# Patient Record
Sex: Female | Born: 1935
Health system: Southern US, Community
[De-identification: ages and names within clinical notes are randomized; demographics above are authoritative.]

## PROBLEM LIST (undated history)

## (undated) DIAGNOSIS — E785 Hyperlipidemia, unspecified: Secondary | ICD-10-CM

## (undated) DIAGNOSIS — I480 Paroxysmal atrial fibrillation: Secondary | ICD-10-CM

## (undated) DIAGNOSIS — I213 ST elevation (STEMI) myocardial infarction of unspecified site: Secondary | ICD-10-CM

## (undated) DIAGNOSIS — I2510 Atherosclerotic heart disease of native coronary artery without angina pectoris: Secondary | ICD-10-CM

## (undated) DIAGNOSIS — M1990 Unspecified osteoarthritis, unspecified site: Secondary | ICD-10-CM

## (undated) DIAGNOSIS — I509 Heart failure, unspecified: Secondary | ICD-10-CM

## (undated) DIAGNOSIS — I5021 Acute systolic (congestive) heart failure: Secondary | ICD-10-CM

## (undated) HISTORY — PX: CHOLECYSTECTOMY: SHX55

## (undated) HISTORY — PX: HERNIA REPAIR: SHX51

## (undated) HISTORY — PX: TUBAL LIGATION: SHX77

---

## 1998-05-13 ENCOUNTER — Other Ambulatory Visit: Admission: RE | Admit: 1998-05-13 | Discharge: 1998-05-13 | Payer: Self-pay | Admitting: *Deleted

## 1998-05-13 ENCOUNTER — Ambulatory Visit (HOSPITAL_COMMUNITY): Admission: RE | Admit: 1998-05-13 | Discharge: 1998-05-13 | Payer: Self-pay | Admitting: *Deleted

## 1999-05-05 ENCOUNTER — Ambulatory Visit (HOSPITAL_COMMUNITY): Admission: RE | Admit: 1999-05-05 | Discharge: 1999-05-05 | Payer: Self-pay | Admitting: *Deleted

## 2000-05-10 ENCOUNTER — Ambulatory Visit (HOSPITAL_COMMUNITY): Admission: RE | Admit: 2000-05-10 | Discharge: 2000-05-10 | Payer: Self-pay | Admitting: Family Medicine

## 2001-05-16 ENCOUNTER — Ambulatory Visit (HOSPITAL_COMMUNITY): Admission: RE | Admit: 2001-05-16 | Discharge: 2001-05-16 | Payer: Self-pay | Admitting: Obstetrics & Gynecology

## 2002-05-29 ENCOUNTER — Ambulatory Visit (HOSPITAL_COMMUNITY): Admission: RE | Admit: 2002-05-29 | Discharge: 2002-05-29 | Payer: Self-pay | Admitting: Family Medicine

## 2010-08-17 ENCOUNTER — Emergency Department (HOSPITAL_BASED_OUTPATIENT_CLINIC_OR_DEPARTMENT_OTHER): Admission: EM | Admit: 2010-08-17 | Payer: Self-pay | Source: Home / Self Care

## 2010-08-22 ENCOUNTER — Other Ambulatory Visit: Payer: Self-pay | Admitting: Surgery

## 2010-08-28 ENCOUNTER — Inpatient Hospital Stay: Admission: RE | Admit: 2010-08-28 | Payer: Self-pay | Source: Ambulatory Visit

## 2010-08-28 ENCOUNTER — Ambulatory Visit
Admission: RE | Admit: 2010-08-28 | Discharge: 2010-08-28 | Disposition: A | Discharge status: Home / Self Care | Payer: Medicare Other | Source: Ambulatory Visit | Attending: Surgery | Admitting: Surgery

## 2010-08-28 MED ORDER — IOHEXOL 300 MG/ML  SOLN
125.0000 mL | Freq: Once | INTRAMUSCULAR | Status: AC | PRN
  Administered 2010-08-28: 125 mL via INTRAVENOUS

## 2010-08-29 ENCOUNTER — Other Ambulatory Visit: Payer: Self-pay | Admitting: Surgery

## 2010-08-29 DIAGNOSIS — R918 Other nonspecific abnormal finding of lung field: Secondary | ICD-10-CM

## 2010-08-30 ENCOUNTER — Ambulatory Visit
Admission: RE | Admit: 2010-08-30 | Discharge: 2010-08-30 | Disposition: A | Discharge status: Home / Self Care | Payer: Medicare Other | Source: Ambulatory Visit | Attending: Surgery | Admitting: Surgery

## 2010-08-30 ENCOUNTER — Other Ambulatory Visit: Payer: Medicare Other

## 2010-08-30 DIAGNOSIS — R918 Other nonspecific abnormal finding of lung field: Secondary | ICD-10-CM

## 2010-08-30 MED ORDER — IOHEXOL 300 MG/ML  SOLN
75.0000 mL | Freq: Once | INTRAMUSCULAR | Status: AC | PRN
  Administered 2010-08-30: 75 mL via INTRAVENOUS

## 2010-10-23 ENCOUNTER — Other Ambulatory Visit: Payer: Self-pay | Admitting: Surgery

## 2010-10-23 ENCOUNTER — Encounter (HOSPITAL_COMMUNITY): Payer: Medicare Other | Attending: Surgery

## 2010-10-23 DIAGNOSIS — Z01812 Encounter for preprocedural laboratory examination: Secondary | ICD-10-CM | POA: Insufficient documentation

## 2010-10-23 DIAGNOSIS — K4190 Unilateral femoral hernia, without obstruction or gangrene, not specified as recurrent: Secondary | ICD-10-CM | POA: Insufficient documentation

## 2010-10-23 DIAGNOSIS — K439 Ventral hernia without obstruction or gangrene: Secondary | ICD-10-CM | POA: Insufficient documentation

## 2010-10-23 LAB — CBC
HCT: 44.5 % (ref 36.0–46.0)
MCH: 30.2 pg (ref 26.0–34.0)
MCV: 91.9 fL (ref 78.0–100.0)
Platelets: 238 10*3/uL (ref 150–400)
RBC: 4.84 MIL/uL (ref 3.87–5.11)
WBC: 7.2 10*3/uL (ref 4.0–10.5)

## 2010-10-23 LAB — DIFFERENTIAL
Eosinophils Absolute: 0.2 10*3/uL (ref 0.0–0.7)
Eosinophils Relative: 3 % (ref 0–5)
Lymphocytes Relative: 34 % (ref 12–46)
Lymphs Abs: 2.5 10*3/uL (ref 0.7–4.0)
Monocytes Absolute: 0.7 10*3/uL (ref 0.1–1.0)
Monocytes Relative: 10 % (ref 3–12)

## 2010-10-23 LAB — BASIC METABOLIC PANEL
BUN: 15 mg/dL (ref 6–23)
CO2: 29 mEq/L (ref 19–32)
Calcium: 9.3 mg/dL (ref 8.4–10.5)
Chloride: 107 mEq/L (ref 96–112)
Creatinine, Ser: 0.82 mg/dL (ref 0.4–1.2)
GFR calc Af Amer: >60 mL/min (ref >60)
Glucose, Bld: 82 mg/dL (ref 70–99)

## 2010-10-23 LAB — SURGICAL PCR SCREEN: MRSA, PCR: NEGATIVE

## 2010-11-03 ENCOUNTER — Inpatient Hospital Stay (HOSPITAL_COMMUNITY)
Admission: RE | Admit: 2010-11-03 | Discharge: 2010-11-07 | DRG: 352 | Disposition: A | Discharge status: Home / Self Care | Payer: Medicare Other | Source: Ambulatory Visit | Attending: Surgery | Admitting: Surgery

## 2010-11-03 DIAGNOSIS — K4091 Unilateral inguinal hernia, without obstruction or gangrene, recurrent: Secondary | ICD-10-CM | POA: Diagnosis present

## 2010-11-03 DIAGNOSIS — E669 Obesity, unspecified: Secondary | ICD-10-CM | POA: Diagnosis present

## 2010-11-03 DIAGNOSIS — G8918 Other acute postprocedural pain: Secondary | ICD-10-CM | POA: Diagnosis not present

## 2010-11-03 DIAGNOSIS — Z01812 Encounter for preprocedural laboratory examination: Secondary | ICD-10-CM

## 2010-11-03 DIAGNOSIS — K439 Ventral hernia without obstruction or gangrene: Principal | ICD-10-CM | POA: Diagnosis present

## 2010-11-03 NOTE — Op Note (Signed)
NAMESHAKEISHA, HORINE                ACCOUNT NO.:  [id number]  MEDICAL RECORD NO.:  [id number]           PATIENT TYPE:  I  LOCATION:  0002                         FACILITY:  [facility]  PHYSICIAN:  Wilmon Arms. Corliss Skains, M.D. DATE OF BIRTH:  1934/05/11  DATE OF PROCEDURE:  11/03/2010 DATE OF DISCHARGE:                              OPERATIVE REPORT   PREOPERATIVE DIAGNOSES: 1. Ventral hernias x2. 2. Left femoral hernia.  POSTOPERATIVE DIAGNOSES: 1. Ventral hernias x2. 2. Left direct inguinal hernia.  PROCEDURE:  Laparoscopic ventral hernia repair with mesh and a open left inguinal hernia repair with mesh.  SURGEON:  Wilmon Arms. Bryana Froemming, M.D.  ANESTHESIA:  General  INDICATIONS:  This is an otherwise healthy 77 year old lady who developed a mass in her epigastrium several years ago.  This has become fairly large and occasional uncomfortable.  She has also had some pain in her groin.  I could palpate a ventral hernia as well as a recurrence of a previous umbilical incision for tubal ligation.  I could not palpate any type of inguinal hernia, however.  A CT scan was obtained, which showed an epigastric ventral hernia, containing omental fat.  She also had a recurrent umbilical hernia containing fat.  She also had what was read as a left femoral hernia.  We recommended repair of all these hernias.  DESCRIPTION OF PROCEDURE:  The patient was brought to the operating room and placed in supine position on operating room table.  After an adequate level of general anesthesia was obtained, a Foley catheter was placed under sterile technique.  The patient's abdomen was prepped with ChloraPrep and her groin was prepped with Betadine and draped in sterile fashion.  Time-out was taken to identify proper patient, proper procedure.  We infiltrated the area below the left costal margin with 0.25% Marcaine with epinephrine.  5-mm OptiVu trocar was used to cannulate the peritoneal cavity.  We insufflated  CO2, maintaining maximal pressure of 15 mmHg.  The patient has minimal adhesions to the anterior abdominal wall.  An 11-mm port was placed in the left anterior axillary line at the level of the umbilicus.  Another 5-mm port was placed in left lower quadrant.  We first examined the epigastric hernia. This hernia defect was quite small, was right at the lower end of the falciform ligament.  However, this seemed to contain a lot of preperitoneal fat.  The umbilical defect also seemed fairly small and seems to contain a fair amount of preperitoneal fat as well.  No small bowel or omentum was actually involved with this area.  We placed the patient in Trendelenburg and examined her left groin.  She does not have a femoral hernia.  She seems to have a large direct inguinal hernia defect.  Due to the patient's body habitus and the location of her hernia, I made the decision to proceed with open repair after the laparoscopic ventral hernia repair.  We began working on the epigastric defect.  Using gentle traction as well as pressure from the external abdomen, we were able to reduce a large amount of preperitoneal fat out of the hernia  defect.  We cleared the falciform ligament away from this defect using harmonic scalpel. The defect only seemed to be about a centimeter and a half across but the hernia sac was about 4.5 to 5 cm across.  We then turned our attention to the umbilical defect.  We reduced a moderate amount of preperitoneal fat out of this hernia as well.  The hernia defect was about 2 cm across.  We measured the distance between these two defects in the midline and added several centimeters overlap each direction.  I then selected a 15 x 20 cm sheet of Physiomesh.  I trimmed this down so it was about 12 x 20 cm.  We placed 6 stay sutures of 0 Novafil around the edges of the mesh.  We rolled the mesh, inserted through the 11-mm trocar.  We unrolled the mesh and used the Endoclose  device to pull up the stay sutures.  This provided good coverage of both defects with good overlap.  The stay sutures were tied down.  Securestrap device was used to attach the mesh circumferentially.  Hemostasis was good.  We removed 11-mm trocar and closed the fascia with a 0 Vicryl.  We then removed our trocars as pneumoperitoneum was released.  We then turned our attention to left groin.  I made an oblique incision above the inguinal ligament. Dissection was carried down to the external oblique fascia.  The fascia was opened and a large hernia sac was identified.  We identified the round ligament which was ligated with 2-0 silk.  We were able to reduce the hernia sac back to direct defect.  We closed floor of inguinal canal with a running 0 Vicryl.  Ultrapro mesh was cut in an oval shape and secured with 2-0 Prolene, began at the pubic tubercle.  We ran this along the inguinal ligament inferiorly and the internal oblique fascia superiorly.  The lateral end of the mesh was tucked underneath the external oblique fascia laterally.  The fascia was reapproximated using 2-0 Vicryl.  3-0 Vicryl sutures was used to close subcutaneous tissues and 4-0 Monocryl was used to close all of the skin incisions.  A small stab incision with stay sutures were closed with Dermabond.  Steri- Strips and Benzoin were applied.  The patient was extubated and brought to recovery in stable condition.  All sponge, instrument, needle counts were correct.     Wilmon Arms. Corliss Skains, M.D.     MKT/MEDQ  D:  11/03/2010  T:  11/03/2010  Job:  [id number]  Electronically Signed by Manus Rudd M.D. on 11/03/2010 02:49:33 PM

## 2010-11-08 NOTE — Discharge Summary (Signed)
  [name], [name]                ACCOUNT NO.:  [id number]  MEDICAL RECORD NO.:  [id number]           PATIENT TYPE:  O  LOCATION:  1533                         FACILITY:  [facility]  PHYSICIAN:  Wilmon Arms. Corliss Skains, M.D. DATE OF BIRTH:  05/01/1934  DATE OF ADMISSION:  11/03/2010 DATE OF DISCHARGE:  11/07/2010                              DISCHARGE SUMMARY   ADMISSION DIAGNOSIS:  Ventral hernias x2, left inguinal hernia.  DISCHARGE DIAGNOSIS:  Ventral hernias x2, left inguinal hernia.  PROCEDURE:  Laparoscopic ventral hernia repair with mesh and left inguinal hernia repair with mesh.  BRIEF HISTORY:  This is a healthy 77 year old female who has had a previous umbilical hernia repair.  She developed a mass in her epigastrium several years ago, which has enlarged and occasionally becomes quite painful.  Felt that she had epigastric hernia.  A CT scan showed that she also had a recurrent umbilical hernia as well as a hernia in her left groin.  Initially, this was described as a femoral hernia.  HOSPITAL COURSE:  The patient underwent laparoscopic ventral hernia repair with mesh on Nov 03, 2010.  She had a small defect in her epigastrium, containing a lot of preperitoneal fat.  She also had another hernia at her umbilicus.  These were both repaired with a single sheet of Physiomesh.  We examined the left groin and she had a large direct hernia defect.  We repaired this through open technique in the left groin.  The patient was then admitted to the hospital.  She has been in hospital for last several days, mostly for pain control.  She has been afebrile with stable vital signs.  Her incisions look good with no sign of infection.  She is tolerating a regular diet here.  She has had some small bowel movement.  Her incisions all look like they are healing well with no sign of infection.  DISCHARGE INSTRUCTIONS:  The patient may use a heating as needed.  She should wear abdominal binder when  she is up out of bed for support.  She is given Percocet p.r.n. for pain.  She may take ibuprofen as well as the Percocet.  She should refrain from any heavy lifting.  Follow up in 2 to 3 weeks.     Wilmon Arms. Corliss Skains, M.D.     MKT/MEDQ  D:  11/07/2010  T:  11/07/2010  Job:  [id number]  Electronically Signed by Manus Rudd M.D. on 11/08/2010 07:57:42 AM

## 2011-08-08 ENCOUNTER — Emergency Department (INDEPENDENT_AMBULATORY_CARE_PROVIDER_SITE_OTHER): Payer: Medicare Other

## 2011-08-08 ENCOUNTER — Other Ambulatory Visit: Payer: Self-pay

## 2011-08-08 ENCOUNTER — Inpatient Hospital Stay (HOSPITAL_BASED_OUTPATIENT_CLINIC_OR_DEPARTMENT_OTHER)
Admission: EM | Admit: 2011-08-08 | Discharge: 2011-08-11 | DRG: 419 | Disposition: A | Discharge status: Home / Self Care | Payer: Medicare Other | Attending: Surgery | Admitting: Surgery

## 2011-08-08 ENCOUNTER — Encounter (HOSPITAL_BASED_OUTPATIENT_CLINIC_OR_DEPARTMENT_OTHER): Payer: Self-pay | Admitting: Emergency Medicine

## 2011-08-08 DIAGNOSIS — K8010 Calculus of gallbladder with chronic cholecystitis without obstruction: Secondary | ICD-10-CM | POA: Diagnosis present

## 2011-08-08 DIAGNOSIS — R110 Nausea: Secondary | ICD-10-CM

## 2011-08-08 DIAGNOSIS — R911 Solitary pulmonary nodule: Secondary | ICD-10-CM

## 2011-08-08 DIAGNOSIS — K810 Acute cholecystitis: Secondary | ICD-10-CM

## 2011-08-08 DIAGNOSIS — R112 Nausea with vomiting, unspecified: Secondary | ICD-10-CM | POA: Diagnosis present

## 2011-08-08 DIAGNOSIS — R109 Unspecified abdominal pain: Secondary | ICD-10-CM

## 2011-08-08 DIAGNOSIS — R1011 Right upper quadrant pain: Secondary | ICD-10-CM | POA: Diagnosis present

## 2011-08-08 DIAGNOSIS — E669 Obesity, unspecified: Secondary | ICD-10-CM

## 2011-08-08 DIAGNOSIS — J9819 Other pulmonary collapse: Secondary | ICD-10-CM

## 2011-08-08 DIAGNOSIS — K8000 Calculus of gallbladder with acute cholecystitis without obstruction: Principal | ICD-10-CM | POA: Diagnosis present

## 2011-08-08 LAB — COMPREHENSIVE METABOLIC PANEL
Albumin: 3.9 g/dL (ref 3.5–5.2)
Alkaline Phosphatase: 82 U/L (ref 39–117)
BUN: 22 mg/dL (ref 6–23)
Creatinine, Ser: 0.6 mg/dL (ref 0.50–1.10)
GFR calc Af Amer: >90 mL/min (ref >90)
Glucose, Bld: 152 mg/dL — ABNORMAL HIGH (ref 70–99)
Potassium: 4.3 mEq/L (ref 3.5–5.1)
Total Protein: 7.5 g/dL (ref 6.0–8.3)

## 2011-08-08 LAB — DIFFERENTIAL
Basophils Absolute: 0 10*3/uL (ref 0.0–0.1)
Eosinophils Absolute: 0 10*3/uL (ref 0.0–0.7)
Eosinophils Relative: 0 % (ref 0–5)
Lymphocytes Relative: 9 % — ABNORMAL LOW (ref 12–46)
Neutrophils Relative %: 84 % — ABNORMAL HIGH (ref 43–77)

## 2011-08-08 LAB — CBC
HCT: 41.2 % (ref 36.0–46.0)
Hemoglobin: 14 g/dL (ref 12.0–15.0)
MCH: 30.6 pg (ref 26.0–34.0)
MCHC: 34 g/dL (ref 30.0–36.0)
MCV: 90.2 fL (ref 78.0–100.0)
RDW: 15.4 % (ref 11.5–15.5)

## 2011-08-08 LAB — URINALYSIS, ROUTINE W REFLEX MICROSCOPIC
Ketones, ur: 15 mg/dL — AB
Leukocytes, UA: NEGATIVE
Nitrite: NEGATIVE
Protein, ur: NEGATIVE mg/dL
pH: 5.5 (ref 5.0–8.0)

## 2011-08-08 LAB — LIPASE, BLOOD: Lipase: 16 U/L (ref 11–59)

## 2011-08-08 MED ORDER — ONDANSETRON HCL 4 MG/2ML IJ SOLN
4.0000 mg | Freq: Four times a day (QID) | INTRAMUSCULAR | Status: DC | PRN
  Administered 2011-08-08: 4 mg via INTRAVENOUS
  Filled 2011-08-08: qty 2

## 2011-08-08 MED ORDER — MORPHINE SULFATE 2 MG/ML IJ SOLN
2.0000 mg | Freq: Once | INTRAMUSCULAR | Status: AC
  Administered 2011-08-08: 2 mg via INTRAVENOUS

## 2011-08-08 MED ORDER — MORPHINE SULFATE 2 MG/ML IJ SOLN
INTRAMUSCULAR | Status: AC
  Filled 2011-08-08: qty 1

## 2011-08-08 MED ORDER — KCL IN DEXTROSE-NACL 20-5-0.45 MEQ/L-%-% IV SOLN
INTRAVENOUS | Status: DC
  Administered 2011-08-08 – 2011-08-11 (×6): via INTRAVENOUS
  Filled 2011-08-08 (×11): qty 1000

## 2011-08-08 MED ORDER — ONDANSETRON HCL 4 MG/2ML IJ SOLN
INTRAMUSCULAR | Status: AC
  Filled 2011-08-08: qty 2

## 2011-08-08 MED ORDER — IOHEXOL 300 MG/ML  SOLN
100.0000 mL | Freq: Once | INTRAMUSCULAR | Status: AC | PRN
  Administered 2011-08-08: 100 mL via INTRAVENOUS

## 2011-08-08 MED ORDER — MORPHINE SULFATE 2 MG/ML IJ SOLN
2.0000 mg | INTRAMUSCULAR | Status: DC | PRN
  Administered 2011-08-08 – 2011-08-09 (×4): 2 mg via INTRAVENOUS
  Administered 2011-08-10: 1 mg via INTRAVENOUS
  Administered 2011-08-10: 2 mg via INTRAVENOUS
  Filled 2011-08-08 (×6): qty 1

## 2011-08-08 MED ORDER — MORPHINE SULFATE 4 MG/ML IJ SOLN: 2.0000 mg | Freq: Once | INTRAMUSCULAR | Status: DC

## 2011-08-08 MED ORDER — ONDANSETRON HCL 4 MG/2ML IJ SOLN: 4.0000 mg | Freq: Once | INTRAMUSCULAR | Status: DC

## 2011-08-08 MED ORDER — PIPERACILLIN-TAZOBACTAM 3.375 G IVPB
3.3750 g | Freq: Once | INTRAVENOUS | Status: AC
  Administered 2011-08-08: 3.375 g via INTRAVENOUS
  Filled 2011-08-08: qty 50

## 2011-08-08 MED ORDER — ONDANSETRON HCL 4 MG/2ML IJ SOLN
4.0000 mg | Freq: Once | INTRAMUSCULAR | Status: AC
  Administered 2011-08-08: 4 mg via INTRAVENOUS

## 2011-08-08 MED ORDER — SODIUM CHLORIDE 0.9 % IV BOLUS (SEPSIS)
1000.0000 mL | Freq: Once | INTRAVENOUS | Status: AC
  Administered 2011-08-08: 1000 mL via INTRAVENOUS

## 2011-08-08 MED ORDER — IOHEXOL 300 MG/ML  SOLN
20.0000 mL | INTRAMUSCULAR | Status: DC
  Administered 2011-08-08: 20 mL via ORAL

## 2011-08-08 MED ORDER — CIPROFLOXACIN IN D5W 400 MG/200ML IV SOLN
400.0000 mg | Freq: Two times a day (BID) | INTRAVENOUS | Status: DC
  Administered 2011-08-08 – 2011-08-11 (×4): 400 mg via INTRAVENOUS
  Filled 2011-08-08 (×7): qty 200

## 2011-08-08 MED ORDER — SODIUM CHLORIDE 0.9 % IV SOLN: 999.0000 mL | INTRAVENOUS | Status: DC

## 2011-08-08 NOTE — ED Notes (Signed)
Pt c/o mid upper abd pain w/ NV since 0130

## 2011-08-08 NOTE — ED Provider Notes (Signed)
History     CSN: [id number]  Arrival date & time 08/08/11  1446   First MD Initiated Contact with Patient 08/08/11 1643      Chief Complaint  Patient presents with  . Abdominal Pain    (Consider location/radiation/quality/duration/timing/severity/associated sxs/prior treatment) Patient is a 78 y.o. female presenting with abdominal pain. The history is provided by the patient. No language interpreter was used.  Abdominal Pain The primary symptoms of the illness include abdominal pain, nausea and vomiting. The current episode started yesterday. The onset of the illness was sudden. The problem has been gradually worsening.  Associated with: vomitting. The patient has not had a change in bowel habit. Risk factors for an acute abdominal problem include being elderly and a history of abdominal surgery. Symptoms associated with the illness do not include chills or back pain. Significant associated medical issues do not include diverticulitis or cardiac disease.  Pt complains of pain that started at 1am.  Pt reports she ate at [employer] last pm. Pt reports she has vomitted approx 30 times  History reviewed. No pertinent past medical history.  Past Surgical History  Procedure Date  . Hernia repair   . Tubal ligation     No family history on file.  History  Substance Use Topics  . Smoking status: Never Smoker   . Smokeless tobacco: Not on file  . Alcohol Use: No    OB History    Grav Para Term Preterm Abortions TAB SAB Ect Mult Living                  Review of Systems  Constitutional: Negative for chills.  Gastrointestinal: Positive for nausea, vomiting and abdominal pain.  Musculoskeletal: Negative for back pain.  All other systems reviewed and are negative.    Allergies  Review of patient's allergies indicates no known allergies.  Home Medications   Current Outpatient Rx  Name Route Sig Dispense Refill  . CALCIUM CARBONATE 600 MG PO TABS Oral Take 600 mg by mouth  daily.    Marland Kitchen VITAMIN D-3 5000 UNITS PO TABS Oral Take 1 tablet by mouth daily.    Marland Kitchen GARLIC 1000 MG PO CAPS Oral Take 1 capsule by mouth daily.    Marland Kitchen GINKGO BILOBA 40 MG PO TABS Oral Take 1 tablet by mouth daily.    . LUTEIN 20 MG PO CAPS Oral Take 1 capsule by mouth daily.    . ADULT MULTIVITAMIN W/MINERALS CH Oral Take 1 tablet by mouth daily.    . OIL OF OREGANO 1500 MG PO CAPS Oral Take 1 capsule by mouth daily.    Marland Kitchen SPIRULINA 500 MG PO TABS Oral Take 1 tablet by mouth daily.    . TURMERIC 450 MG PO CAPS Oral Take 1 capsule by mouth daily.      BP 137/72  Pulse 87  Temp(Src) 99.2 F (37.3 C) (Oral)  Resp 20  Ht 5\' 4"  (1.626 m)  Wt 195 lb (88.451 kg)  BMI 33.47 kg/m2  SpO2 96%  Physical Exam  Vitals reviewed. Constitutional: She is oriented to person, place, and time. She appears well-developed and well-nourished.  HENT:  Head: Normocephalic and atraumatic.  Eyes: Conjunctivae are normal. Pupils are equal, round, and reactive to light.  Neck: Normal range of motion.  Cardiovascular: Normal rate.   Pulmonary/Chest: Effort normal and breath sounds normal.  Abdominal: Soft. There is tenderness. There is guarding.  Musculoskeletal: Normal range of motion.  Neurological: She is alert and oriented to  person, place, and time. She has normal reflexes.  Skin: Skin is warm.  Psychiatric: She has a normal mood and affect.    ED Course  Procedures (including critical care time)  Labs Reviewed  CBC - Abnormal; Notable for the following:    WBC 14.6 (*)    All other components within normal limits  COMPREHENSIVE METABOLIC PANEL - Abnormal; Notable for the following:    Glucose, Bld 152 (*)    GFR calc non Af Amer 87 (*)    All other components within normal limits  URINALYSIS, ROUTINE W REFLEX MICROSCOPIC  COMPREHENSIVE METABOLIC PANEL  CBC  DIFFERENTIAL  LIPASE, BLOOD  URINALYSIS, ROUTINE W REFLEX MICROSCOPIC   No results found.   No diagnosis found.    MDM  Pt given  Iv fluids,  Pt reports decreased nausea after zofran.  Pt given medication for pain,   Ct scan shows gallbladder disease.  Pt counseled on results  Date: 08/08/2011  Rate: 75  Rhythm: normal sinus rhythm  QRS Axis: normal  Intervals: normal  ST/T Wave abnormalities: normal  Conduction Disutrbances:none  Narrative Interpretation:   Old EKG Reviewed: none available    Results for orders placed during the hospital encounter of 08/08/11  CBC      Component Value Range   WBC 14.6 (*) 4.0 - 10.5 (K/uL)   RBC 4.57  3.87 - 5.11 (MIL/uL)   Hemoglobin 14.0  12.0 - 15.0 (g/dL)   HCT 16.1  09.6 - 04.5 (%)   MCV 90.2  78.0 - 100.0 (fL)   MCH 30.6  26.0 - 34.0 (pg)   MCHC 34.0  30.0 - 36.0 (g/dL)   RDW 40.9  81.1 - 91.4 (%)   Platelets 241  150 - 400 (K/uL)  COMPREHENSIVE METABOLIC PANEL      Component Value Range   Sodium 138  135 - 145 (mEq/L)   Potassium 4.3  3.5 - 5.1 (mEq/L)   Chloride 101  96 - 112 (mEq/L)   CO2 25  19 - 32 (mEq/L)   Glucose, Bld 152 (*) 70 - 99 (mg/dL)   BUN 22  6 - 23 (mg/dL)   Creatinine, Ser 7.82  0.50 - 1.10 (mg/dL)   Calcium 9.6  8.4 - 95.6 (mg/dL)   Total Protein 7.5  6.0 - 8.3 (g/dL)   Albumin 3.9  3.5 - 5.2 (g/dL)   AST 24  0 - 37 (U/L)   ALT 16  0 - 35 (U/L)   Alkaline Phosphatase 82  39 - 117 (U/L)   Total Bilirubin 0.3  0.3 - 1.2 (mg/dL)   GFR calc non Af Amer 87 (*) >90 (mL/min)   GFR calc Af Amer >90  >90 (mL/min)  URINALYSIS, ROUTINE W REFLEX MICROSCOPIC      Component Value Range   Color, Urine YELLOW  YELLOW    APPearance CLEAR  CLEAR    Specific Gravity, Urine 1.029  1.005 - 1.030    pH 5.5  5.0 - 8.0    Glucose, UA NEGATIVE  NEGATIVE (mg/dL)   Hgb urine dipstick NEGATIVE  NEGATIVE    Bilirubin Urine NEGATIVE  NEGATIVE    Ketones, ur 15 (*) NEGATIVE (mg/dL)   Protein, ur NEGATIVE  NEGATIVE (mg/dL)   Urobilinogen, UA 0.2  0.0 - 1.0 (mg/dL)   Nitrite NEGATIVE  NEGATIVE    Leukocytes, UA NEGATIVE  NEGATIVE   DIFFERENTIAL      Component  Value Range   Neutrophils Relative 84 (*) 43 -  77 (%)   Neutro Abs 12.1 (*) 1.7 - 7.7 (K/uL)   Lymphocytes Relative 9 (*) 12 - 46 (%)   Lymphs Abs 1.3  0.7 - 4.0 (K/uL)   Monocytes Relative 7  3 - 12 (%)   Monocytes Absolute 1.1 (*) 0.1 - 1.0 (K/uL)   Eosinophils Relative 0  0 - 5 (%)   Eosinophils Absolute 0.0  0.0 - 0.7 (K/uL)   Basophils Relative 0  0 - 1 (%)   Basophils Absolute 0.0  0.0 - 0.1 (K/uL)  LIPASE, BLOOD      Component Value Range   Lipase 16  11 - 59 (U/L)   Ct Abdomen Pelvis W Contrast  08/08/2011  *RADIOLOGY REPORT*  Clinical Data: Abdominal pain, nausea, vomiting.  CT ABDOMEN AND PELVIS WITH CONTRAST  Technique:  Multidetector CT imaging of the abdomen and pelvis was performed following the standard protocol during bolus administration of intravenous contrast.  Contrast: OMNIPAQUE IOHEXOL 300 MG/ML IV SOLN  Comparison: 08/28/2010  Findings: Right base atelectasis.  No effusions.  Heart is upper limits normal in size.  Coronary artery calcifications.  Gallbladder is distended with surrounding inflammation, wall thickening, pericholecystic fluid and perihepatic ascites. Findings concerning for acute cholecystitis.  No biliary ductal dilatation.  Elongated low density lesion within the pancreatic body measures up to 2 cm on image 30.  This was not as easily visualized on prior study when measured approximately 14 mm in greatest diameter.  Low-density lesions within the liver, likely cysts.  Spleen, adrenals are unremarkable.  Bilateral renal parapelvic cysts are stable.  No hydronephrosis.  Large and small bowel are unremarkable.  No free air or adenopathy. Uterus, urinary bladder and adnexa unremarkable.  No acute bony abnormality.  IMPRESSION: Distended, inflamed gallbladder with wall thickening and pericholecystic fluid concerning for acute cholecystitis.  Small amount of perihepatic ascites.  Right lower lobe atelectasis.  2 cm elongated low density lesion within the  pancreatic body, slightly larger than prior study.  This could be further characterized with pancreatic protocol MRI.  Original Report Authenticated By: Cyndie Chime, M.D.       Langston Masker, PA 08/08/11 [id number]

## 2011-08-08 NOTE — H&P (Signed)
Reason for Consult:cholecystitis Referring Physician: Dr. Lonny Prude [name] is an 78 y.o. [name].  HPI: this patient presented earlier to [facility] for evaluation of upper abdominal pain and nausea and vomiting. She states that she was in her usual, excellent state of health until last night at 1:30 in the morning she began having nausea and vomiting and constant upper abdominal pain. She states that she ate some steak and potato and french fries and for dinner and she was awoken from her sleep with nausea and abdominal pain. She had what she describes as "constant" abdominal pain across her upper abdomen with several episodes of nonbilious and nonbloody emesis. She took Pepto-Bismol without any relief and stayed in bed until this afternoon thinking that this would improve. She has had consistent and continuous pain since this began although she states that her pain is now improved with pain medication. She denies any fevers or chills states that her bowels are normal without any blood in the stools. She denies any reflux symptoms. She says she's currently her colonoscopy but she states that this was done approximately 10 years ago.  History reviewed. No pertinent past medical history.  Past Surgical History  Procedure Date  . Hernia repair   . Tubal ligation     No family history on file.  Social History:  reports that she has never smoked. She does not have any smokeless tobacco history on file. She reports that she does not drink alcohol. Her drug history not on file.  Allergies: No Known Allergies  Medications: I have reviewed the patient's current medications.  Results for orders placed during the hospital encounter of 08/08/11 (from the past 48 hour(s))  CBC     Status: Abnormal   Collection Time   08/08/11  3:30 PM      Component Value Range Comment   WBC 14.6 (*) 4.0 - 10.5 (K/uL)    RBC 4.57  3.87 - 5.11 (MIL/uL)    Hemoglobin 14.0  12.0 - 15.0 (g/dL)    HCT  69.6  29.5 - 28.4 (%)    MCV 90.2  78.0 - 100.0 (fL)    MCH 30.6  26.0 - 34.0 (pg)    MCHC 34.0  30.0 - 36.0 (g/dL)    RDW 13.2  44.0 - 10.2 (%)    Platelets 241  150 - 400 (K/uL)   COMPREHENSIVE METABOLIC PANEL     Status: Abnormal   Collection Time   08/08/11  3:30 PM      Component Value Range Comment   Sodium 138  135 - 145 (mEq/L)    Potassium 4.3  3.5 - 5.1 (mEq/L)    Chloride 101  96 - 112 (mEq/L)    CO2 25  19 - 32 (mEq/L)    Glucose, Bld 152 (*) 70 - 99 (mg/dL)    BUN 22  6 - 23 (mg/dL)    Creatinine, Ser 7.25  0.50 - 1.10 (mg/dL)    Calcium 9.6  8.4 - 10.5 (mg/dL)    Total Protein 7.5  6.0 - 8.3 (g/dL)    Albumin 3.9  3.5 - 5.2 (g/dL)    AST 24  0 - 37 (U/L)    ALT 16  0 - 35 (U/L)    Alkaline Phosphatase 82  39 - 117 (U/L)    Total Bilirubin 0.3  0.3 - 1.2 (mg/dL)    GFR calc non Af Amer 87 (*) >90 (mL/min)    GFR  calc Af Amer >90  >90 (mL/min)   DIFFERENTIAL     Status: Abnormal   Collection Time   08/08/11  3:30 PM      Component Value Range Comment   Neutrophils Relative 84 (*) 43 - 77 (%)    Neutro Abs 12.1 (*) 1.7 - 7.7 (K/uL)    Lymphocytes Relative 9 (*) 12 - 46 (%)    Lymphs Abs 1.3  0.7 - 4.0 (K/uL)    Monocytes Relative 7  3 - 12 (%)    Monocytes Absolute 1.1 (*) 0.1 - 1.0 (K/uL)    Eosinophils Relative 0  0 - 5 (%)    Eosinophils Absolute 0.0  0.0 - 0.7 (K/uL)    Basophils Relative 0  0 - 1 (%)    Basophils Absolute 0.0  0.0 - 0.1 (K/uL)   LIPASE, BLOOD     Status: Normal   Collection Time   08/08/11  3:30 PM      Component Value Range Comment   Lipase 16  11 - 59 (U/L)   URINALYSIS, ROUTINE W REFLEX MICROSCOPIC     Status: Abnormal   Collection Time   08/08/11  6:00 PM      Component Value Range Comment   Color, Urine YELLOW  YELLOW     APPearance CLEAR  CLEAR     Specific Gravity, Urine 1.029  1.005 - 1.030     pH 5.5  5.0 - 8.0     Glucose, UA NEGATIVE  NEGATIVE (mg/dL)    Hgb urine dipstick NEGATIVE  NEGATIVE     Bilirubin Urine NEGATIVE   NEGATIVE     Ketones, ur 15 (*) NEGATIVE (mg/dL)    Protein, ur NEGATIVE  NEGATIVE (mg/dL)    Urobilinogen, UA 0.2  0.0 - 1.0 (mg/dL)    Nitrite NEGATIVE  NEGATIVE     Leukocytes, UA NEGATIVE  NEGATIVE  MICROSCOPIC NOT DONE ON URINES WITH NEGATIVE PROTEIN, BLOOD, LEUKOCYTES, NITRITE, OR GLUCOSE <1000 mg/dL.    Dg Chest 2 View  08/08/2011  *RADIOLOGY REPORT*  Clinical Data: Abdominal pain, nausea  CHEST - 2 VIEW  Comparison: 08/30/2010  Findings: 7 mm nodule in the lateral right lower lobe, stable since prior scan.  Minimal subsegmental atelectasis or scarring in the inferior lingula as before.  Heart size normal.  No effusion. Tortuous atheromatous thoracic aorta.  Regional bones unremarkable.  IMPRESSION:  1.  No definite change in the right lower lobe nodule. 2.  No acute disease.  Original Report Authenticated By: Osa Craver, M.D.   Ct Abdomen Pelvis W Contrast  08/08/2011  *RADIOLOGY REPORT*  Clinical Data: Abdominal pain, nausea, vomiting.  CT ABDOMEN AND PELVIS WITH CONTRAST  Technique:  Multidetector CT imaging of the abdomen and pelvis was performed following the standard protocol during bolus administration of intravenous contrast.  Contrast: OMNIPAQUE IOHEXOL 300 MG/ML IV SOLN  Comparison: 08/28/2010  Findings: Right base atelectasis.  No effusions.  Heart is upper limits normal in size.  Coronary artery calcifications.  Gallbladder is distended with surrounding inflammation, wall thickening, pericholecystic fluid and perihepatic ascites. Findings concerning for acute cholecystitis.  No biliary ductal dilatation.  Elongated low density lesion within the pancreatic body measures up to 2 cm on image 30.  This was not as easily visualized on prior study when measured approximately 14 mm in greatest diameter.  Low-density lesions within the liver, likely cysts.  Spleen, adrenals are unremarkable.  Bilateral renal parapelvic cysts are stable.  No hydronephrosis.  Large and small  bowel are unremarkable.  No free air or adenopathy. Uterus, urinary bladder and adnexa unremarkable.  No acute bony abnormality.  IMPRESSION: Distended, inflamed gallbladder with wall thickening and pericholecystic fluid concerning for acute cholecystitis.  Small amount of perihepatic ascites.  Right lower lobe atelectasis.  2 cm elongated low density lesion within the pancreatic body, slightly larger than prior study.  This could be further characterized with pancreatic protocol MRI.  Original Report Authenticated By: Cyndie Chime, M.D.    @ROS @ Blood pressure 113/69, pulse 90, temperature 99 F (37.2 C), temperature source Oral, resp. rate 16, height 5\' 4"  (1.626 m), weight 195 lb (88.451 kg), SpO2 93.00%. General appearance: alert, cooperative and no distress Head: Normocephalic, without obvious abnormality, atraumatic Neck: no JVD and supple, symmetrical, trachea midline Resp: nonlabored  Cardio: normal rate, regular rhythm GI: soft, mild to moderate RUQ and RLQ tenderness, ND, no peritoneal signs Extremities: extremities normal, atraumatic, no cyanosis or edema Pulses: 2+ and symmetric Neurologic: Grossly normal  Assessment/Plan: Right upper quadrant and right-sided abdominal pain She has a history and physical exam and CT scan which are concerning for acute cholecystitis. There are not visible gallstones on the CT scan she has thickening of the wall with some pericholecystic fluid and concern for cholecystitis. She is otherwise healthy and I think would tolerate surgery without difficulty. I discussed with her the risks of the surgery and the options of antibiotic management and nonoperative treatment versus surgery.  I think that given her elevated white blood cell count and her CT scan concerning for cholecystitis with her symptoms, she would likely benefit from cholecystectomy.I did discuss with her the risks of surgery including infection, bleeding, pain, scarring, need for open  surgery, injury to bowel or bile ducts and persistent symptoms she expressed understanding. We will plan for IV hydration and n.p.o. And antibiotic management. We'll plan for cholecystectomy when available.  [name] DAVID 08/08/2011, 11:35 PM

## 2011-08-09 ENCOUNTER — Encounter (HOSPITAL_COMMUNITY): Payer: Self-pay

## 2011-08-09 ENCOUNTER — Encounter (HOSPITAL_COMMUNITY): Payer: Self-pay | Admitting: Anesthesiology

## 2011-08-09 ENCOUNTER — Inpatient Hospital Stay (HOSPITAL_COMMUNITY): Payer: Medicare Other

## 2011-08-09 ENCOUNTER — Other Ambulatory Visit (INDEPENDENT_AMBULATORY_CARE_PROVIDER_SITE_OTHER): Payer: Self-pay | Admitting: General Surgery

## 2011-08-09 ENCOUNTER — Inpatient Hospital Stay (HOSPITAL_COMMUNITY): Payer: Medicare Other | Admitting: Anesthesiology

## 2011-08-09 ENCOUNTER — Encounter (HOSPITAL_COMMUNITY): Admission: EM | Disposition: A | Discharge status: Home / Self Care | Payer: Self-pay | Source: Home / Self Care

## 2011-08-09 DIAGNOSIS — R109 Unspecified abdominal pain: Secondary | ICD-10-CM

## 2011-08-09 HISTORY — PX: CHOLECYSTECTOMY: SHX55

## 2011-08-09 LAB — COMPREHENSIVE METABOLIC PANEL
ALT: 16 U/L (ref 0–35)
AST: 23 U/L (ref 0–37)
Alkaline Phosphatase: 67 U/L (ref 39–117)
CO2: 25 mEq/L (ref 19–32)
Chloride: 101 mEq/L (ref 96–112)
Creatinine, Ser: 0.69 mg/dL (ref 0.50–1.10)
GFR calc non Af Amer: 83 mL/min — ABNORMAL LOW (ref >90)
Potassium: 3.5 mEq/L (ref 3.5–5.1)
Total Bilirubin: 0.5 mg/dL (ref 0.3–1.2)

## 2011-08-09 LAB — SURGICAL PCR SCREEN: MRSA, PCR: NEGATIVE

## 2011-08-09 LAB — CBC
MCH: 29.9 pg (ref 26.0–34.0)
MCHC: 32.4 g/dL (ref 30.0–36.0)
Platelets: 213 10*3/uL (ref 150–400)
RDW: 15.5 % (ref 11.5–15.5)

## 2011-08-09 SURGERY — LAPAROSCOPIC CHOLECYSTECTOMY
Anesthesia: General | Site: Abdomen | Wound class: Clean Contaminated

## 2011-08-09 MED ORDER — CIPROFLOXACIN IN D5W 400 MG/200ML IV SOLN
INTRAVENOUS | Status: DC | PRN
  Administered 2011-08-09: 400 mg via INTRAVENOUS

## 2011-08-09 MED ORDER — PROMETHAZINE HCL 25 MG/ML IJ SOLN: 6.2500 mg | INTRAMUSCULAR | Status: DC | PRN

## 2011-08-09 MED ORDER — EPHEDRINE SULFATE 50 MG/ML IJ SOLN
INTRAMUSCULAR | Status: DC | PRN
  Administered 2011-08-09: 10 mg via INTRAVENOUS

## 2011-08-09 MED ORDER — LACTATED RINGERS IV SOLN
INTRAVENOUS | Status: DC | PRN
  Administered 2011-08-09 (×2): via INTRAVENOUS

## 2011-08-09 MED ORDER — LACTATED RINGERS IV SOLN: INTRAVENOUS | Status: DC

## 2011-08-09 MED ORDER — NEOSTIGMINE METHYLSULFATE 1 MG/ML IJ SOLN
INTRAMUSCULAR | Status: DC | PRN
  Administered 2011-08-09: 4 mg via INTRAVENOUS

## 2011-08-09 MED ORDER — LIDOCAINE HCL (CARDIAC) 20 MG/ML IV SOLN
INTRAVENOUS | Status: DC | PRN
  Administered 2011-08-09: 50 mg via INTRAVENOUS

## 2011-08-09 MED ORDER — PROPOFOL 10 MG/ML IV EMUL
INTRAVENOUS | Status: DC | PRN
  Administered 2011-08-09: 150 mg via INTRAVENOUS

## 2011-08-09 MED ORDER — BUPIVACAINE-EPINEPHRINE 0.25% -1:200000 IJ SOLN
INTRAMUSCULAR | Status: DC | PRN
  Administered 2011-08-09: 30 mL

## 2011-08-09 MED ORDER — CHLORHEXIDINE GLUCONATE 0.12 % MT SOLN
15.0000 mL | Freq: Two times a day (BID) | OROMUCOSAL | Status: DC
  Administered 2011-08-09 – 2011-08-11 (×4): 15 mL via OROMUCOSAL
  Filled 2011-08-09 (×6): qty 15

## 2011-08-09 MED ORDER — MIDAZOLAM HCL 5 MG/5ML IJ SOLN
INTRAMUSCULAR | Status: DC | PRN
  Administered 2011-08-09 (×2): 1 mg via INTRAVENOUS

## 2011-08-09 MED ORDER — FENTANYL CITRATE 0.05 MG/ML IJ SOLN
25.0000 ug | INTRAMUSCULAR | Status: DC | PRN
  Administered 2011-08-09: 50 ug via INTRAVENOUS

## 2011-08-09 MED ORDER — LACTATED RINGERS IV SOLN
INTRAVENOUS | Status: DC | PRN
  Administered 2011-08-09: 1000 mL via INTRAVENOUS

## 2011-08-09 MED ORDER — SUCCINYLCHOLINE CHLORIDE 20 MG/ML IJ SOLN
INTRAMUSCULAR | Status: DC | PRN
  Administered 2011-08-09: 100 mg via INTRAVENOUS

## 2011-08-09 MED ORDER — ACETAMINOPHEN 10 MG/ML IV SOLN
INTRAVENOUS | Status: DC | PRN
  Administered 2011-08-09: 1000 mg via INTRAVENOUS

## 2011-08-09 MED ORDER — CISATRACURIUM BESYLATE 2 MG/ML IV SOLN
INTRAVENOUS | Status: DC | PRN
  Administered 2011-08-09: 4 mg via INTRAVENOUS
  Administered 2011-08-09 (×2): 2 mg via INTRAVENOUS

## 2011-08-09 MED ORDER — GLYCOPYRROLATE 0.2 MG/ML IJ SOLN
INTRAMUSCULAR | Status: DC | PRN
  Administered 2011-08-09: .6 mg via INTRAVENOUS

## 2011-08-09 MED ORDER — ONDANSETRON HCL 4 MG/2ML IJ SOLN
INTRAMUSCULAR | Status: DC | PRN
  Administered 2011-08-09 (×2): 2 mg via INTRAVENOUS

## 2011-08-09 MED ORDER — FENTANYL CITRATE 0.05 MG/ML IJ SOLN
INTRAMUSCULAR | Status: DC | PRN
  Administered 2011-08-09 (×4): 50 ug via INTRAVENOUS
  Administered 2011-08-09: 100 ug via INTRAVENOUS

## 2011-08-09 MED ORDER — BIOTENE DRY MOUTH MT LIQD
15.0000 mL | Freq: Two times a day (BID) | OROMUCOSAL | Status: DC
  Administered 2011-08-10: 15 mL via OROMUCOSAL

## 2011-08-09 SURGICAL SUPPLY — 42 items
APPLIER CLIP 5 13 M/L LIGAMAX5 (MISCELLANEOUS) ×3
APPLIER CLIP ROT 10 11.4 M/L (STAPLE) ×3
CANISTER SUCTION 2500CC (MISCELLANEOUS) ×3 IMPLANT
CATH REDDICK CHOLANGI 4FR 50CM (CATHETERS) ×3 IMPLANT
CLIP APPLIE 5 13 M/L LIGAMAX5 (MISCELLANEOUS) ×2 IMPLANT
CLIP APPLIE ROT 10 11.4 M/L (STAPLE) ×2 IMPLANT
CLOTH BEACON ORANGE TIMEOUT ST (SAFETY) ×3 IMPLANT
COVER MAYO STAND STRL (DRAPES) ×3 IMPLANT
DECANTER SPIKE VIAL GLASS SM (MISCELLANEOUS) IMPLANT
DERMABOND ADVANCED (GAUZE/BANDAGES/DRESSINGS) ×2
DERMABOND ADVANCED .7 DNX12 (GAUZE/BANDAGES/DRESSINGS) ×4 IMPLANT
DRAIN CHANNEL 19F RND (DRAIN) ×3 IMPLANT
DRAPE C-ARM 42X72 X-RAY (DRAPES) ×3 IMPLANT
DRAPE LAPAROSCOPIC ABDOMINAL (DRAPES) ×3 IMPLANT
DRAPE UTILITY XL STRL (DRAPES) ×3 IMPLANT
ELECT REM PT RETURN 9FT ADLT (ELECTROSURGICAL) ×3
ELECTRODE REM PT RTRN 9FT ADLT (ELECTROSURGICAL) ×2 IMPLANT
ENDOLOOP SUT PDS II  0 18 (SUTURE) ×1
ENDOLOOP SUT PDS II 0 18 (SUTURE) ×2 IMPLANT
EVACUATOR SILICONE 100CC (DRAIN) ×3 IMPLANT
GLOVE BIO SURGEON STRL SZ7.5 (GLOVE) ×6 IMPLANT
GLOVE BIOGEL PI IND STRL 7.0 (GLOVE) ×2 IMPLANT
GLOVE BIOGEL PI INDICATOR 7.0 (GLOVE) ×1
GOWN PREVENTION PLUS XLARGE (GOWN DISPOSABLE) ×3 IMPLANT
GOWN STRL NON-REIN LRG LVL3 (GOWN DISPOSABLE) ×3 IMPLANT
GOWN STRL REIN XL XLG (GOWN DISPOSABLE) ×3 IMPLANT
HEMOSTAT SURGICEL 4X8 (HEMOSTASIS) ×3 IMPLANT
IV CATH 14GX2 1/4 (CATHETERS) ×3 IMPLANT
KIT BASIN OR (CUSTOM PROCEDURE TRAY) ×3 IMPLANT
POUCH SPECIMEN RETRIEVAL 10MM (ENDOMECHANICALS) IMPLANT
SET IRRIG TUBING LAPAROSCOPIC (IRRIGATION / IRRIGATOR) ×3 IMPLANT
SOLUTION ANTI FOG 6CC (MISCELLANEOUS) ×3 IMPLANT
SPONGE GAUZE 4X4 12PLY (GAUZE/BANDAGES/DRESSINGS) ×3 IMPLANT
SUT ETHILON 2 0 PS N (SUTURE) ×3 IMPLANT
SUT MNCRL AB 4-0 PS2 18 (SUTURE) ×3 IMPLANT
TAPE CLOTH SURG 4X10 WHT LF (GAUZE/BANDAGES/DRESSINGS) ×3 IMPLANT
TOWEL OR 17X26 10 PK STRL BLUE (TOWEL DISPOSABLE) ×3 IMPLANT
TRAY LAP CHOLE (CUSTOM PROCEDURE TRAY) ×3 IMPLANT
TROCAR BLADELESS OPT 5 75 (ENDOMECHANICALS) ×12 IMPLANT
TROCAR XCEL BLUNT TIP 100MML (ENDOMECHANICALS) ×3 IMPLANT
TROCAR XCEL NON-BLD 11X100MML (ENDOMECHANICALS) ×3 IMPLANT
TUBING INSUFFLATION 10FT LAP (TUBING) ×3 IMPLANT

## 2011-08-09 NOTE — Transfer of Care (Signed)
Immediate Anesthesia Transfer of Care Note  Patient: [name]  Procedure(s) Performed: Procedure(s) (LRB): LAPAROSCOPIC CHOLECYSTECTOMY (N/A)  Patient Location: PACU  Anesthesia Type: General  Level of Consciousness: sedated  Airway & Oxygen Therapy: Patient Spontanous Breathing and Patient connected to face mask oxygen  Post-op Assessment: Report given to PACU RN and Post -op Vital signs reviewed and stable  Post vital signs: Reviewed and stable  Complications: No apparent anesthesia complications

## 2011-08-09 NOTE — Anesthesia Procedure Notes (Signed)
Procedure Name: Intubation Date/Time: 08/09/2011 2:37 PM Performed by: Joycie Peek Pre-anesthesia Checklist: Patient identified, Timeout performed, Emergency Drugs available, Suction available and Patient being monitored Patient Re-evaluated:Patient Re-evaluated prior to inductionOxygen Delivery Method: Circle System Utilized Preoxygenation: Pre-oxygenation with 100% oxygen Intubation Type: IV induction Ventilation: Mask ventilation without difficulty Laryngoscope Size: Mac and 4 Grade View: Grade I Tube type: Oral Tube size: 7.5 mm Number of attempts: 1 Airway Equipment and Method: stylet Placement Confirmation: ETT inserted through vocal cords under direct vision,  breath sounds checked- equal and bilateral and positive ETCO2 Secured at: 21 cm Tube secured with: Tape Dental Injury: Teeth and Oropharynx as per pre-operative assessment

## 2011-08-09 NOTE — ED Provider Notes (Signed)
78 y.o. Female with abdominal pain and cholecystitis on ct scan.  Patient with elevated wbc and pancreatic lesion seen on ct.  Discussed with Dr. Biagio Quint and patient transferred to [facility] hospital for surgery.   Hilario Quarry, MD 08/09/11 1520

## 2011-08-09 NOTE — Anesthesia Preprocedure Evaluation (Signed)
Anesthesia Evaluation  Patient identified by MRN, date of birth, ID band Patient awake    Reviewed: Allergy & Precautions, H&P , NPO status , Patient's Chart, lab work & pertinent test results, reviewed documented beta blocker date and time   Airway Mallampati: II TM Distance: >3 FB Neck ROM: full    Dental No notable dental hx.    Pulmonary neg pulmonary ROS,  clear to auscultation  Pulmonary exam normal       Cardiovascular Exercise Tolerance: Good neg cardio ROS regular Normal    Neuro/Psych Negative Neurological ROS  Negative Psych ROS   GI/Hepatic negative GI ROS, Neg liver ROS,   Endo/Other  Negative Endocrine ROS  Renal/GU negative Renal ROS  Genitourinary negative   Musculoskeletal   Abdominal   Peds  Hematology negative hematology ROS (+)   Anesthesia Other Findings   Reproductive/Obstetrics negative OB ROS                           Anesthesia Physical Anesthesia Plan  ASA: I  Anesthesia Plan: General   Post-op Pain Management:    Induction: Intravenous  Airway Management Planned: Oral ETT  Additional Equipment:   Intra-op Plan:   Post-operative Plan: Extubation in OR  Informed Consent: I have reviewed the patients History and Physical, chart, labs and discussed the procedure including the risks, benefits and alternatives for the proposed anesthesia with the patient or authorized representative who has indicated his/her understanding and acceptance.   Dental Advisory Given  Plan Discussed with: CRNA and Surgeon  Anesthesia Plan Comments:         Anesthesia Quick Evaluation

## 2011-08-09 NOTE — Anesthesia Postprocedure Evaluation (Signed)
  Anesthesia Post-op Note  Patient: [name]  Procedure(s) Performed: Procedure(s) (LRB): LAPAROSCOPIC CHOLECYSTECTOMY (N/A)  Patient Location: PACU  Anesthesia Type: General  Level of Consciousness: awake and alert   Airway and Oxygen Therapy: Patient Spontanous Breathing  Post-op Pain: mild  Post-op Assessment: Post-op Vital signs reviewed, Patient's Cardiovascular Status Stable, Respiratory Function Stable, Patent Airway and No signs of Nausea or vomiting  Post-op Vital Signs: stable  Complications: No apparent anesthesia complications

## 2011-08-09 NOTE — Progress Notes (Signed)
Subjective: Still having ruq pain  Objective: Vital signs in last 24 hours: Temp:  [99 F (37.2 C)-99.9 F (37.7 C)] 99.1 F (37.3 C) (02/14 0210) Pulse Rate:  [75-93] 75  (02/14 0210) Resp:  [16-20] 16  (02/14 0210) BP: (101-137)/(49-75) 101/63 mmHg (02/14 0210) SpO2:  [92 %-100 %] 93 % (02/14 0210) Weight:  [195 lb (88.451 kg)] 195 lb (88.451 kg) (02/13 2100) Last BM Date: 08/07/11  Intake/Output from previous day: 02/13 0701 - 02/14 0700 In: 862.5 [I.V.:862.5] Out: -  Intake/Output this shift:    GI: tender ruq  Lab Results:   Basename 08/09/11 0420 08/08/11 1530  WBC 11.7* 14.6*  HGB 12.4 14.0  HCT 38.3 41.2  PLT 213 241   BMET  Basename 08/09/11 0420 08/08/11 1530  NA 136 138  K 3.5 4.3  CL 101 101  CO2 25 25  GLUCOSE 146* 152*  BUN 14 22  CREATININE 0.69 0.60  CALCIUM 8.6 9.6   PT/INR No results found for this basename: LABPROT:2,INR:2 in the last 72 hours ABG No results found for this basename: PHART:2,PCO2:2,PO2:2,HCO3:2 in the last 72 hours  Studies/Results: Dg Chest 2 View  08/08/2011  *RADIOLOGY REPORT*  Clinical Data: Abdominal pain, nausea  CHEST - 2 VIEW  Comparison: 08/30/2010  Findings: 7 mm nodule in the lateral right lower lobe, stable since prior scan.  Minimal subsegmental atelectasis or scarring in the inferior lingula as before.  Heart size normal.  No effusion. Tortuous atheromatous thoracic aorta.  Regional bones unremarkable.  IMPRESSION:  1.  No definite change in the right lower lobe nodule. 2.  No acute disease.  Original Report Authenticated By: Osa Craver, M.D.   Ct Abdomen Pelvis W Contrast  08/08/2011  *RADIOLOGY REPORT*  Clinical Data: Abdominal pain, nausea, vomiting.  CT ABDOMEN AND PELVIS WITH CONTRAST  Technique:  Multidetector CT imaging of the abdomen and pelvis was performed following the standard protocol during bolus administration of intravenous contrast.  Contrast: OMNIPAQUE IOHEXOL 300 MG/ML IV  SOLN  Comparison: 08/28/2010  Findings: Right base atelectasis.  No effusions.  Heart is upper limits normal in size.  Coronary artery calcifications.  Gallbladder is distended with surrounding inflammation, wall thickening, pericholecystic fluid and perihepatic ascites. Findings concerning for acute cholecystitis.  No biliary ductal dilatation.  Elongated low density lesion within the pancreatic body measures up to 2 cm on image 30.  This was not as easily visualized on prior study when measured approximately 14 mm in greatest diameter.  Low-density lesions within the liver, likely cysts.  Spleen, adrenals are unremarkable.  Bilateral renal parapelvic cysts are stable.  No hydronephrosis.  Large and small bowel are unremarkable.  No free air or adenopathy. Uterus, urinary bladder and adnexa unremarkable.  No acute bony abnormality.  IMPRESSION: Distended, inflamed gallbladder with wall thickening and pericholecystic fluid concerning for acute cholecystitis.  Small amount of perihepatic ascites.  Right lower lobe atelectasis.  2 cm elongated low density lesion within the pancreatic body, slightly larger than prior study.  This could be further characterized with pancreatic protocol MRI.  Original Report Authenticated By: Cyndie Chime, M.D.    Anti-infectives: Anti-infectives     Start     Dose/Rate Route Frequency Ordered Stop   08/08/11 2359   ciprofloxacin (CIPRO) IVPB 400 mg        400 mg 200 mL/hr over 60 Minutes Intravenous Every 12 hours 08/08/11 2257     08/08/11 1815   piperacillin-tazobactam (ZOSYN) IVPB  3.375 g        3.375 g 12.5 mL/hr over 240 Minutes Intravenous  Once 08/08/11 1810 08/08/11 2222          Assessment/Plan: s/p * No surgery found * plan for lap chole today if time allows.  Risks and benefits of surgery discussed with pt and she understands and wishes to proceed  LOS: 1 day    TOTH III,Maycee Blasco S 08/09/2011

## 2011-08-09 NOTE — Op Note (Signed)
08/08/2011 - 08/09/2011  4:09 PM  PATIENT:  [name]  78 y.o. female  PRE-OPERATIVE DIAGNOSIS:  cholelithiasis  POST-OPERATIVE DIAGNOSIS:  cholelithiasis  PROCEDURE:  Procedure(s) (LRB): LAPAROSCOPIC CHOLECYSTECTOMY (N/A)  SURGEON:  Surgeon(s) and Role:    * Robyne Askew, MD - Primary  PHYSICIAN ASSISTANT:   ASSISTANTS: Dr. Andrey Campanile   ANESTHESIA:   general  EBL:  Total I/O In: 1000 [I.V.:1000] Out: 200 [Urine:200]  BLOOD ADMINISTERED:none  DRAINS: (1) Jackson-Pratt drain(s) with closed bulb suction in the liver bed   LOCAL MEDICATIONS USED:  MARCAINE     SPECIMEN:  Source of Specimen:  gallbladder  DISPOSITION OF SPECIMEN:  PATHOLOGY  COUNTS:  YES  TOURNIQUET:  * No tourniquets in log *  DICTATION: .Dragon Dictation After informed consent was obtained the patient was brought to the operating room placed in the supine position on the operating room table. After adequate induction of general anesthesia the patient's abdomen was prepped with ChloraPrep, allowed to dry, and draped in usual sterile manner. The area above the umbilicus was infiltrated with corrigent Marcaine. A small incision was made with a 15 blade knife. This incision was carried down through the subcutaneous tissue blunt with a hemostat and [employer] retractors until the linea alba was identified. The linea alba was incised with a 15 blade knife. The patient had a previous ventral hernia repair with mesh and we did encounter some mesh during this part of the case. The preperitoneal space was probed low with a hemostat until the peritoneum was opened and access was gained to the abdominal cavity. A 0 Vicryl pursestring stitch was placed in the fascia around the opening. A Hassan cannula was placed through the opening and anchored in place with the previously placed Vicryl pursestring stitch. A laparoscope was placed in the ascending cannula. We did encounter some filmy adhesions which we were able to  bluntly dissect through until we had reached free space in the abdominal cavity. The epigastric region was then infiltrated with corrigent Marcaine. A small stab incision was made with a 15 blade knife. A 5 mm port was placed bluntly through this incision and the abdominal cavity under direct vision. 2 sites were then chosen laterally on the right side of the abdomen for placement of 5 mm ports. Each of these there is (corrigent Marcaine. Small stab incisions were made with a 15 blade knife. 5 mm ports were then placed bluntly through these incisions into the abdominal cavity under direct vision. A fourth 5 mm port site was placed in the upper midabdomen to retract almost floppy falciform ligament. A blunt grasper was then placed through the lateralmost 5 mm port. It was used to grasp the dome of the gallbladder and elevated anteriorly and superiorly. We did have to aspirate the contents of the gallbladder in order to be able to grab it. The gallbladder was very distended and inflamed. Another blunt grasper was placed to the other from ulnar port and used to retract on the body and neck of the gallbladder. A dissector was placed through the epigastric port and using the electrocautery the peritoneal reflection at the gallbladder neck was opened. Blunt dissection was carried out until the gallbladder neck cystic duct junction was readily identified and the window was created.the gallbladder was necrotic and started to fall apart. We placed 3 clips on the proximal cystic duct and one distally and the cystic duct was divided between the 2. Posterior to this the cystic artery was identified  and again dissected bluntly in a circumferential manner until a good window was created. 2 clips placed proximally and one distally on the artery and the artery was divided between 2. Next a laparoscopic hook cautery device was used to separate the gallbladder from the liver bed. Once the gallbladder was free a 5 mm camera was placed  in the epigastric port. A laparoscopic bag was placed through the [facility] cannula and the gallbladder was placed within the bag and the bag was sealed. A blunt grasper was placed through the lateralmost 5 mm port and up through the epigastric port. It was used to bring a 41 [place] round Blake drain into the abdominal cavity. The drain was anchored to the skin with a 2-0 nylon stitch. The drain was placed in the liver bed where the gallbladder had been. The Hassan cannula and the gallbladder and the bag removed through the suprabuccal port without difficulty. The fascia was closed with a figure-of-eight #1 Novafil stitch as well as the previously placed Vicryl pursestring stitch.the rest of the ports were removed under direct vision and found to be hemostatic. The gas was allowed to escape. The drain was placed to bulb suction. The incisions were closed with interrupted 4-0 Monocryl subcuticular stitches. Dermabond dressings were applied. The patient tolerated the procedure well. At the end of the case on needle sponge and instrument counts were correct. The patient was then awakened and taken to recovery in stable condition.  PLAN OF CARE: Admit to inpatient   PATIENT DISPOSITION:  PACU - hemodynamically stable.   Delay start of Pharmacological VTE agent (>24hrs) due to surgical blood loss or risk of bleeding: yes

## 2011-08-10 ENCOUNTER — Inpatient Hospital Stay (HOSPITAL_COMMUNITY): Payer: Medicare Other

## 2011-08-10 DIAGNOSIS — E669 Obesity, unspecified: Secondary | ICD-10-CM

## 2011-08-10 DIAGNOSIS — K8010 Calculus of gallbladder with chronic cholecystitis without obstruction: Secondary | ICD-10-CM

## 2011-08-10 MED ORDER — ACETAMINOPHEN 325 MG PO TABS: 650.0000 mg | ORAL_TABLET | Freq: Four times a day (QID) | ORAL | Status: DC | PRN

## 2011-08-10 MED ORDER — OXYCODONE-ACETAMINOPHEN 5-325 MG PO TABS
1.0000 | ORAL_TABLET | ORAL | Status: DC | PRN
  Administered 2011-08-10 – 2011-08-11 (×5): 1 via ORAL
  Filled 2011-08-10 (×5): qty 1

## 2011-08-10 NOTE — Progress Notes (Signed)
Will get CT of chest to follow up finding of pulm nodules

## 2011-08-10 NOTE — Progress Notes (Signed)
1 Day Post-Op  Subjective: Good day, bad night.  Pain with getting up to void.  110 ml thru drain.  It is empty right now, but appears clear yellow-serous right now.  Objective: Vital signs in last 24 hours: Temp:  [97.8 F (36.6 C)-100.8 F (38.2 C)] 98.9 F (37.2 C) (02/15 0600) Pulse Rate:  [73-89] 85  (02/15 0600) Resp:  [15-20] 16  (02/15 0600) BP: (112-146)/(58-82) 144/79 mmHg (02/15 0600) SpO2:  [91 %-100 %] 96 % (02/15 0600) Last BM Date: 08/08/11  Intake/Output from previous day: 02/14 0701 - 02/15 0700 In: 4652.9 [P.O.:240; I.V.:3972.9; IV Piggyback:400] Out: 2310 [Urine:2200; Drains:110] Intake/Output this shift:    PE: Alert, trying to eat jello, Chest Clear anteriorly,  Abd: obses, tender, drain site dressing changed, incisions look good.  Very tender..  Lab Results:   Basename 08/09/11 0420 08/08/11 1530  WBC 11.7* 14.6*  HGB 12.4 14.0  HCT 38.3 41.2  PLT 213 241    BMET  Basename 08/09/11 0420 08/08/11 1530  NA 136 138  K 3.5 4.3  CL 101 101  CO2 25 25  GLUCOSE 146* 152*  BUN 14 22  CREATININE 0.69 0.60  CALCIUM 8.6 9.6   PT/INR No results found for this basename: LABPROT:2,INR:2 in the last 72 hours   Lab 08/09/11 0420 08/08/11 1530  AST 23 24  ALT 16 16  ALKPHOS 67 82  BILITOT 0.5 0.3  PROT 6.3 7.5  ALBUMIN 3.0* 3.9    Studies/Results: Dg Chest 2 View  08/08/2011  *RADIOLOGY REPORT*  Clinical Data: Abdominal pain, nausea  CHEST - 2 VIEW  Comparison: 08/30/2010  Findings: 7 mm nodule in the lateral right lower lobe, stable since prior scan.  Minimal subsegmental atelectasis or scarring in the inferior lingula as before.  Heart size normal.  No effusion. Tortuous atheromatous thoracic aorta.  Regional bones unremarkable.  IMPRESSION:  1.  No definite change in the right lower lobe nodule. 2.  No acute disease.  Original Report Authenticated By: Osa Craver, M.D.   Ct Abdomen Pelvis W Contrast  08/08/2011  *RADIOLOGY REPORT*   Clinical Data: Abdominal pain, nausea, vomiting.  CT ABDOMEN AND PELVIS WITH CONTRAST  Technique:  Multidetector CT imaging of the abdomen and pelvis was performed following the standard protocol during bolus administration of intravenous contrast.  Contrast: OMNIPAQUE IOHEXOL 300 MG/ML IV SOLN  Comparison: 08/28/2010  Findings: Right base atelectasis.  No effusions.  Heart is upper limits normal in size.  Coronary artery calcifications.  Gallbladder is distended with surrounding inflammation, wall thickening, pericholecystic fluid and perihepatic ascites. Findings concerning for acute cholecystitis.  No biliary ductal dilatation.  Elongated low density lesion within the pancreatic body measures up to 2 cm on image 30.  This was not as easily visualized on prior study when measured approximately 14 mm in greatest diameter.  Low-density lesions within the liver, likely cysts.  Spleen, adrenals are unremarkable.  Bilateral renal parapelvic cysts are stable.  No hydronephrosis.  Large and small bowel are unremarkable.  No free air or adenopathy. Uterus, urinary bladder and adnexa unremarkable.  No acute bony abnormality.  IMPRESSION: Distended, inflamed gallbladder with wall thickening and pericholecystic fluid concerning for acute cholecystitis.  Small amount of perihepatic ascites.  Right lower lobe atelectasis.  2 cm elongated low density lesion within the pancreatic body, slightly larger than prior study.  This could be further characterized with pancreatic protocol MRI.  Original Report Authenticated By: Cyndie Chime, M.D.  Anti-infectives: Anti-infectives     Start     Dose/Rate Route Frequency Ordered Stop   08/08/11 2359   ciprofloxacin (CIPRO) IVPB 400 mg        400 mg 200 mL/hr over 60 Minutes Intravenous Every 12 hours 08/08/11 2257     08/08/11 1815  piperacillin-tazobactam (ZOSYN) IVPB 3.375 g       3.375 g 12.5 mL/hr over 240 Minutes Intravenous  Once 08/08/11 1810 08/08/11 2222           Current Facility-Administered Medications  Medication Dose Route Frequency Provider Last Rate Last Dose  . antiseptic oral rinse (BIOTENE) solution 15 mL  15 mL Mouth Rinse q12n4p Rulon Abide, DO      . chlorhexidine (PERIDEX) 0.12 % solution 15 mL  15 mL Mouth Rinse BID Rulon Abide, DO   15 mL at 08/10/11 0855  . ciprofloxacin (CIPRO) IVPB 400 mg  400 mg Intravenous Q12H Rulon Abide, DO   400 mg at 08/10/11 0121  . dextrose 5 % and 0.45 % NaCl with KCl 20 mEq/L infusion   Intravenous Continuous Rulon Abide, DO 125 mL/hr at 08/10/11 0420    . morphine 2 MG/ML injection 2 mg  2 mg Intravenous Q1H PRN Rulon Abide, DO   2 mg at 08/10/11 0858  . ondansetron (ZOFRAN) injection 4 mg  4 mg Intravenous Q6H PRN Rulon Abide, DO   4 mg at 08/08/11 2349  . DISCONTD: bupivacaine-EPINEPHrine (MARCAINE W/ EPI) 0.25 % (with pres) injection    PRN Caleen Essex III, MD   30 mL at 08/09/11 1608  . DISCONTD: fentaNYL (SUBLIMAZE) injection 25-50 mcg  25-50 mcg Intravenous Q5 min PRN Gaetano Hawthorne, MD   50 mcg at 08/09/11 1652  . DISCONTD: lactated ringers infusion   Intravenous Continuous Gaetano Hawthorne, MD      . DISCONTD: lactated ringers infusion   Intravenous Continuous Gaetano Hawthorne, MD      . DISCONTD: lactated ringers infusion    PRN Caleen Essex III, MD   1,000 mL at 08/09/11 1607  . DISCONTD: promethazine (PHENERGAN) injection 6.25-12.5 mg  6.25-12.5 mg Intravenous Q15 min PRN Gaetano Hawthorne, MD       Facility-Administered Medications Ordered in Other Encounters  Medication Dose Route Frequency Provider Last Rate Last Dose  . DISCONTD: acetaminophen (OFIRMEV) IV    PRN Joycie Peek, CRNA   1,000 mg at 08/09/11 1456  . DISCONTD: ciprofloxacin (CIPRO) IVPB    PRN Joycie Peek, CRNA   400 mg at 08/09/11 1425  . DISCONTD: cisatracurium (NIMBEX) injection    PRN Joycie Peek, CRNA   2 mg at 08/09/11 1527  . DISCONTD: ePHEDrine injection    PRN Joycie Peek, CRNA    10 mg at 08/09/11 1445  . DISCONTD: fentaNYL (SUBLIMAZE) injection    PRN Joycie Peek, CRNA   50 mcg at 08/09/11 1600  . DISCONTD: glycopyrrolate (ROBINUL) injection    PRN Joycie Peek, CRNA   0.6 mg at 08/09/11 1600  . DISCONTD: lactated ringers infusion    Continuous PRN Joycie Peek, CRNA      . DISCONTD: lidocaine (cardiac) 100 mg/40ml (XYLOCAINE) 20 MG/ML injection 2%    PRN Joycie Peek, CRNA   50 mg at 08/09/11 1435  . DISCONTD: midazolam (VERSED) 5 MG/5ML injection    PRN Alfredo Bach, CRNA   1 mg at 08/09/11 1434  . DISCONTD: neostigmine (PROSTIGMINE) injection  Intravenous PRN Joycie Peek, CRNA   4 mg at 08/09/11 1600  . DISCONTD: ondansetron (ZOFRAN) injection    PRN Alfredo Bach, CRNA   2 mg at 08/09/11 1415  . DISCONTD: propofol (DIPRIVAN) 10 MG/ML infusion    PRN Joycie Peek, CRNA   150 mg at 08/09/11 1435  . DISCONTD: succinylcholine (ANECTINE) injection    PRN Joycie Peek, CRNA   100 mg at 08/09/11 1435    Assessment/Plan Cholelithiasis/Cholecystitis;   s/p LAPAROSCOPIC CHOLECYSTECTOMY 08/09/11 Dr. Rogelio Seen hernias x2, left inguinal hernia 11/03/10 Dr. Corliss Skains BMI 33.5  Plan:  Mobilize, watch drain, advance diet, if she's doing well at supper. PO pain meds., cbc in AM.   LOS: 2 days    Dick Hark 08/10/2011

## 2011-08-11 LAB — CBC
HCT: 36.3 % (ref 36.0–46.0)
MCHC: 32 g/dL (ref 30.0–36.0)
Platelets: 200 10*3/uL (ref 150–400)
RDW: 15.3 % (ref 11.5–15.5)

## 2011-08-11 MED ORDER — OXYCODONE-ACETAMINOPHEN 5-325 MG PO TABS: 1.0000 | ORAL_TABLET | ORAL | Status: DC | PRN

## 2011-08-11 NOTE — Discharge Instructions (Signed)
Gallbladder Disease Gallbladder disease (cholecystitis) is an inflammation of your gallbladder. It is usually caused by a build-up of stones (gallstones) or sludge (cholelithiasis) in your gallbladder. The gallbladder is not an essential organ. It is located slightly to the right of center in the belly (abdomen), behind the liver. It stores bile made in the liver. Bile aids in digestion of fats. Gallbladder disease may result in nausea (feeling sick to your stomach), abdominal pain, and jaundice. In severe cases, emergency surgery may be required. The most common type of gallbladder disease is gallstones. They begin as small crystals and slowly grow into stones. Gallstone pain occurs when the bile duct has spasms. The spasms are caused by the stone passing out of the duct. The stone is trying to pass at the same time bile is passing into the small bowel for digestion. The pain usually begins suddenly. It may persist from several minutes to several hours. Infection can occur. Infection can add to discomfort and severity of an acute attack. The pain may be made worse by breathing deeply or by being jarred. There may be fever and tenderness to the touch. In some cases, when gallstones do not move into the bile duct, people have no pain or symptoms. These are called "silent" gallstones. Women are three times more likely to develop gallstones than men. Women who have had several pregnancies are more likely to have gallbladder disease. Physicians sometimes advise removing diseased gallbladders before future pregnancies. Other factors that increase the risk of gallbladder disease are obesity, diets heavy in fried foods and dairy products, increasing age, prolonged use of medications containing female hormones, and heredity. HOME CARE INSTRUCTIONS   If your physician prescribed an antibiotic, take as directed.   Only take over-the-counter or prescription medicines for pain, discomfort, or fever as directed by your  caregiver.   Follow a low fat diet until seen again. (Fat causes the gallbladder to contract.)   Follow-up as instructed. Attacks are almost always recurrent and surgery is usually required for permanent treatment.  SEEK IMMEDIATE MEDICAL CARE IF:   Pain is increasing and not controlled by medications.   The pain moves to another part of your abdomen or to your back. (Right sided pain can be appendicitis and left sided pain in adults can be diverticulitis).   You have a fever.   You develop nausea and vomiting.  Document Released: 06/11/2005 Document Revised: 02/21/2011 Document Reviewed: 01/29/2008 [facility] Patient Information 2012 [place], [place].Gallbladder Disease Gallbladder disease (cholecystitis) is an inflammation of your gallbladder. It is usually caused by a build-up of stones (gallstones) or sludge (cholelithiasis) in your gallbladder. The gallbladder is not an essential organ. It is located slightly to the right of center in the belly (abdomen), behind the liver. It stores bile made in the liver. Bile aids in digestion of fats. Gallbladder disease may result in nausea (feeling sick to your stomach), abdominal pain, and jaundice. In severe cases, emergency surgery may be required. The most common type of gallbladder disease is gallstones. They begin as small crystals and slowly grow into stones. Gallstone pain occurs when the bile duct has spasms. The spasms are caused by the stone passing out of the duct. The stone is trying to pass at the same time bile is passing into the small bowel for digestion. The pain usually begins suddenly. It may persist from several minutes to several hours. Infection can occur. Infection can add to discomfort and severity of an acute attack. The pain may be made  worse by breathing deeply or by being jarred. There may be fever and tenderness to the touch. In some cases, when gallstones do not move into the bile duct, people have no pain or symptoms. These are  called "silent" gallstones. Women are three times more likely to develop gallstones than men. Women who have had several pregnancies are more likely to have gallbladder disease. Physicians sometimes advise removing diseased gallbladders before future pregnancies. Other factors that increase the risk of gallbladder disease are obesity, diets heavy in fried foods and dairy products, increasing age, prolonged use of medications containing female hormones, and heredity. HOME CARE INSTRUCTIONS   If your physician prescribed an antibiotic, take as directed.   Only take over-the-counter or prescription medicines for pain, discomfort, or fever as directed by your caregiver.   Follow a low fat diet until seen again. (Fat causes the gallbladder to contract.)   Follow-up as instructed. Attacks are almost always recurrent and surgery is usually required for permanent treatment.  SEEK IMMEDIATE MEDICAL CARE IF:   Pain is increasing and not controlled by medications.   The pain moves to another part of your abdomen or to your back. (Right sided pain can be appendicitis and left sided pain in adults can be diverticulitis).   You have a fever.   You develop nausea and vomiting.  Document Released: 06/11/2005 Document Revised: 02/21/2011 Document Reviewed: 01/29/2008 [facility] Patient Information 2012 [place], [place].

## 2011-08-11 NOTE — Discharge Summary (Signed)
Physician Discharge Summary  Patient ID: [name] MRN: [id number] DOB/AGE: 1933/11/08 78 y.o.  Admit date: 08/08/2011 Discharge date: 08/11/2011  Admission Diagnoses:  cholecystitis  Discharge Diagnoses:  same  Principal Problem:  *Cholelithiasis with acute or chronic cholecystitis Active Problems:  Obesity (BMI 30.0-34.9)   Surgery:  Lap chole   Discharged Condition: improved  Hospital Course:   Had cholecystectomy with placement of JP drain.  Will go home with drain to followup with Dr. Carolynne Edouard in the office  Consults: none  Significant Diagnostic Studies: none    Discharge Exam: Blood pressure 130/73, pulse 94, temperature 98.9 F (37.2 C), temperature source Oral, resp. rate 18, height 5\' 4"  (1.626 m), weight 195 lb (88.451 kg), SpO2 96.00%. JP drainage is serous  Disposition: Home or Self Care  Discharge Orders    Future Orders Please Complete By Expires   Diet - low sodium heart healthy      Increase activity slowly      Discharge instructions      Comments:   Instruct in JP drain emptying   Discharge wound care:      Comments:   Empty JP as needed   Call MD for:  temperature >100.4      Call MD for:  persistant nausea and vomiting        Medication List  As of 08/11/2011  9:41 AM   TAKE these medications         calcium carbonate 600 MG Tabs   Commonly known as: OS-CAL   Take 600 mg by mouth daily.      Garlic 1000 MG Caps   Take 1 capsule by mouth daily.      Ginkgo Biloba 40 MG Tabs   Take 1 tablet by mouth daily.      Lutein 20 MG Caps   Take 1 capsule by mouth daily.      mulitivitamin with minerals Tabs   Take 1 tablet by mouth daily.      Oil of Oregano 1500 MG Caps   Take 1 capsule by mouth daily.      oxyCODONE-acetaminophen 5-325 MG per tablet   Commonly known as: PERCOCET   Take 1-2 tablets by mouth every 4 (four) hours as needed.      Spirulina 500 MG Tabs   Take 1 tablet by mouth daily.      Turmeric 450 MG Caps   Take  1 capsule by mouth daily.      Vitamin D-3 5000 UNITS Tabs   Take 1 tablet by mouth daily.           Follow-up Information    Follow up with TOTH III,PAUL S, MD in 5 days.   Contact information:   [employer] [address] [place] [address] [phone]          Signed: Valarie Merino 08/11/2011, 9:41 AM

## 2011-08-21 ENCOUNTER — Encounter (INDEPENDENT_AMBULATORY_CARE_PROVIDER_SITE_OTHER): Payer: Self-pay | Admitting: General Surgery

## 2011-08-21 ENCOUNTER — Ambulatory Visit (INDEPENDENT_AMBULATORY_CARE_PROVIDER_SITE_OTHER): Payer: Medicare Other | Admitting: General Surgery

## 2011-08-21 VITALS — BP 118/66 | HR 116 | Temp 97.8°F | Resp 16 | Ht 64.5 in | Wt 186.4 lb

## 2011-08-21 DIAGNOSIS — K812 Acute cholecystitis with chronic cholecystitis: Secondary | ICD-10-CM

## 2011-08-21 NOTE — Patient Instructions (Signed)
Keep drain site dry for next 24 hours.  Call for redness, fever or any problems with drain site. You can remove steri strips in 5 days if they don't come off showering.  No lifting over 10 lbs for 3 more weeks.

## 2011-08-21 NOTE — Progress Notes (Signed)
[name] [birth year] [id number] 08/21/2011   [name] is a 78 y.o. female who had a laparoscopic cholecystectomy with intraoperative cholangiogram.  The pathology report confirmed acute cholecystitis.  The patient reports that they are feeling well with normal bowel movements and good appetite.  The pre-operative symptoms of abdominal pain, nausea, and vomiting have resolved.  She is still  having some loose stools.  Physical examination - Incisions appear well-healed with no sign of infection or bleeding.   Abdomen - soft, non-tender  Drain removed .  Averaging 5-10 ml clear serous drainage.  Impression:  s/p laparoscopic cholecystectomy  Plan:  She may resume a regular diet and full activity.  She may follow-up on a PRN basis.

## 2011-08-22 ENCOUNTER — Encounter (INDEPENDENT_AMBULATORY_CARE_PROVIDER_SITE_OTHER): Payer: Medicare Other | Admitting: General Surgery

## 2011-08-27 ENCOUNTER — Encounter (HOSPITAL_COMMUNITY): Payer: Self-pay | Admitting: General Surgery

## 2011-09-04 ENCOUNTER — Ambulatory Visit (INDEPENDENT_AMBULATORY_CARE_PROVIDER_SITE_OTHER): Payer: Medicare Other | Admitting: General Surgery

## 2011-09-04 DIAGNOSIS — Z4802 Encounter for removal of sutures: Secondary | ICD-10-CM

## 2011-09-04 NOTE — Progress Notes (Signed)
The patient comes to the clinic today with a clear suture poking out from a laparoscopic site. Spoke with Dr Carolynne Edouard and he stated it was fine to pull it out. Removed a tiny suture from area with out any complications or bleeding. Patient was fine and incision is closed and well approximated. Patient is told to return to the clinic if any problems.

## 2013-11-07 ENCOUNTER — Emergency Department (HOSPITAL_BASED_OUTPATIENT_CLINIC_OR_DEPARTMENT_OTHER): Payer: Medicare Other

## 2013-11-07 ENCOUNTER — Emergency Department (HOSPITAL_BASED_OUTPATIENT_CLINIC_OR_DEPARTMENT_OTHER)
Admission: EM | Admit: 2013-11-07 | Discharge: 2013-11-07 | Disposition: A | Discharge status: Home / Self Care | Payer: Medicare Other | Attending: Emergency Medicine | Admitting: Emergency Medicine

## 2013-11-07 ENCOUNTER — Encounter (HOSPITAL_BASED_OUTPATIENT_CLINIC_OR_DEPARTMENT_OTHER): Payer: Self-pay | Admitting: Emergency Medicine

## 2013-11-07 DIAGNOSIS — Z79899 Other long term (current) drug therapy: Secondary | ICD-10-CM | POA: Insufficient documentation

## 2013-11-07 DIAGNOSIS — S43036A Inferior dislocation of unspecified humerus, initial encounter: Secondary | ICD-10-CM | POA: Insufficient documentation

## 2013-11-07 DIAGNOSIS — S43016A Anterior dislocation of unspecified humerus, initial encounter: Secondary | ICD-10-CM | POA: Insufficient documentation

## 2013-11-07 DIAGNOSIS — R296 Repeated falls: Secondary | ICD-10-CM | POA: Insufficient documentation

## 2013-11-07 DIAGNOSIS — Y929 Unspecified place or not applicable: Secondary | ICD-10-CM | POA: Insufficient documentation

## 2013-11-07 DIAGNOSIS — S43004A Unspecified dislocation of right shoulder joint, initial encounter: Secondary | ICD-10-CM

## 2013-11-07 DIAGNOSIS — Y9389 Activity, other specified: Secondary | ICD-10-CM | POA: Insufficient documentation

## 2013-11-07 MED ORDER — ONDANSETRON HCL 4 MG/2ML IJ SOLN
4.0000 mg | Freq: Once | INTRAMUSCULAR | Status: AC
  Administered 2013-11-07: 4 mg via INTRAVENOUS
  Filled 2013-11-07: qty 2

## 2013-11-07 MED ORDER — SODIUM CHLORIDE 0.9 % IV BOLUS (SEPSIS)
1000.0000 mL | Freq: Once | INTRAVENOUS | Status: AC
Start: 2013-11-07 — End: 2013-11-07
  Administered 2013-11-07: 1000 mL via INTRAVENOUS

## 2013-11-07 MED ORDER — PROPOFOL 10 MG/ML IV BOLUS
INTRAVENOUS | Status: AC
  Filled 2013-11-07: qty 20

## 2013-11-07 MED ORDER — HYDROCODONE-ACETAMINOPHEN 5-325 MG PO TABS: 2.0000 | ORAL_TABLET | ORAL | Status: DC | PRN

## 2013-11-07 MED ORDER — FENTANYL CITRATE 0.05 MG/ML IJ SOLN
50.0000 ug | Freq: Once | INTRAMUSCULAR | Status: AC
  Administered 2013-11-07: 50 ug via INTRAVENOUS
  Filled 2013-11-07: qty 2

## 2013-11-07 MED ORDER — FENTANYL CITRATE 0.05 MG/ML IJ SOLN
100.0000 ug | Freq: Once | INTRAMUSCULAR | Status: AC
  Administered 2013-11-07: 100 ug via INTRAVENOUS
  Filled 2013-11-07: qty 2

## 2013-11-07 MED ORDER — MORPHINE SULFATE 4 MG/ML IJ SOLN
4.0000 mg | Freq: Once | INTRAMUSCULAR | Status: AC
  Administered 2013-11-07: 4 mg via INTRAVENOUS
  Filled 2013-11-07: qty 1

## 2013-11-07 MED ORDER — MORPHINE SULFATE 2 MG/ML IJ SOLN
2.0000 mg | Freq: Once | INTRAMUSCULAR | Status: AC
  Administered 2013-11-07: 2 mg via INTRAVENOUS
  Filled 2013-11-07: qty 1

## 2013-11-07 MED ORDER — PROPOFOL 10 MG/ML IV EMUL
20.0000 mL | INTRAVENOUS | Status: DC
  Administered 2013-11-07: 120 mg via INTRAVENOUS

## 2013-11-07 NOTE — ED Provider Notes (Addendum)
CSN: [id number]     Arrival date & time 11/07/13  1546 History   First MD Initiated Contact with Patient 11/07/13 1552     Chief Complaint  Patient presents with  . Fall  . Shoulder Injury     (Consider location/radiation/quality/duration/timing/severity/associated sxs/prior Treatment) Patient is a 80 y.o. female presenting with fall and shoulder injury. The history is provided by the patient.  Fall This is a new problem. The current episode started today. The problem occurs constantly. The problem has been gradually worsening. Associated symptoms include joint swelling and myalgias. Pertinent negatives include no neck pain. Nothing aggravates the symptoms. She has tried nothing for the symptoms. The treatment provided moderate relief.  Shoulder Injury Associated symptoms include joint swelling and myalgias. Pertinent negatives include no neck pain.    History reviewed. No pertinent past medical history. Past Surgical History  Procedure Laterality Date  . Hernia repair    . Tubal ligation    . Cholecystectomy    . Cholecystectomy  08/09/2011    Procedure: LAPAROSCOPIC CHOLECYSTECTOMY;  Surgeon: Robyne AskewPaul S Toth III, MD;  Location: [facility];  Service: General;  Laterality: N/A;   No family history on file. History  Substance Use Topics  . Smoking status: Never Smoker   . Smokeless tobacco: Not on file  . Alcohol Use: No   OB History   Grav Para Term Preterm Abortions TAB SAB Ect Mult Living                 Review of Systems  Musculoskeletal: Positive for joint swelling and myalgias. Negative for neck pain.  All other systems reviewed and are negative.     Allergies  Review of patient's allergies indicates no known allergies.  Home Medications   Prior to Admission medications   Medication Sig Start Date End Date Taking? Authorizing Provider  calcium carbonate (OS-CAL) 600 MG TABS Take 600 mg by mouth daily.    Historical Provider, MD  Cholecalciferol (VITAMIN D-3) 5000  UNITS TABS Take 1 tablet by mouth daily.    Historical Provider, MD  Garlic 1000 MG CAPS Take 1 capsule by mouth daily.    Historical Provider, MD  Ginkgo Biloba 40 MG TABS Take 1 tablet by mouth daily.    Historical Provider, MD  Lutein 20 MG CAPS Take 1 capsule by mouth daily.    Historical Provider, MD  Multiple Vitamin (MULITIVITAMIN WITH MINERALS) TABS Take 1 tablet by mouth daily.    Historical Provider, MD  Oil of Oregano 1500 MG CAPS Take 1 capsule by mouth daily.    Historical Provider, MD  Spirulina 500 MG TABS Take 1 tablet by mouth daily.    Historical Provider, MD  Turmeric 450 MG CAPS Take 1 capsule by mouth daily.    Historical Provider, MD   BP 192/78  Pulse 78  Temp(Src) 97.9 F (36.6 C) (Oral)  Resp 18  SpO2 96% Physical Exam  Nursing note and vitals reviewed. Constitutional: She is oriented to person, place, and time. She appears well-developed and well-nourished.  HENT:  Head: Normocephalic and atraumatic.  Neck: Normal range of motion.  Cardiovascular: Normal rate.   Pulmonary/Chest: Effort normal.  Musculoskeletal: She exhibits tenderness.  Deformity right shoulder, good pulses  Neurological: She is alert and oriented to person, place, and time. She has normal reflexes.  Skin: Skin is warm.  Psychiatric: She has a normal mood and affect.    ED Course  Reduction of dislocation Date/Time: 11/07/2013 7:26 PM Performed by:  Darlis Wragg K Authorized by: Elson AreasSOFIA, Prescilla Monger K Consent: Verbal consent obtained. Consent given by: patient and spouse Patient understanding: patient states understanding of the procedure being performed Patient consent: the patient's understanding of the procedure matches consent given Procedure consent: procedure consent matches procedure scheduled Relevant documents: relevant documents present and verified Patient identity confirmed: verbally with patient and hospital-assigned identification number Time out: Immediately prior to  procedure a "time out" was called to verify the correct patient, procedure, equipment, support staff and site/side marked as required. Local anesthesia used: no Patient sedated: yes Sedatives: propofol Analgesia: fentanyl and morphine Sedation start date/time: 11/08/2013 5:55 PM Sedation end date/time: 11/08/2013 6:20 PM Vitals: Vital signs were monitored during sedation. Patient tolerance: Patient tolerated the procedure well with no immediate complications. Comments: Sedation by Dr. Fonnie JarvisBednar.   Shoulder reduced easily with traction and massage   (including critical care time) Labs Review Labs Reviewed - No data to display  Imaging Review Dg Shoulder Right  11/07/2013   CLINICAL DATA:  Status post fall with right shoulder pain  EXAM: RIGHT SHOULDER - 2+ VIEW  COMPARISON:  None.  FINDINGS: There is anterior inferior dislocation of the right humerus. No acute abnormalities identified within the visualized ribs. Soft tissues are unremarkable.  IMPRESSION: Anterior inferior dislocation the right humerus.   Electronically Signed   By: Sherian ReinWei-Chen  Lin M.D.   On: 11/07/2013 17:50   Dg Humerus Right  11/07/2013   CLINICAL DATA:  Status post fall with right upper arm pain  EXAM: RIGHT HUMERUS - 2+ VIEW  COMPARISON:  None.  FINDINGS: There is anterior dislocation of the right shoulder. There is no fracture of the right humerus.  IMPRESSION: Anterior dislocation right shoulder. No fracture of the right humerus.   Electronically Signed   By: Sherian ReinWei-Chen  Lin M.D.   On: 11/07/2013 17:51     EKG Interpretation None      MDM   Final diagnoses:  Dislocation of shoulder, right, closed     Pt observed after procedure,  Awake and alert.   Pt placedin sling.  I advised follow up with orthopaedist for evaluation   Elson AreasLeslie K Nieko Clarin, PA-C 11/07/13 1930  Lonia SkinnerLeslie K Indian WellsSofia, PA-C 11/07/13 1936  Lonia SkinnerLeslie K [place], [place]PA-C 11/16/13 1409

## 2013-11-07 NOTE — ED Notes (Signed)
Pain medication given for right shoulder pain rated 10/10.  Patient moaning, hyperventilating.  Encouraged to slow her respiratory rate.

## 2013-11-07 NOTE — ED Provider Notes (Signed)
Medical screening examination/treatment/procedure(s) were conducted as a shared visit with non-physician practitioner(s) and myself.  I personally evaluated the patient during the encounter.  Airway patent and maintained; lungs clear bilaterally unlabored; cardiac regular rate and rhythm without audible murmur; abdomen soft and nontender; right arm capillary refill less than 2 seconds normal light touch good movement to right hand nontender right hand wrist and elbow but has tenderness with obvious deformity consistent with closed dislocation right shoulder  Pre-sedation "time-out," performed.  Provider confirms review of the nurses' note, allergies, medications, PMH, pre-induction vital signs with pulse oximetry, pain level, pertinent labs, imaging, and ECG (as applicable) and patient condition satisfactory for commencing with order for sedation and procedure.  Agents used in sedation:  propofol. 1755  Patient tolerated procedure and procedural sedation component as expected without apparent immediate complications.  Physician confirms procedural medication orders as administered, patient was assessed by physician post-procedure, and confirms post-sedation plan of care and disposition. Using gentle massage and manipulation right shoulder easily reduced one attempt by PA Sophia and myself with no apparent immediate complications patient feels much better awake alert following simple commands after procedure radial pulse intact right wrist patient moving right hand well. [id number]  Kelly HornJohn M Jillene Wehrenberg, MD 11/08/13 1450

## 2013-11-07 NOTE — Discharge Instructions (Signed)

## 2013-11-07 NOTE — ED Notes (Signed)
Patient here by POV for fall after slipping and falling onto right shoulder hitting wood floor. Denies loc, complains only of right shoulder and humerus pain with movement, positive distal pulses, no obvious deformity

## 2013-11-19 NOTE — ED Provider Notes (Signed)
Medical screening examination/treatment/procedure(s) were conducted as a shared visit with non-physician practitioner(s) and myself.  I personally evaluated the patient during the encounter.   EKG Interpretation None       Hurman Horn, MD 11/19/13 2316

## 2013-11-25 ENCOUNTER — Ambulatory Visit: Payer: Medicare Other | Attending: Orthopedic Surgery | Admitting: Rehabilitation

## 2013-11-25 DIAGNOSIS — S43006A Unspecified dislocation of unspecified shoulder joint, initial encounter: Secondary | ICD-10-CM | POA: Diagnosis not present

## 2013-11-25 DIAGNOSIS — X58XXXA Exposure to other specified factors, initial encounter: Secondary | ICD-10-CM | POA: Insufficient documentation

## 2013-11-25 DIAGNOSIS — IMO0001 Reserved for inherently not codable concepts without codable children: Secondary | ICD-10-CM | POA: Insufficient documentation

## 2013-11-25 DIAGNOSIS — M25519 Pain in unspecified shoulder: Secondary | ICD-10-CM | POA: Insufficient documentation

## 2013-12-02 ENCOUNTER — Ambulatory Visit: Payer: Medicare Other | Admitting: Rehabilitation

## 2013-12-02 DIAGNOSIS — IMO0001 Reserved for inherently not codable concepts without codable children: Secondary | ICD-10-CM | POA: Diagnosis not present

## 2013-12-03 ENCOUNTER — Ambulatory Visit: Payer: Medicare Other | Admitting: Rehabilitation

## 2013-12-03 DIAGNOSIS — IMO0001 Reserved for inherently not codable concepts without codable children: Secondary | ICD-10-CM | POA: Diagnosis not present

## 2013-12-14 ENCOUNTER — Ambulatory Visit: Payer: Medicare Other | Admitting: Rehabilitation

## 2013-12-14 DIAGNOSIS — IMO0001 Reserved for inherently not codable concepts without codable children: Secondary | ICD-10-CM | POA: Diagnosis not present

## 2013-12-16 ENCOUNTER — Ambulatory Visit: Payer: Medicare Other | Admitting: Rehabilitation

## 2013-12-16 DIAGNOSIS — IMO0001 Reserved for inherently not codable concepts without codable children: Secondary | ICD-10-CM | POA: Diagnosis not present

## 2013-12-21 ENCOUNTER — Ambulatory Visit: Payer: Medicare Other | Admitting: Rehabilitation

## 2013-12-21 DIAGNOSIS — IMO0001 Reserved for inherently not codable concepts without codable children: Secondary | ICD-10-CM | POA: Diagnosis not present

## 2013-12-23 ENCOUNTER — Ambulatory Visit: Payer: Medicare Other | Attending: Orthopedic Surgery | Admitting: Rehabilitation

## 2013-12-23 ENCOUNTER — Ambulatory Visit: Payer: Medicare Other | Admitting: Rehabilitation

## 2013-12-23 DIAGNOSIS — M25519 Pain in unspecified shoulder: Secondary | ICD-10-CM | POA: Insufficient documentation

## 2013-12-23 DIAGNOSIS — IMO0001 Reserved for inherently not codable concepts without codable children: Secondary | ICD-10-CM | POA: Diagnosis present

## 2013-12-23 DIAGNOSIS — W19XXXA Unspecified fall, initial encounter: Secondary | ICD-10-CM | POA: Insufficient documentation

## 2013-12-23 DIAGNOSIS — S43006A Unspecified dislocation of unspecified shoulder joint, initial encounter: Secondary | ICD-10-CM | POA: Insufficient documentation

## 2013-12-24 ENCOUNTER — Ambulatory Visit: Payer: Medicare Other | Admitting: Rehabilitation

## 2013-12-30 ENCOUNTER — Ambulatory Visit: Payer: Medicare Other | Admitting: Rehabilitation

## 2013-12-30 DIAGNOSIS — IMO0001 Reserved for inherently not codable concepts without codable children: Secondary | ICD-10-CM | POA: Diagnosis not present

## 2014-01-04 ENCOUNTER — Ambulatory Visit: Payer: Medicare Other | Admitting: Physical Therapy

## 2014-01-04 DIAGNOSIS — IMO0001 Reserved for inherently not codable concepts without codable children: Secondary | ICD-10-CM | POA: Diagnosis not present

## 2014-01-06 ENCOUNTER — Ambulatory Visit: Payer: Medicare Other | Admitting: Rehabilitation

## 2014-01-06 DIAGNOSIS — IMO0001 Reserved for inherently not codable concepts without codable children: Secondary | ICD-10-CM | POA: Diagnosis not present

## 2014-01-11 ENCOUNTER — Ambulatory Visit: Payer: Medicare Other | Admitting: Rehabilitation

## 2014-01-11 DIAGNOSIS — IMO0001 Reserved for inherently not codable concepts without codable children: Secondary | ICD-10-CM | POA: Diagnosis not present

## 2014-01-14 ENCOUNTER — Ambulatory Visit: Payer: Medicare Other | Admitting: Rehabilitation

## 2014-01-14 DIAGNOSIS — IMO0001 Reserved for inherently not codable concepts without codable children: Secondary | ICD-10-CM | POA: Diagnosis not present

## 2015-07-28 DIAGNOSIS — H353211 Exudative age-related macular degeneration, right eye, with active choroidal neovascularization: Secondary | ICD-10-CM | POA: Diagnosis not present

## 2015-07-28 DIAGNOSIS — Z961 Presence of intraocular lens: Secondary | ICD-10-CM | POA: Diagnosis not present

## 2015-07-28 DIAGNOSIS — H353221 Exudative age-related macular degeneration, left eye, with active choroidal neovascularization: Secondary | ICD-10-CM | POA: Diagnosis not present

## 2015-07-28 DIAGNOSIS — H353113 Nonexudative age-related macular degeneration, right eye, advanced atrophic without subfoveal involvement: Secondary | ICD-10-CM | POA: Diagnosis not present

## 2015-07-28 DIAGNOSIS — H40003 Preglaucoma, unspecified, bilateral: Secondary | ICD-10-CM | POA: Diagnosis not present

## 2015-07-28 DIAGNOSIS — Q825 Congenital non-neoplastic nevus: Secondary | ICD-10-CM | POA: Diagnosis not present

## 2015-08-29 DIAGNOSIS — H353211 Exudative age-related macular degeneration, right eye, with active choroidal neovascularization: Secondary | ICD-10-CM | POA: Diagnosis not present

## 2015-08-29 DIAGNOSIS — H353221 Exudative age-related macular degeneration, left eye, with active choroidal neovascularization: Secondary | ICD-10-CM | POA: Diagnosis not present

## 2015-09-28 DIAGNOSIS — H353211 Exudative age-related macular degeneration, right eye, with active choroidal neovascularization: Secondary | ICD-10-CM | POA: Diagnosis not present

## 2015-09-28 DIAGNOSIS — H353221 Exudative age-related macular degeneration, left eye, with active choroidal neovascularization: Secondary | ICD-10-CM | POA: Diagnosis not present

## 2015-11-02 DIAGNOSIS — H353221 Exudative age-related macular degeneration, left eye, with active choroidal neovascularization: Secondary | ICD-10-CM | POA: Diagnosis not present

## 2015-11-02 DIAGNOSIS — H353211 Exudative age-related macular degeneration, right eye, with active choroidal neovascularization: Secondary | ICD-10-CM | POA: Diagnosis not present

## 2015-12-07 DIAGNOSIS — H353211 Exudative age-related macular degeneration, right eye, with active choroidal neovascularization: Secondary | ICD-10-CM | POA: Diagnosis not present

## 2015-12-07 DIAGNOSIS — H353221 Exudative age-related macular degeneration, left eye, with active choroidal neovascularization: Secondary | ICD-10-CM | POA: Diagnosis not present

## 2016-02-03 DIAGNOSIS — H353211 Exudative age-related macular degeneration, right eye, with active choroidal neovascularization: Secondary | ICD-10-CM | POA: Diagnosis not present

## 2016-02-03 DIAGNOSIS — Z961 Presence of intraocular lens: Secondary | ICD-10-CM | POA: Diagnosis not present

## 2016-02-03 DIAGNOSIS — H353221 Exudative age-related macular degeneration, left eye, with active choroidal neovascularization: Secondary | ICD-10-CM | POA: Diagnosis not present

## 2016-12-10 DIAGNOSIS — G8929 Other chronic pain: Secondary | ICD-10-CM | POA: Diagnosis not present

## 2016-12-10 DIAGNOSIS — M25551 Pain in right hip: Secondary | ICD-10-CM | POA: Diagnosis not present

## 2016-12-10 DIAGNOSIS — M545 Low back pain: Secondary | ICD-10-CM | POA: Diagnosis not present

## 2016-12-14 DIAGNOSIS — E559 Vitamin D deficiency, unspecified: Secondary | ICD-10-CM | POA: Insufficient documentation

## 2016-12-14 DIAGNOSIS — M7061 Trochanteric bursitis, right hip: Secondary | ICD-10-CM | POA: Diagnosis not present

## 2016-12-14 DIAGNOSIS — M25561 Pain in right knee: Secondary | ICD-10-CM | POA: Diagnosis not present

## 2016-12-14 DIAGNOSIS — M5136 Other intervertebral disc degeneration, lumbar region: Secondary | ICD-10-CM | POA: Diagnosis not present

## 2016-12-14 DIAGNOSIS — M545 Low back pain: Secondary | ICD-10-CM | POA: Diagnosis not present

## 2016-12-14 DIAGNOSIS — G8929 Other chronic pain: Secondary | ICD-10-CM | POA: Diagnosis not present

## 2016-12-27 DIAGNOSIS — M5136 Other intervertebral disc degeneration, lumbar region: Secondary | ICD-10-CM | POA: Diagnosis not present

## 2017-01-21 DIAGNOSIS — R7303 Prediabetes: Secondary | ICD-10-CM | POA: Diagnosis not present

## 2017-01-21 DIAGNOSIS — I440 Atrioventricular block, first degree: Secondary | ICD-10-CM | POA: Diagnosis not present

## 2017-01-21 DIAGNOSIS — M25511 Pain in right shoulder: Secondary | ICD-10-CM | POA: Diagnosis not present

## 2017-01-21 DIAGNOSIS — R0789 Other chest pain: Secondary | ICD-10-CM | POA: Diagnosis not present

## 2017-01-21 DIAGNOSIS — R5382 Chronic fatigue, unspecified: Secondary | ICD-10-CM | POA: Diagnosis not present

## 2017-01-21 DIAGNOSIS — E559 Vitamin D deficiency, unspecified: Secondary | ICD-10-CM | POA: Diagnosis not present

## 2017-01-21 DIAGNOSIS — N904 Leukoplakia of vulva: Secondary | ICD-10-CM | POA: Diagnosis not present

## 2017-01-21 DIAGNOSIS — R42 Dizziness and giddiness: Secondary | ICD-10-CM | POA: Diagnosis not present

## 2017-01-22 DIAGNOSIS — N904 Leukoplakia of vulva: Secondary | ICD-10-CM | POA: Insufficient documentation

## 2017-01-22 DIAGNOSIS — I440 Atrioventricular block, first degree: Secondary | ICD-10-CM | POA: Insufficient documentation

## 2017-01-22 DIAGNOSIS — R7303 Prediabetes: Secondary | ICD-10-CM | POA: Insufficient documentation

## 2017-01-22 DIAGNOSIS — G8929 Other chronic pain: Secondary | ICD-10-CM | POA: Insufficient documentation

## 2017-01-22 DIAGNOSIS — M25511 Pain in right shoulder: Secondary | ICD-10-CM

## 2017-03-04 DIAGNOSIS — Z0000 Encounter for general adult medical examination without abnormal findings: Secondary | ICD-10-CM | POA: Diagnosis not present

## 2017-03-14 DIAGNOSIS — G8929 Other chronic pain: Secondary | ICD-10-CM | POA: Diagnosis not present

## 2017-03-14 DIAGNOSIS — M7061 Trochanteric bursitis, right hip: Secondary | ICD-10-CM | POA: Diagnosis not present

## 2017-03-14 DIAGNOSIS — M545 Low back pain: Secondary | ICD-10-CM | POA: Diagnosis not present

## 2017-03-18 DIAGNOSIS — R42 Dizziness and giddiness: Secondary | ICD-10-CM | POA: Diagnosis not present

## 2017-03-18 DIAGNOSIS — I440 Atrioventricular block, first degree: Secondary | ICD-10-CM | POA: Diagnosis not present

## 2017-03-18 DIAGNOSIS — R0789 Other chest pain: Secondary | ICD-10-CM | POA: Diagnosis not present

## 2017-03-28 DIAGNOSIS — G8929 Other chronic pain: Secondary | ICD-10-CM | POA: Diagnosis not present

## 2017-03-28 DIAGNOSIS — M545 Low back pain: Secondary | ICD-10-CM | POA: Diagnosis not present

## 2017-03-28 DIAGNOSIS — M7061 Trochanteric bursitis, right hip: Secondary | ICD-10-CM | POA: Diagnosis not present

## 2017-04-10 DIAGNOSIS — M545 Low back pain: Secondary | ICD-10-CM | POA: Diagnosis not present

## 2017-04-10 DIAGNOSIS — M5136 Other intervertebral disc degeneration, lumbar region: Secondary | ICD-10-CM | POA: Diagnosis not present

## 2017-04-10 DIAGNOSIS — M4310 Spondylolisthesis, site unspecified: Secondary | ICD-10-CM | POA: Diagnosis not present

## 2017-04-10 DIAGNOSIS — G8929 Other chronic pain: Secondary | ICD-10-CM | POA: Diagnosis not present

## 2017-04-17 ENCOUNTER — Ambulatory Visit: Payer: PPO | Attending: Physical Medicine and Rehabilitation | Admitting: Physical Therapy

## 2017-04-17 DIAGNOSIS — G8929 Other chronic pain: Secondary | ICD-10-CM | POA: Insufficient documentation

## 2017-04-17 DIAGNOSIS — M545 Low back pain: Secondary | ICD-10-CM | POA: Diagnosis not present

## 2017-04-17 DIAGNOSIS — M25551 Pain in right hip: Secondary | ICD-10-CM

## 2017-04-17 DIAGNOSIS — M6281 Muscle weakness (generalized): Secondary | ICD-10-CM | POA: Diagnosis not present

## 2017-04-17 DIAGNOSIS — R29898 Other symptoms and signs involving the musculoskeletal system: Secondary | ICD-10-CM | POA: Diagnosis not present

## 2017-04-17 NOTE — Therapy (Signed)
[facility] [address]  [address] [place], [place], [address] Phone: [phone]   Fax:  [phone]  Physical Therapy Evaluation  Patient Details  Name: [name] MRN: [id number] Date of Birth: [birth year] Referring Provider: Dr. Sheran Luz  Encounter Date: 04/17/2017      PT End of Session - 04/17/17 1730    Visit Number 1   Number of Visits 12   Date for PT Re-Evaluation 05/29/17   Authorization Type HT advantage   PT Start Time 1318   PT Stop Time 1403   PT Time Calculation (min) 45 min   Activity Tolerance Patient tolerated treatment well   Behavior During Therapy [facility] for tasks assessed/performed      No past medical history on file.  Past Surgical History:  Procedure Laterality Date  . CHOLECYSTECTOMY    . CHOLECYSTECTOMY  08/09/2011   Procedure: LAPAROSCOPIC CHOLECYSTECTOMY;  Surgeon: Robyne Askew, MD;  Location: [facility];  Service: General;  Laterality: N/A;  . HERNIA REPAIR    . TUBAL LIGATION      There were no vitals filed for this visit.       Subjective Assessment - 04/17/17 1319    Subjective patient reporting she does not walk very well - when she works all day she can barely move. Patient uses "rolly chair" within her home from room to room. Feels well when sitting, lying down. Has a recliner at home, and feels well resting in that. Back pain biggest complaint. Reports some bursitis pain at R hip. Feels like she has a bad R knee. Has had back pain for 2-3 years. Denies N&T into legs, denies bowel and bladder involvement.    Patient is accompained by: Family member   Limitations Standing;Walking   How long can you sit comfortably? no issue   How long can you stand comfortably? 2-3 minutes   How long can you walk comfortably? 2-3 minutes   Diagnostic tests xray - hip: negative; lumbar - arthritis, anteriolisthesis   Patient Stated Goals improve walking, pain   Currently in Pain? Yes   Pain Score --  1-2/10 seated; 7-8/10 with prolonged walking   Pain Location Back   Pain Orientation Right;Left;Lower   Pain Descriptors / Indicators Aching   Pain Type Chronic pain   Pain Onset More than a month ago   Pain Frequency Intermittent   Aggravating Factors  prolonged walking and standing   Pain Relieving Factors tylenol            [facility] PT Assessment - 04/17/17 1325      Assessment   Medical Diagnosis Acquired spondylolisthesis; degenerative lumbar disc, chronic bilateral low back pain without sciatica   Referring Provider Dr. Sheran Luz   Onset Date/Surgical Date --  ~3 years ago   Next MD Visit prn   Prior Therapy no     Precautions   Precautions None     Restrictions   Weight Bearing Restrictions No     Balance Screen   Has the patient fallen in the past 6 months No   Has the patient had a decrease in activity level because of a fear of falling?  No   Is the patient reluctant to leave their home because of a fear of falling?  No     Home [employer] information [employer] manager residence   Living Arrangements Spouse/significant other   Home Equipment [place] - single point   Additional  Comments furniture/wall walk; stair chair     Prior Function   Level of Independence Independent   Vocation Retired   Psychologist, counselling     Cognition   Overall Cognitive Status Within Functional Limits for tasks assessed     Observation/Other Assessments   Focus on Therapeutic Outcomes (FOTO)  lumbar spine 45 (55% limited; predicted 42% limited)     Sensation   Light Touch Appears Intact     Coordination   Gross Motor Movements are Fluid and Coordinated Yes     Posture/Postural Control   Posture/Postural Control Postural limitations   Postural Limitations Rounded Shoulders;Forward head;Increased lumbar lordosis     ROM / Strength   AROM / PROM / Strength AROM;Strength     AROM   AROM Assessment Site Lumbar   Lumbar Flexion WFL, no pain    Lumbar Extension 25% limited, no pain   Lumbar - Right Side Bend WFL, no pain   Lumbar - Left Side Bend WFL, no pain   Lumbar - Right Rotation WFL, no pain   Lumbar - Left Rotation WFL, no pain     Strength   Strength Assessment Site Hip;Knee   Right/Left Hip Right;Left   Right Hip Flexion 4-/5   Right Hip ABduction 3-/5   Left Hip Flexion 4-/5   Left Hip ABduction 3-/5   Right/Left Knee Right;Left   Right Knee Flexion 4-/5   Right Knee Extension 4-/5   Left Knee Flexion 4-/5   Left Knee Extension 4-/5     Flexibility   Soft Tissue Assessment /Muscle Length yes   Hamstrings B tightness; ~60 degrees 90/90 test     Palpation   Palpation comment TTP to R lateral thigh, R GT, R glute, R lumbar paraspinals      Ambulation/Gait   Ambulation/Gait Yes   Ambulation/Gait Assistance 6: Modified independent (Device/Increase time)   Ambulation Distance (Feet) 100 Feet   Assistive device Straight cane  tendency to carry cane rather than use appropriately    Gait Comments B knee valgus forces            Objective measurements completed on examination: See above findings.                  PT Education - 04/17/17 1729    Education provided Yes   Education Details Educated on exam findings, POC.   Person(s) Educated Patient;Spouse   Methods Explanation   Comprehension Verbalized understanding;Need further instruction          PT Short Term Goals - 04/17/17 1737      PT SHORT TERM GOAL #1   Title Patient to be independent with initial HEP   Status New   Target Date 05/08/17           PT Long Term Goals - 04/17/17 1738      PT LONG TERM GOAL #1   Title Patient to demonstrate/report ability to stand and/or ambulate for >/= 10 minutes with pain no greater than 2/10   Status New   Target Date 05/29/17     PT LONG TERM GOAL #2   Title Patient to improve BLE strength to >/= 4/5 for improved functional mobility   Status New   Target Date 05/29/17      PT LONG TERM GOAL #3   Title Patient to report initiation and maintenance of walking program for 3-5 days/wk   Status New   Target Date 05/29/17     PT LONG  TERM GOAL #4   Title Patient will improve B hamstring flexibility by 75 degrees at 90/90 test   Status New   Target Date 05/29/17                Plan - 04/17/17 1731    Clinical Impression Statement Patient is an 83 y/o female presenting to [facility] with cheif complaints of low back pain with no radicular symptoms. Patient reports limitations in dialy functional mobility with inability to stand or ambulate for prolonged periods due to pain. Patient today demonstrating proximal hip weakness, reduced flexibility at BLE. as well as some poor movement patterns such as sit to supine, likely contributing to overall pain pattern. Patient and significant other heavily educated on POC and need for compliance for true benefit of PT with good verbal understanding. Patient to benefit from PT to address deficits listed above for improved funcitonal mobility.    Clinical Presentation Stable   Clinical Decision Making Low   Rehab Potential Good   PT Frequency 2x / week   PT Duration 6 weeks   PT Treatment/Interventions ADLs/Self Care Home Management;Iontophoresis 4mg /ml Dexamethasone;Cryotherapy;Electrical Stimulation;Moist Heat;Ultrasound;Neuromuscular re-education;Therapeutic exercise;Therapeutic activities;Functional mobility training;Gait training;Patient/family education;Manual techniques;Passive range of motion;Taping;Dry needling   Consulted and Agree with Plan of Care Patient      Patient will benefit from skilled therapeutic intervention in order to improve the following deficits and impairments:  Decreased activity tolerance, Decreased mobility, Difficulty walking, Decreased strength, Pain  Visit Diagnosis: Chronic bilateral low back pain without sciatica - Plan: PT plan of care cert/re-cert  Pain in right hip - Plan: PT plan of care  cert/re-cert  Muscle weakness (generalized) - Plan: PT plan of care cert/re-cert  Other symptoms and signs involving the musculoskeletal system - Plan: PT plan of care cert/re-cert      G-Codes - 04/17/17 1742    Functional Assessment Tool Used (Outpatient Only) FOTO 45 (55% limited)   Functional Limitation Mobility: Walking and moving around   Mobility: Walking and Moving Around Current Status ([id number]) At least 40 percent but less than 60 percent impaired, limited or restricted   Mobility: Walking and Moving Around Goal Status (U0454(G8979) At least 40 percent but less than 60 percent impaired, limited or restricted       Problem List Patient Active Problem List   Diagnosis Date Noted  . Cholelithiasis with acute or chronic cholecystitis 08/10/2011  . Obesity (BMI 30.0-34.9) 08/10/2011    Kipp LaurenceStephanie R Aaron, PT, DPT 04/17/17 5:46 PM   [facility] [address]  [address] [place], [place], [address] Phone: [phone]   Fax:  [phone]  Name: Sharee HolsterSandra S Leyland MRN: [id number] Date of Birth: [birth year]

## 2017-04-22 ENCOUNTER — Ambulatory Visit: Payer: PPO | Admitting: Physical Therapy

## 2017-04-22 DIAGNOSIS — M545 Low back pain: Secondary | ICD-10-CM | POA: Diagnosis not present

## 2017-04-22 DIAGNOSIS — M25551 Pain in right hip: Secondary | ICD-10-CM

## 2017-04-22 DIAGNOSIS — G8929 Other chronic pain: Secondary | ICD-10-CM

## 2017-04-22 DIAGNOSIS — M6281 Muscle weakness (generalized): Secondary | ICD-10-CM

## 2017-04-22 DIAGNOSIS — R29898 Other symptoms and signs involving the musculoskeletal system: Secondary | ICD-10-CM

## 2017-04-22 NOTE — Therapy (Signed)
[facility] [address]  [address] [place], [place], [address] Phone: [phone]   Fax:  [phone]  Physical Therapy Treatment  Patient Details  Name: [name] MRN: [id number] Date of Birth: 05-27-1934 Referring Provider: Dr. Sheran Luz  Encounter Date: 04/22/2017      PT End of Session - 04/22/17 1621    Visit Number 2   Number of Visits 12   Date for PT Re-Evaluation 05/29/17   Authorization Type HT advantage   PT Start Time 1618   PT Stop Time 1700   PT Time Calculation (min) 42 min   Activity Tolerance Patient tolerated treatment well   Behavior During Therapy [facility] for tasks assessed/performed      No past medical history on file.  Past Surgical History:  Procedure Laterality Date  . CHOLECYSTECTOMY    . CHOLECYSTECTOMY  08/09/2011   Procedure: LAPAROSCOPIC CHOLECYSTECTOMY;  Surgeon: Robyne Askew, MD;  Location: [facility];  Service: General;  Laterality: N/A;  . HERNIA REPAIR    . TUBAL LIGATION      There were no vitals filed for this visit.      Subjective Assessment - 04/22/17 1620    Subjective pain still with walking - at rest feels well.    Patient is accompained by: Family member   Diagnostic tests xray - hip: negative; lumbar - arthritis, anteriolisthesis   Patient Stated Goals improve walking, pain   Currently in Pain? No/denies   Pain Score 0-No pain                         [facility] Adult PT Treatment/Exercise - 04/22/17 1630      Exercises   Exercises Lumbar     Lumbar Exercises: Aerobic   Stationary Bike NuStep: L3 x 6 min     Lumbar Exercises: Supine   Ab Set 15 reps;3 seconds   AB Set Limitations VC and TC for proper movement   Clam 10 reps;3 seconds   Clam Limitations no resistance   Bent Knee Raise 10 reps;3 seconds   Bent Knee Raise Limitations + ab set; alternating   Bridge 15 reps;3 seconds   Straight Leg Raise 10 reps   Straight Leg Raises  Limitations B + core engagement     Lumbar Exercises: Sidelying   Hip Abduction 10 reps   Hip Abduction Limitations B; manual assist from PT                  PT Short Term Goals - 04/22/17 1621      PT SHORT TERM GOAL #1   Title Patient to be independent with initial HEP   Status On-going           PT Long Term Goals - 04/22/17 1630      PT LONG TERM GOAL #1   Title Patient to demonstrate/report ability to stand and/or ambulate for >/= 10 minutes with pain no greater than 2/10   Status On-going     PT LONG TERM GOAL #2   Title Patient to improve BLE strength to >/= 4/5 for improved functional mobility   Status On-going     PT LONG TERM GOAL #3   Title Patient to report initiation and maintenance of walking program for 3-5 days/wk   Status On-going     PT LONG TERM GOAL #4   Title Patient will improve B hamstring flexibility by 75 degrees  at 90/90 test   Status On-going               Plan - 04/22/17 1743    Clinical Impression Statement Patient doing well today. PT session focusing on initiating hip and core strength needed for good lumbopelvic stability with good tolerance. Most difficulty today with sidelying hip abduction with patient unable to lift against gravity through full ROM requiring assist from PT. Heavy VC and TC throughout session to ensure appropriate muscle activation. Will continue to progress as patient tolerates.    PT Treatment/Interventions ADLs/Self Care Home Management;Iontophoresis 4mg /ml Dexamethasone;Cryotherapy;Electrical Stimulation;Moist Heat;Ultrasound;Neuromuscular re-education;Therapeutic exercise;Therapeutic activities;Functional mobility training;Gait training;Patient/family education;Manual techniques;Passive range of motion;Taping;Dry needling   Consulted and Agree with Plan of Care Patient      Patient will benefit from skilled therapeutic intervention in order to improve the following deficits and impairments:   Decreased activity tolerance, Decreased mobility, Difficulty walking, Decreased strength, Pain  Visit Diagnosis: Chronic bilateral low back pain without sciatica  Pain in right hip  Muscle weakness (generalized)  Other symptoms and signs involving the musculoskeletal system     Problem List Patient Active Problem List   Diagnosis Date Noted  . Cholelithiasis with acute or chronic cholecystitis 08/10/2011  . Obesity (BMI 30.0-34.9) 08/10/2011     Kipp LaurenceStephanie R Aaron, PT, DPT 04/22/17 5:45 PM   [facility] [place] [address]  [address] [place], [place], [address] Phone: [phone]   Fax:  [phone]  Name: [name] MRN: [id number] Date of Birth: 14-Jun-1934

## 2017-04-22 NOTE — Patient Instructions (Signed)
Strengthening: Straight Leg Raise (Phase 1)   Tighten muscles on front of right thigh, then lift leg __6-8__ inches from surface, keeping knee locked.  Repeat __15__ times per set. Do __2__ sets per session.   Pelvic Tilt: Posterior - Legs Bent (Supine)   Tighten stomach and flatten back by rolling pelvis down. Hold __5-10__ seconds. Relax. Repeat _15___ times per set.   Bridge   Lie back, legs bent. Inhale, pressing hips up. Keeping ribs in, lengthen lower back. Exhale, rolling down along spine from top. Repeat __15__ times. Do __2__ sessions per day.

## 2017-04-25 ENCOUNTER — Ambulatory Visit: Payer: PPO | Admitting: Physical Therapy

## 2017-04-29 ENCOUNTER — Ambulatory Visit: Payer: PPO | Attending: Physical Medicine and Rehabilitation | Admitting: Physical Therapy

## 2017-04-29 ENCOUNTER — Encounter: Payer: Self-pay | Admitting: Physical Therapy

## 2017-04-29 DIAGNOSIS — M545 Low back pain: Secondary | ICD-10-CM | POA: Insufficient documentation

## 2017-04-29 DIAGNOSIS — R29898 Other symptoms and signs involving the musculoskeletal system: Secondary | ICD-10-CM | POA: Insufficient documentation

## 2017-04-29 DIAGNOSIS — M6281 Muscle weakness (generalized): Secondary | ICD-10-CM | POA: Diagnosis not present

## 2017-04-29 DIAGNOSIS — M25551 Pain in right hip: Secondary | ICD-10-CM | POA: Diagnosis not present

## 2017-04-29 DIAGNOSIS — G8929 Other chronic pain: Secondary | ICD-10-CM | POA: Insufficient documentation

## 2017-04-29 NOTE — Therapy (Signed)
[facility] [address]  [address] [place], [place], [address] Phone: [phone]   Fax:  [phone]  Physical Therapy Treatment  Patient Details  Name: [name] MRN: [id number] Date of Birth: 10-02-1933 Referring Provider: Dr. Sheran Luzichard Ramos   Encounter Date: 04/29/2017  PT End of Session - 04/29/17 1709    Visit Number  3    Number of Visits  12    Date for PT Re-Evaluation  05/29/17    Authorization Type  HT advantage    PT Start Time  1620    PT Stop Time  1659    PT Time Calculation (min)  39 min    Activity Tolerance  Patient tolerated treatment well    Behavior During Therapy  [facility] for tasks assessed/performed       History reviewed. No pertinent past medical history.  Past Surgical History:  Procedure Laterality Date  . CHOLECYSTECTOMY    . HERNIA REPAIR    . TUBAL LIGATION      There were no vitals filed for this visit.  Subjective Assessment - 04/29/17 1622    Subjective  Patient reports feeling well today and believes she is already seeing improvements in mobility. Reporting some soreness in hamstrings after last visit but less pain overall. Patient reporting good compliance with HEP.    Currently in Pain?  Yes    Pain Score  1     Pain Location  Back    Pain Orientation  Right;Left;Lower    Pain Descriptors / Indicators  Aching    Pain Type  Chronic pain                      [facility] Adult PT Treatment/Exercise - 04/29/17 1620      Lumbar Exercises: Aerobic   Stationary Bike  NuStep: L3 x 6 min      Lumbar Exercises: Seated   Long Arc Quad on [place]  Right;Left;15 reps    Hip Flexion on Ball  Right;Left;15 reps heavy guarding to pt for safety   heavy guarding to pt for safety   Sit to Stand  15 reps VC to pt for sequencing, positioning; no use of UE support   VC to pt for sequencing, positioning; no use of UE support   Other Seated Lumbar Exercises  Alt. UE flexion on green  ball; 15 reps each    Other Seated Lumbar Exercises  hamstring curl; sitting on foam pad; red tband; 15 reps each      Lumbar Exercises: Supine   Bridge  15 reps;3 seconds ball between knees   ball between knees     Lumbar Exercises: Sidelying   Hip Abduction  10 reps    Hip Abduction Limitations  B; manual assist from PT               PT Short Term Goals - 04/22/17 1621      PT SHORT TERM GOAL #1   Title  Patient to be independent with initial HEP    Status  On-going        PT Long Term Goals - 04/22/17 1630      PT LONG TERM GOAL #1   Title  Patient to demonstrate/report ability to stand and/or ambulate for >/= 10 minutes with pain no greater than 2/10    Status  On-going      PT LONG TERM GOAL #2   Title  Patient to  improve BLE strength to >/= 4/5 for improved functional mobility    Status  On-going      PT LONG TERM GOAL #3   Title  Patient to report initiation and maintenance of walking program for 3-5 days/wk    Status  On-going      PT LONG TERM GOAL #4   Title  Patient will improve B hamstring flexibility by 75 degrees at 90/90 test    Status  On-going            Plan - 04/29/17 1710    Clinical Impression Statement  Patient progressing well. Patient able to tolerate adding minimal resistance to some exercises, however still having most difficulty with sidelying hip abduction and still requiring manual assist R>L when lifting against gravity. Heavy VC necessary during sit to stands to encourage patient to use legs and core appropriately in order to avoid UE pull to stand at home.     PT Treatment/Interventions  ADLs/Self Care Home Management;Iontophoresis 4mg /ml Dexamethasone;Cryotherapy;Electrical Stimulation;Moist Heat;Ultrasound;Neuromuscular re-education;Therapeutic exercise;Therapeutic activities;Functional mobility training;Gait training;Patient/family education;Manual techniques;Passive range of motion;Taping;Dry needling       Patient will  benefit from skilled therapeutic intervention in order to improve the following deficits and impairments:  Decreased activity tolerance, Decreased mobility, Difficulty walking, Decreased strength, Pain  Visit Diagnosis: Chronic bilateral low back pain without sciatica  Pain in right hip  Muscle weakness (generalized)  Other symptoms and signs involving the musculoskeletal system     Problem List Patient Active Problem List   Diagnosis Date Noted  . Cholelithiasis with acute or chronic cholecystitis 08/10/2011  . Obesity (BMI 30.0-34.9) 08/10/2011     Emerson Monte, SPT 04/29/17 5:24 PM    [facility] [address]  [address] [place], [place], [address] Phone: [phone]   Fax:  [phone]  Name: [name] MRN: [id number] Date of Birth: 1934-06-13

## 2017-05-01 ENCOUNTER — Ambulatory Visit: Payer: PPO

## 2017-05-01 DIAGNOSIS — M6281 Muscle weakness (generalized): Secondary | ICD-10-CM

## 2017-05-01 DIAGNOSIS — M545 Low back pain: Principal | ICD-10-CM

## 2017-05-01 DIAGNOSIS — M25551 Pain in right hip: Secondary | ICD-10-CM

## 2017-05-01 DIAGNOSIS — G8929 Other chronic pain: Secondary | ICD-10-CM

## 2017-05-01 DIAGNOSIS — R29898 Other symptoms and signs involving the musculoskeletal system: Secondary | ICD-10-CM

## 2017-05-01 NOTE — Therapy (Signed)
[facility] [place] [address]  [address] [place], [place], [address] Phone: [phone]   Fax:  [phone]  Physical Therapy Treatment  Patient Details  Name: [name] MRN: [id number] Date of Birth: [birth year] Referring Provider: Dr. Sheran Luzichard Ramos   Encounter Date: 05/01/2017  PT End of Session - 05/01/17 1536    Visit Number  4    Number of Visits  12    Date for PT Re-Evaluation  05/29/17    Authorization Type  HT advantage    PT Start Time  1530    PT Stop Time  1610    PT Time Calculation (min)  40 min    Activity Tolerance  Patient tolerated treatment well    Behavior During Therapy  [facility] for tasks assessed/performed       No past medical history on file.  Past Surgical History:  Procedure Laterality Date  . CHOLECYSTECTOMY    . HERNIA REPAIR    . TUBAL LIGATION      There were no vitals filed for this visit.  Subjective Assessment - 05/01/17 1534    Subjective  Pt. doing well today with no problems with HEP.      Patient Stated Goals  improve walking, pain    Currently in Pain?  Yes    Pain Score  2  up to 6-7/10 with standing and walking    up to 6-7/10 with standing and walking    Pain Location  Back    Pain Orientation  Right;Left;Lower    Pain Descriptors / Indicators  Aching;Constant    Pain Type  Chronic pain    Pain Onset  More than a month ago    Pain Frequency  Intermittent    Aggravating Factors   prolonged walking and standing    Pain Relieving Factors  laying on back    Multiple Pain Sites  No                      [facility] Adult PT Treatment/Exercise - 05/01/17 1535      Lumbar Exercises: Aerobic   Stationary Bike  NuStep: L4 x 6 min      Lumbar Exercises: Standing   Heel Raises  10 reps    Heel Raises Limitations  light support on counter     Row  15 reps;Theraband    Theraband Level (Row)  Level 2 (Red)    Row Limitations  therapist anchored; cues for scapular  retraction/depression      Lumbar Exercises: Seated   Long Arc Quad on Chair  Right;Left;10 reps    LAQ on Chair Limitations  with adduction ball squeeze     Sit to Stand  15 reps    Other Seated Lumbar Exercises  hamstring curl; sitting on foam pad; red tband; 15 reps each      Lumbar Exercises: Supine   Clam  10 reps;3 seconds    Clam Limitations  abdom brace + red TB at knees     Bent Knee Raise  10 reps;3 seconds    Bent Knee Raise Limitations  + ab set; alternating tactile cueing required to maintain abdom. bracing/breathing   tactile cueing required to maintain abdom. bracing/breathing   Other Supine Lumbar Exercises  Hooklying bridge with sustained hip abd/ER isometrics into red TB x 10               PT Short Term Goals - 04/22/17 1621  PT SHORT TERM GOAL #1   Title  Patient to be independent with initial HEP    Status  On-going        PT Long Term Goals - 04/22/17 1630      PT LONG TERM GOAL #1   Title  Patient to demonstrate/report ability to stand and/or ambulate for >/= 10 minutes with pain no greater than 2/10    Status  On-going      PT LONG TERM GOAL #2   Title  Patient to improve BLE strength to >/= 4/5 for improved functional mobility    Status  On-going      PT LONG TERM GOAL #3   Title  Patient to report initiation and maintenance of walking program for 3-5 days/wk    Status  On-going      PT LONG TERM GOAL #4   Title  Patient will improve B hamstring flexibility by 75 degrees at 90/90 test    Status  On-going            Plan - 05/01/17 1536    Clinical Impression Statement  [name] doing well today and reports, "I feel therapy is helping".  Tolerated mild progression of lumbopelvic strengthening activities without increased pain.  Added standing heel raise without issue today.  Required verbal and/or tactile cueing with all lumbopelvic strengthening activities for proper core engagement today.  Some difficulty maintaining core  engagement and will benefit from further skilled instruction with this for improved pelvic stability.      PT Treatment/Interventions  ADLs/Self Care Home Management;Iontophoresis 4mg /ml Dexamethasone;Cryotherapy;Electrical Stimulation;Moist Heat;Ultrasound;Neuromuscular re-education;Therapeutic exercise;Therapeutic activities;Functional mobility training;Gait training;Patient/family education;Manual techniques;Passive range of motion;Taping;Dry needling       Patient will benefit from skilled therapeutic intervention in order to improve the following deficits and impairments:  Decreased activity tolerance, Decreased mobility, Difficulty walking, Decreased strength, Pain  Visit Diagnosis: Chronic bilateral low back pain without sciatica  Pain in right hip  Muscle weakness (generalized)  Other symptoms and signs involving the musculoskeletal system     Problem List Patient Active Problem List   Diagnosis Date Noted  . Cholelithiasis with acute or chronic cholecystitis 08/10/2011  . Obesity (BMI 30.0-34.9) 08/10/2011    Kermit BaloMicah Amed Datta, PTA 05/01/17 4:28 PM  [facility] [place] [address]  [address] [place], [place], [address] Phone: [phone]   Fax:  [phone]  Name: [name] MRN: [id number] Date of Birth: 10/25/1933

## 2017-05-02 ENCOUNTER — Ambulatory Visit: Payer: PPO

## 2017-05-06 ENCOUNTER — Ambulatory Visit: Payer: PPO

## 2017-05-06 DIAGNOSIS — M6281 Muscle weakness (generalized): Secondary | ICD-10-CM

## 2017-05-06 DIAGNOSIS — R29898 Other symptoms and signs involving the musculoskeletal system: Secondary | ICD-10-CM

## 2017-05-06 DIAGNOSIS — M25551 Pain in right hip: Secondary | ICD-10-CM

## 2017-05-06 DIAGNOSIS — M545 Low back pain, unspecified: Secondary | ICD-10-CM

## 2017-05-06 DIAGNOSIS — G8929 Other chronic pain: Secondary | ICD-10-CM

## 2017-05-06 NOTE — Therapy (Signed)
[facility] [place] [address]  [address] [place], [place], [address] Phone: [phone]   Fax:  [phone]  Physical Therapy Treatment  Patient Details  Name: [name] MRN: [id number] Date of Birth: 10-25-1933 Referring Provider: Dr. Sheran Luzichard Ramos   Encounter Date: 05/06/2017  PT End of Session - 05/06/17 1618    Visit Number  5    Number of Visits  12    Date for PT Re-Evaluation  05/29/17    Authorization Type  HT advantage    PT Start Time  1615    PT Stop Time  1658    PT Time Calculation (min)  43 min    Activity Tolerance  Patient tolerated treatment well    Behavior During Therapy  [facility] for tasks assessed/performed       No past medical history on file.  Past Surgical History:  Procedure Laterality Date  . CHOLECYSTECTOMY    . HERNIA REPAIR    . TUBAL LIGATION      There were no vitals filed for this visit.  Subjective Assessment - 05/06/17 1618    Subjective  Pt. reporting felt good following last visit.      Patient Stated Goals  improve walking, pain    Currently in Pain?  No/denies    Pain Score  0-No pain    Multiple Pain Sites  No                      [facility] Adult PT Treatment/Exercise - 05/06/17 1629      Lumbar Exercises: Stretches   Passive Hamstring Stretch  2 reps;30 seconds    Passive Hamstring Stretch Limitations  supine with strap; seated       Lumbar Exercises: Aerobic   Stationary Bike  NuStep: L4 x 6 min      Lumbar Exercises: Standing   Other Standing Lumbar Exercises  alternating toe-touch to 6" step x 15 reps each LE; 1 light UE support on chair       Lumbar Exercises: Seated   Long Arc Quad on Chair  Right;Left;10 reps added to HEP     Sit to Stand  15 reps added to HEP     Other Seated Lumbar Exercises  B Fitter leg press (2 blue bands) x 10 reps each     Other Seated Lumbar Exercises  abdominal brace 5" x 10 reps      Lumbar Exercises: Supine   Ab Set   15 reps;3 seconds    AB Set Limitations  TC for proper movement    Clam  3 seconds;15 reps added to HEP     Clam Limitations  abdom brace + red TB at knees     Bent Knee Raise  3 seconds;15 reps added to HEP     Bent Knee Raise Limitations  + ab set; alternating red TB at knees       Knee/Hip Exercises: Standing   Knee Flexion  Right;Left;10 reps;Strengthening             PT Education - 05/06/17 1841    Education provided  Yes    Education Details  HEP update red looped TB issued to pt.     Person(s) Educated  Patient    Methods  Explanation;Demonstration;Verbal cues;Handout    Comprehension  Verbalized understanding;Returned demonstration;Verbal cues required;Need further instruction       PT Short Term Goals - 04/22/17 1621  PT SHORT TERM GOAL #1   Title  Patient to be independent with initial HEP    Status  On-going        PT Long Term Goals - 04/22/17 1630      PT LONG TERM GOAL #1   Title  Patient to demonstrate/report ability to stand and/or ambulate for >/= 10 minutes with pain no greater than 2/10    Status  On-going      PT LONG TERM GOAL #2   Title  Patient to improve BLE strength to >/= 4/5 for improved functional mobility    Status  On-going      PT LONG TERM GOAL #3   Title  Patient to report initiation and maintenance of walking program for 3-5 days/wk    Status  On-going      PT LONG TERM GOAL #4   Title  Patient will improve B hamstring flexibility by 75 degrees at 90/90 test    Status  On-going            Plan - 05/06/17 1620    Clinical Impression Statement  [name] reporting she felt good following last visit and reporting she is pain free to start treatment.  HEP updated today to include basic lumbopelvic strengthening and LE stretching.  Pt. able to demo all updated HEP activities without issue however does still require additional cueing for consistant abdominal activation with therex.  [name] progressing well toward goals at this  point.      PT Treatment/Interventions  ADLs/Self Care Home Management;Iontophoresis 4mg /ml Dexamethasone;Cryotherapy;Electrical Stimulation;Moist Heat;Ultrasound;Neuromuscular re-education;Therapeutic exercise;Therapeutic activities;Functional mobility training;Gait training;Patient/family education;Manual techniques;Passive range of motion;Taping;Dry needling    Consulted and Agree with Plan of Care  Patient       Patient will benefit from skilled therapeutic intervention in order to improve the following deficits and impairments:  Decreased activity tolerance, Decreased mobility, Difficulty walking, Decreased strength, Pain  Visit Diagnosis: Chronic bilateral low back pain without sciatica  Pain in right hip  Muscle weakness (generalized)  Other symptoms and signs involving the musculoskeletal system     Problem List Patient Active Problem List   Diagnosis Date Noted  . Cholelithiasis with acute or chronic cholecystitis 08/10/2011  . Obesity (BMI 30.0-34.9) 08/10/2011    Kermit BaloMicah Wilburta Milbourn, PTA 05/06/17 6:43 PM  [facility] [place] [address]  [address] [place], [place], [address] Phone: [phone]   Fax:  [phone]  Name: [name] MRN: [id number] Date of Birth: 1934-05-26

## 2017-05-08 ENCOUNTER — Ambulatory Visit: Payer: PPO

## 2017-05-08 DIAGNOSIS — M545 Low back pain: Principal | ICD-10-CM

## 2017-05-08 DIAGNOSIS — M25551 Pain in right hip: Secondary | ICD-10-CM

## 2017-05-08 DIAGNOSIS — G8929 Other chronic pain: Secondary | ICD-10-CM

## 2017-05-08 DIAGNOSIS — R29898 Other symptoms and signs involving the musculoskeletal system: Secondary | ICD-10-CM

## 2017-05-08 DIAGNOSIS — M6281 Muscle weakness (generalized): Secondary | ICD-10-CM

## 2017-05-08 NOTE — Patient Instructions (Signed)

## 2017-05-08 NOTE — Therapy (Signed)
[facility] [place] [address]  [address] [place], [place], [address] Phone: [phone]   Fax:  [phone]  Physical Therapy Treatment  Patient Details  Name: Kelly HolsterSandra S [name] MRN: [id number] Date of Birth: [birth year] Referring Provider: Dr. Sheran Luzichard Ramos   Encounter Date: 05/08/2017  PT End of Session - 05/08/17 1604    Visit Number  6    Number of Visits  12    Date for PT Re-Evaluation  05/29/17    Authorization Type  HT advantage    PT Start Time  1533    PT Stop Time  1618    PT Time Calculation (min)  45 min    Activity Tolerance  Patient tolerated treatment well    Behavior During Therapy  [facility] for tasks assessed/performed       No past medical history on file.  Past Surgical History:  Procedure Laterality Date  . CHOLECYSTECTOMY    . HERNIA REPAIR    . TUBAL LIGATION      There were no vitals filed for this visit.  Subjective Assessment - 05/08/17 1535    Subjective  Pt. reporting some leg soreness following performing HEP yesterday and today.     Diagnostic tests  xray - hip: negative; lumbar - arthritis, anteriolisthesis    Patient Stated Goals  improve walking, pain    Currently in Pain?  Yes    Pain Score  1     Pain Location  Leg hamstring muscular soreness     Pain Orientation  Posterior;Right;Left    Pain Descriptors / Indicators  Sore reports muscular soreness following HEP performance     Multiple Pain Sites  No                      [facility] Adult PT Treatment/Exercise - 05/08/17 1545      Lumbar Exercises: Stretches   Passive Hamstring Stretch  2 reps;30 seconds cueing for pt. to keep knee in extension     Passive Hamstring Stretch Limitations  supine with strap; seated       Lumbar Exercises: Aerobic   Stationary Bike  NuStep: L5  x 6 min      Lumbar Exercises: Seated   Other Seated Lumbar Exercises  B Fitter leg press (1 blue, 1 black band) x 10 reps each       Lumbar  Exercises: Supine   Other Supine Lumbar Exercises  Hooklying bridge with sustained hip abd/ER isometrics into red TB x 15      Knee/Hip Exercises: Standing   Knee Flexion  Right;Left;10 reps;Strengthening      Modalities   Modalities  Iontophoresis      Iontophoresis   Type of Iontophoresis  Dexamethasone    Location  R GT trochanter     Dose  1.590mL, 180mA-min     Time  4-6 hour wear time       Manual Therapy   Manual Therapy  Soft tissue mobilization;Myofascial release    Manual therapy comments  L sidelying with R LE elevated on bolster     Soft tissue mobilization  Gentle STM to lateral quad/ITB; multiple TP's with pt. very tender throughout ITB; ttp over R GT    Myofascial Release  TPR to R VL mid and distal; TP's throughout              PT Education - 05/08/17 1620    Education provided  Yes  Education Details  iontophoresis education sheet including contraindications/precations    Person(s) Educated  Patient    Methods  Explanation;Verbal cues;Handout    Comprehension  Verbalized understanding;Verbal cues required;Need further instruction       PT Short Term Goals - 04/22/17 1621      PT SHORT TERM GOAL #1   Title  Patient to be independent with initial HEP    Status  On-going        PT Long Term Goals - 04/22/17 1630      PT LONG TERM GOAL #1   Title  Patient to demonstrate/report ability to stand and/or ambulate for >/= 10 minutes with pain no greater than 2/10    Status  On-going      PT LONG TERM GOAL #2   Title  Patient to improve BLE strength to >/= 4/5 for improved functional mobility    Status  On-going      PT LONG TERM GOAL #3   Title  Patient to report initiation and maintenance of walking program for 3-5 days/wk    Status  On-going      PT LONG TERM GOAL #4   Title  Patient will improve B hamstring flexibility by 75 degrees at 90/90 test    Status  On-going            Plan - 05/08/17 2041    Clinical Impression Statement  Pt.  reporting some muscular soreness in B HS following performance of HEP yesterday and today.  Tolerated all LE strengthening activities well today.  Pt. tender to palpation throughout R VL/ITB with STM today and with limited tolerance for TPR.  Pt. instructed in how to perform self strumming/STM with rolling pin to lateral thigh musculature at home.  Pt. very TTP at R greater trochanter today and instructed on possible benefit of trial of iontophoresis to this area.  MD signed order for iontophoresis thus pt. issued education handout including ionto precautions/contraindications and pt. verbalizing understanding of wear time.  Iontophoresis patch #1/6 applied to R GT to end treatment for hopeful reduction in pain.      PT Treatment/Interventions  ADLs/Self Care Home Management;Iontophoresis 4mg /ml Dexamethasone;Cryotherapy;Electrical Stimulation;Moist Heat;Ultrasound;Neuromuscular re-education;Therapeutic exercise;Therapeutic activities;Functional mobility training;Gait training;Patient/family education;Manual techniques;Passive range of motion;Taping;Dry needling    Consulted and Agree with Plan of Care  Patient       Patient will benefit from skilled therapeutic intervention in order to improve the following deficits and impairments:  Decreased activity tolerance, Decreased mobility, Difficulty walking, Decreased strength, Pain  Visit Diagnosis: Chronic bilateral low back pain without sciatica  Pain in right hip  Muscle weakness (generalized)  Other symptoms and signs involving the musculoskeletal system     Problem List Patient Active Problem List   Diagnosis Date Noted  . Cholelithiasis with acute or chronic cholecystitis 08/10/2011  . Obesity (BMI 30.0-34.9) 08/10/2011    Kelly BaloMicah Grafton [name], PTA 05/08/17 8:56 PM  [facility] [place] [address]  [address] [place], [place], [address] Phone: [phone]   Fax:  [phone]  Name: Kelly HolsterSandra S  Noteboom MRN: [id number] Date of Birth: 1933/11/06

## 2017-05-09 ENCOUNTER — Ambulatory Visit: Payer: PPO | Admitting: Physical Therapy

## 2017-05-13 ENCOUNTER — Ambulatory Visit: Payer: PPO

## 2017-05-13 DIAGNOSIS — M545 Low back pain, unspecified: Secondary | ICD-10-CM

## 2017-05-13 DIAGNOSIS — M6281 Muscle weakness (generalized): Secondary | ICD-10-CM

## 2017-05-13 DIAGNOSIS — G8929 Other chronic pain: Secondary | ICD-10-CM

## 2017-05-13 DIAGNOSIS — M25551 Pain in right hip: Secondary | ICD-10-CM

## 2017-05-13 DIAGNOSIS — R29898 Other symptoms and signs involving the musculoskeletal system: Secondary | ICD-10-CM

## 2017-05-13 NOTE — Therapy (Addendum)
[facility] [address]  [address] [place], [place], [address] Phone: [phone]   Fax:  [phone]  Physical Therapy Treatment  Patient Details  Name: [name] MRN: [id number] Date of Birth: April 02, 1934 Referring Provider: Dr. Sheran Luz   Encounter Date: 05/13/2017  PT End of Session - 05/13/17 1626    Visit Number  7    Number of Visits  12    Date for PT Re-Evaluation  05/29/17    Authorization Type  HT advantage    PT Start Time  1616    PT Stop Time  1700    PT Time Calculation (min)  44 min    Activity Tolerance  Patient tolerated treatment well    Behavior During Therapy  [facility] for tasks assessed/performed       No past medical history on file.  Past Surgical History:  Procedure Laterality Date  . CHOLECYSTECTOMY    . HERNIA REPAIR    . LAPAROSCOPIC CHOLECYSTECTOMY N/A 08/09/2011   Performed by Robyne Askew, MD at [facility] ORS  . TUBAL LIGATION      There were no vitals filed for this visit.  Subjective Assessment - 05/13/17 1619    Subjective  Pt. reporting benefit from patch applied last visit and was able to sleep on R side without pain last night.  Feels her LBP is not as intense now.      How long can you stand comfortably?  5 min    Diagnostic tests  xray - hip: negative; lumbar - arthritis, anteriolisthesis    Patient Stated Goals  improve walking, pain    Currently in Pain?  Yes    Pain Score  3     Pain Location  Hip    Pain Orientation  Right    Pain Descriptors / Indicators  Sore    Pain Type  Acute pain    Pain Onset  More than a month ago    Pain Frequency  Intermittent    Aggravating Factors   walking     Pain Relieving Factors  sitting     Multiple Pain Sites  Yes    Pain Score  3    Pain Location  Back    Pain Orientation  Lower    Pain Descriptors / Indicators  Aching    Pain Type  Chronic pain    Pain Onset  More than a month ago                       [facility] Adult PT Treatment/Exercise - 05/13/17 1621      Lumbar Exercises: Aerobic   Stationary Bike  Recumbent bike: lvl 1, 3 min  terminated due to knee pain thus Nustep: lvl 6, 3 min       Lumbar Exercises: Standing   Heel Raises  15 reps    Heel Raises Limitations  light support on chair       Lumbar Exercises: Seated   Long Arc Quad on Chair  Right;Left;10 reps    LAQ on Chair Limitations  2#       Lumbar Exercises: Supine   Bent Knee Raise  3 seconds;15 reps    Bent Knee Raise Limitations  red TB at knees + ab set; alternating    Straight Leg Raise  10 reps    Straight Leg Raises Limitations  B     Other Supine Lumbar Exercises  Hooklying bridge with sustained hip abd/ER isometrics into red TB x 15      Lumbar Exercises: Sidelying   Other Sidelying Lumbar Exercises  R clam shell without resistance x 10 reps; cues to prevent trunk rotation      Knee/Hip Exercises: Standing   Heel Raises  Both;15 reps    Heel Raises Limitations  chair support    Hip Abduction  Right;Left;10 reps;Knee straight    Abduction Limitations  standing on airex pad; chair support     Hip Extension  Right;Left;10 reps;Knee straight    Extension Limitations  standing on airex pad     Other Standing Knee Exercises  Alternating step up/over airex pad x 5 reps; chair support  terminated due to onset of R hip pain       Modalities   Modalities  Iontophoresis      Iontophoresis   Type of Iontophoresis  Dexamethasone    Location  R GT trochanter     Dose  1.190mL, 6480mA-min     Time  4-6 hour wear time  patch 2#/6             PT Education - 05/13/17 1835    Education provided  Yes    Education Details  heel raise     Person(s) Educated  Patient    Methods  Explanation;Demonstration;Verbal cues;Handout    Comprehension  Verbalized understanding;Returned demonstration;Verbal cues required;Need further instruction       PT Short Term Goals - 05/13/17 1647       PT SHORT TERM GOAL #1   Title  Patient to be independent with initial HEP    Status  Achieved        PT Long Term Goals - 04/22/17 1630      PT LONG TERM GOAL #1   Title  Patient to demonstrate/report ability to stand and/or ambulate for >/= 10 minutes with pain no greater than 2/10    Status  On-going      PT LONG TERM GOAL #2   Title  Patient to improve BLE strength to >/= 4/5 for improved functional mobility    Status  On-going      PT LONG TERM GOAL #3   Title  Patient to report initiation and maintenance of walking program for 3-5 days/wk    Status  On-going      PT LONG TERM GOAL #4   Title  Patient will improve B hamstring flexibility by 75 degrees at 90/90 test    Status  On-going            Plan - 05/13/17 1757    Clinical Impression Statement  Pt. reporting good benefit from iontophoresis patch applied to R hip last visit and was able to sleep on R side without pain.  Reports she feels overall LBP intensity is decreasing.  Still reports HEP is challenging.  Able to progress to more standing hip strengthening activities today however did have onset of R hip pain over GT area thus ionto patch 2#/6 applied to this area for hopeful pain relief.  Will continue to progress per pt. tolerance in coming visits.    PT Treatment/Interventions  ADLs/Self Care Home Management;Iontophoresis 4mg /ml Dexamethasone;Cryotherapy;Electrical Stimulation;Moist Heat;Ultrasound;Neuromuscular re-education;Therapeutic exercise;Therapeutic activities;Functional mobility training;Gait training;Patient/family education;Manual techniques;Passive range of motion;Taping;Dry needling    Consulted and Agree with Plan of Care  Patient       Patient will benefit from skilled therapeutic intervention in order to improve the following deficits and impairments:  Decreased activity tolerance, Decreased mobility, Difficulty walking, Decreased strength, Pain  Visit Diagnosis: Chronic bilateral low back pain  without sciatica  Pain in right hip  Muscle weakness (generalized)  Other symptoms and signs involving the musculoskeletal system     Problem List Patient Active Problem List   Diagnosis Date Noted  . Cholelithiasis with acute or chronic cholecystitis 08/10/2011  . Obesity (BMI 30.0-34.9) 08/10/2011    Kermit BaloMicah Kinzy Weyers, PTA 05/13/17 6:36 PM  [facility] [place] [address]  [address] [place], [place], [address] Phone: [phone]   Fax:  [phone]  Name: [name] MRN: [id number] Date of Birth: 11-Aug-1933

## 2017-05-15 ENCOUNTER — Ambulatory Visit: Payer: PPO

## 2017-05-15 DIAGNOSIS — G8929 Other chronic pain: Secondary | ICD-10-CM

## 2017-05-15 DIAGNOSIS — R29898 Other symptoms and signs involving the musculoskeletal system: Secondary | ICD-10-CM

## 2017-05-15 DIAGNOSIS — M545 Low back pain: Secondary | ICD-10-CM | POA: Diagnosis not present

## 2017-05-15 DIAGNOSIS — M25551 Pain in right hip: Secondary | ICD-10-CM

## 2017-05-15 DIAGNOSIS — M6281 Muscle weakness (generalized): Secondary | ICD-10-CM

## 2017-05-15 NOTE — Therapy (Signed)
[facility] [address]  [address] [place], [place], [address] Phone: [phone]   Fax:  [phone]  Physical Therapy Treatment  Patient Details  Name: [name] MRN: [id number] Date of Birth: 06-08-1934 Referring Provider: Dr. Sheran Luz   Encounter Date: 05/15/2017  PT End of Session - 05/15/17 1640    Visit Number  8    Number of Visits  12    Date for PT Re-Evaluation  05/29/17    Authorization Type  HT advantage    PT Start Time  1619    PT Stop Time  1700    PT Time Calculation (min)  41 min    Activity Tolerance  Patient tolerated treatment well    Behavior During Therapy  [facility] for tasks assessed/performed       No past medical history on file.  Past Surgical History:  Procedure Laterality Date  . CHOLECYSTECTOMY    . CHOLECYSTECTOMY  08/09/2011   Procedure: LAPAROSCOPIC CHOLECYSTECTOMY;  Surgeon: Robyne Askew, MD;  Location: [facility];  Service: General;  Laterality: N/A;  . HERNIA REPAIR    . TUBAL LIGATION      There were no vitals filed for this visit.  Subjective Assessment - 05/15/17 1622    Subjective  Pt. reporting good relief from ionto patch applied last visit able to sleep on R side.  Pt. reporting she feels like intensity of LBP has decreased with therapy while walking and feels she uses furniture/walls less around house for support.      Diagnostic tests  xray - hip: negative; lumbar - arthritis, anteriolisthesis    Patient Stated Goals  improve walking, pain    Currently in Pain?  Yes    Pain Score  2     Pain Location  Back    Pain Orientation  Lower    Pain Descriptors / Indicators  Aching    Pain Type  Acute pain    Pain Onset  More than a month ago    Pain Frequency  Intermittent    Aggravating Factors   walking     Pain Relieving Factors  sitting     Multiple Pain Sites  No                      [facility] Adult PT Treatment/Exercise - 05/15/17 1626       Self-Care   Self-Care  Other Self-Care Comments    Other Self-Care Comments   Explanation and review of self-roller stick to distal R quads over area of tenderness with rationale provided to pt. as to hopeful decrease in tenderness and improvement in tissue quality with rolling with rolling pin at home       Lumbar Exercises: Aerobic   Stationary Bike  NuStep: lvl 5, 6 min       Lumbar Exercises: Standing   Other Standing Lumbar Exercises  Side stepping with yellow TB at ankles 4 x 10 ft at counter       Lumbar Exercises: Seated   Other Seated Lumbar Exercises  B Fitter leg press (1 blue, 1 black band) x 15 reps each     Other Seated Lumbar Exercises  Alternating LE march with yellow TB at knees x 10 reps each side       Knee/Hip Exercises: Standing   Forward Step Up  Step Height: 4";Right;Left;10 reps;Hand Hold: 2    Forward Step Up Limitations  1  UE on counter; 1 HH support from therapist     Step Down  Step Height: 4";Right;Left;10 reps;Hand Hold: 2    Step Down Limitations  limited control with eccentric       Iontophoresis   Type of Iontophoresis  Dexamethasone    Location  R GT trochanter     Dose  1.440mL, 3480mA-min     Time  4-6 hour wear time  3#/6       Manual Therapy   Manual Therapy  Soft tissue mobilization;Myofascial release    Manual therapy comments  seated     Soft tissue mobilization  Gentle STM to lateral quad; multiple TP's however pt. reports tenderness has imporved some; mild tenderness at R GT    Myofascial Release  TPR to R VL mid and distal; palpable TP's; good tolerance                PT Short Term Goals - 05/13/17 1647      PT SHORT TERM GOAL #1   Title  Patient to be independent with initial HEP    Status  Achieved        PT Long Term Goals - 04/22/17 1630      PT LONG TERM GOAL #1   Title  Patient to demonstrate/report ability to stand and/or ambulate for >/= 10 minutes with pain no greater than 2/10    Status  On-going      PT LONG  TERM GOAL #2   Title  Patient to improve BLE strength to >/= 4/5 for improved functional mobility    Status  On-going      PT LONG TERM GOAL #3   Title  Patient to report initiation and maintenance of walking program for 3-5 days/wk    Status  On-going      PT LONG TERM GOAL #4   Title  Patient will improve B hamstring flexibility by 75 degrees at 90/90 test    Status  On-going            Plan - 05/15/17 1640    Clinical Impression Statement  Dois [name] reporting benefit from ionto patch applied last visit and able to sleep on R side with less pain.  Reports able to walk around house with less UE assistance from furniture/walls.  Feels intensity of LBP has decreased since starting therapy.  Tolerated some advancement of standing strengthening activities today without increase in LBP/hip pain.  Still tender in R lateral quad and at R GT thus performed STM/TPR to distal quad and ended treatment with application of ionto patch to R hip.  Pt. reporting daily adherence to HEP.  Will monitor response to ionto patch and progress lumbopelvic/LE strengthening per pt. tolerance in coming visits.      PT Treatment/Interventions  ADLs/Self Care Home Management;Iontophoresis 4mg /ml Dexamethasone;Cryotherapy;Electrical Stimulation;Moist Heat;Ultrasound;Neuromuscular re-education;Therapeutic exercise;Therapeutic activities;Functional mobility training;Gait training;Patient/family education;Manual techniques;Passive range of motion;Taping;Dry needling    Consulted and Agree with Plan of Care  Patient       Patient will benefit from skilled therapeutic intervention in order to improve the following deficits and impairments:  Decreased activity tolerance, Decreased mobility, Difficulty walking, Decreased strength, Pain  Visit Diagnosis: Chronic bilateral low back pain without sciatica  Pain in right hip  Muscle weakness (generalized)  Other symptoms and signs involving the musculoskeletal  system     Problem List Patient Active Problem List   Diagnosis Date Noted  . Cholelithiasis with acute or chronic cholecystitis 08/10/2011  . Obesity (  BMI 30.0-34.9) 08/10/2011    [name], PTA 05/16/17 12:18 PM  [facility] [address]  [address] [place], [place], [address] Phone: [phone]   Fax:  [phone]  Name: JEWEL MCAFEE MRN: [id number] Date of Birth: 02/25/1934

## 2017-05-20 ENCOUNTER — Ambulatory Visit: Payer: PPO

## 2017-05-20 DIAGNOSIS — M545 Low back pain: Secondary | ICD-10-CM | POA: Diagnosis not present

## 2017-05-20 DIAGNOSIS — G8929 Other chronic pain: Secondary | ICD-10-CM

## 2017-05-20 DIAGNOSIS — R29898 Other symptoms and signs involving the musculoskeletal system: Secondary | ICD-10-CM

## 2017-05-20 DIAGNOSIS — M6281 Muscle weakness (generalized): Secondary | ICD-10-CM

## 2017-05-20 DIAGNOSIS — M25551 Pain in right hip: Secondary | ICD-10-CM

## 2017-05-20 NOTE — Therapy (Signed)
[facility] [address]  [address] [place], [place], [address] Phone: [phone]   Fax:  [phone]  Physical Therapy Treatment  Patient Details  Name: [name] MRN: [id number] Date of Birth: 03/23/1934 Referring Provider: Dr. Sheran Luzichard Ramos   Encounter Date: 05/20/2017  PT End of Session - 05/20/17 1549    Visit Number  9    Number of Visits  12    Date for PT Re-Evaluation  05/29/17    Authorization Type  HT advantage    PT Start Time  1533    PT Stop Time  1615    PT Time Calculation (min)  42 min    Activity Tolerance  Patient tolerated treatment well    Behavior During Therapy  [facility] for tasks assessed/performed       No past medical history on file.  Past Surgical History:  Procedure Laterality Date  . CHOLECYSTECTOMY    . CHOLECYSTECTOMY  08/09/2011   Procedure: LAPAROSCOPIC CHOLECYSTECTOMY;  Surgeon: Robyne AskewPaul S Toth III, MD;  Location: [facility];  Service: General;  Laterality: N/A;  . HERNIA REPAIR    . TUBAL LIGATION      There were no vitals filed for this visit.  Subjective Assessment - 05/20/17 1534    Subjective  Pt. reporting lower back pain is improving and she is using furniture less to navigate home.  Pt. reporting ionto patch provided some relief from last visit.      Diagnostic tests  xray - hip: negative; lumbar - arthritis, anteriolisthesis    Patient Stated Goals  improve walking, pain    Currently in Pain?  Yes    Pain Score  2     Pain Location  Back    Pain Orientation  Lower    Pain Descriptors / Indicators  Aching    Pain Type  Acute pain    Pain Onset  More than a month ago    Pain Frequency  Intermittent    Aggravating Factors   walking     Pain Relieving Factors  sitting     Multiple Pain Sites  No                      [facility] Adult PT Treatment/Exercise - 05/20/17 1544      Lumbar Exercises: Standing   Other Standing Lumbar Exercises  Side stepping with 2# at  ankles x 3 laps down/back at counter; cues to prevent "dragging" LE      Lumbar Exercises: Seated   Long Arc Quad on Chair  Right;Left;15 reps 2" hold at top     LAQ on Chair Limitations  adduction ball 2#     Other Seated Lumbar Exercises  B HS curl with red TB x 15 reps with adducion ball squeeze    Other Seated Lumbar Exercises  Alternating LE march with yellow TB at knees x 15 reps each side       Lumbar Exercises: Sidelying   Hip Abduction  10 reps    Hip Abduction Limitations  Required manual assist last 5 reps on L and full set on R     Other Sidelying Lumbar Exercises  R clam shell with yellow TB at knees x 15 reps      Knee/Hip Exercises: Standing   Hip Abduction  Right;Left;10 reps;Knee straight added to HEP     Abduction Limitations  chair support     Hip Extension  Right;Left;10  reps;Knee straight added to HEP    Extension Limitations  chair support     Forward Step Up  Step Height: 4";Right;Left;10 reps;Hand Hold: 2 Pt. verbalizing LE fatigue following this     Forward Step Up Limitations  1 UE on counter; 1 HH support from therapist     Other Standing Knee Exercises  Alternating toe-clears to 8" step x 15 reps each side ; light chair support                PT Short Term Goals - 05/13/17 1647      PT SHORT TERM GOAL #1   Title  Patient to be independent with initial HEP    Status  Achieved        PT Long Term Goals - 04/22/17 1630      PT LONG TERM GOAL #1   Title  Patient to demonstrate/report ability to stand and/or ambulate for >/= 10 minutes with pain no greater than 2/10    Status  On-going      PT LONG TERM GOAL #2   Title  Patient to improve BLE strength to >/= 4/5 for improved functional mobility    Status  On-going      PT LONG TERM GOAL #3   Title  Patient to report initiation and maintenance of walking program for 3-5 days/wk    Status  On-going      PT LONG TERM GOAL #4   Title  Patient will improve B hamstring flexibility by 75 degrees  at 90/90 test    Status  On-going            Plan - 05/20/17 1600    Clinical Impression Statement  Pt. reporting she feels LBP is improving and she has greater ease navigating home without as much need from UE assistance.  Tolerated addition of leg press well today however still requiring assistance with sidelying hip abduction due to weakness.  Still with visible instability with gait likely due to remaining LE weakness.  Reports benefit from ionto patch applied last visit and no longer TTP at R GT thus ionto deferred today.  Progressing well toward goals.      PT Treatment/Interventions  ADLs/Self Care Home Management;Iontophoresis 4mg /ml Dexamethasone;Cryotherapy;Electrical Stimulation;Moist Heat;Ultrasound;Neuromuscular re-education;Therapeutic exercise;Therapeutic activities;Functional mobility training;Gait training;Patient/family education;Manual techniques;Passive range of motion;Taping;Dry needling       Patient will benefit from skilled therapeutic intervention in order to improve the following deficits and impairments:  Decreased activity tolerance, Decreased mobility, Difficulty walking, Decreased strength, Pain  Visit Diagnosis: Chronic bilateral low back pain without sciatica  Pain in right hip  Muscle weakness (generalized)  Other symptoms and signs involving the musculoskeletal system     Problem List Patient Active Problem List   Diagnosis Date Noted  . Cholelithiasis with acute or chronic cholecystitis 08/10/2011  . Obesity (BMI 30.0-34.9) 08/10/2011    Kermit BaloMicah Edra Riccardi, PTA 05/20/17 9:14 PM  [facility] [place] [address]  [address] [place], [place], [address] Phone: [phone]   Fax:  [phone]  Name: [name] MRN: [id number] Date of Birth: 09/29/1933

## 2017-05-22 ENCOUNTER — Ambulatory Visit: Payer: PPO

## 2017-05-22 DIAGNOSIS — M25551 Pain in right hip: Secondary | ICD-10-CM

## 2017-05-22 DIAGNOSIS — M545 Low back pain: Principal | ICD-10-CM

## 2017-05-22 DIAGNOSIS — G8929 Other chronic pain: Secondary | ICD-10-CM

## 2017-05-22 DIAGNOSIS — M6281 Muscle weakness (generalized): Secondary | ICD-10-CM

## 2017-05-22 DIAGNOSIS — R29898 Other symptoms and signs involving the musculoskeletal system: Secondary | ICD-10-CM

## 2017-05-22 NOTE — Therapy (Signed)
Dictation #1 [id number]  [facility] [place], [place], [address] Phone: [phone]   Fax:  [phone]  Physical Therapy Treatment  Patient Details  Name: [name] MRN: [id number] Date of Birth: 1933-07-03 Referring Provider: Dr. Suella Broad   Encounter Date: 05/22/2017  PT End of Session - 05/22/17 1317    Visit Number  10    Number of Visits  18    Date for PT Re-Evaluation  06/20/17    Authorization Type  HT advantage    PT Start Time  1315    PT Stop Time  1401    PT Time Calculation (min)  46 min    Activity Tolerance  Patient tolerated treatment well    Behavior During Therapy  [facility] for tasks assessed/performed       No past medical history on file.  Past Surgical History:  Procedure Laterality Date  . CHOLECYSTECTOMY    . CHOLECYSTECTOMY  08/09/2011   Procedure: LAPAROSCOPIC CHOLECYSTECTOMY;  Surgeon: Merrie Roof, MD;  Location: [facility];  Service: General;  Laterality: N/A;  . HERNIA REPAIR    . TUBAL LIGATION      There were no vitals filed for this visit.  Subjective Assessment - 05/22/17 1318    Subjective  Pt. reporting increased LBP today due to taking down thanksgiving decorations and putting up [employer].      Diagnostic tests  xray - hip: negative; lumbar - arthritis, anteriolisthesis    Patient Stated Goals  improve walking, pain    Currently in Pain?  Yes    Pain Score  4     Pain Location  Back    Pain Orientation  Lower    Pain Descriptors / Indicators  Aching    Pain Type  Acute pain    Pain Onset  More than a month ago    Pain Frequency  Intermittent    Aggravating Factors   walking     Pain Relieving Factors  Sitting     Multiple Pain Sites  No         [facility] PT Assessment - 05/22/17 1327      Observation/Other Assessments   Focus on Therapeutic Outcomes (FOTO)   FOTO: 40% (60% limitation)      AROM   AROM  Assessment Site  Lumbar    Lumbar Flexion  WFL, no pain    Lumbar Extension  25% limited, no pain    Lumbar - Right Side Bend  WFL, no pain    Lumbar - Left Side Bend  WFL, no pain    Lumbar - Right Rotation  WFL, no pain    Lumbar - Left Rotation  WFL, no pain      Strength   Strength Assessment Site  Hip;Knee    Right/Left Hip  Right;Left    Right Hip Flexion  4-/5    Right Hip ABduction  3-/5    Left Hip Flexion  4/5    Left Hip ABduction  3-/5    Right/Left Knee  Right;Left    Right Knee Flexion  4-/5    Right Knee Extension  4/5    Left Knee Flexion  4/5    Left Knee Extension  4/5      Flexibility   Hamstrings  B tightness; R ~65 dg, L ~  70 dg on 90/90 test  [facility] Adult PT Treatment/Exercise - 05/22/17 1339      Self-Care   Self-Care  Other Self-Care Comments    Other Self-Care Comments   Discussion regarding not using rolling chair for transport of items around house as pt. still admitting to using this for UE support and for transport       Lumbar Exercises: Aerobic   Stationary Bike  NuStep: lvl 5, 6 min       Lumbar Exercises: Seated   Other Seated Lumbar Exercises  B HS curl with red TB x 20 reps with adducion ball squeeze      Lumbar Exercises: Supine   Bridge  15 reps;3 seconds    Bridge Limitations  with sustained hip abd/ER isometric into red TB; constant cueing for increased ROM       Lumbar Exercises: Sidelying   Clam  15 reps    Clam Limitations  B; no resistance on R; yellow TB on L    Hip Abduction  10 reps    Hip Abduction Limitations  Required manual assist last 5 reps on L and full set on R       Knee/Hip Exercises: Standing   Hip Abduction  Right;Left;10 reps;Knee straight    Abduction Limitations  counter; pt. reporting fatigue  cues to "point toes toward counter"      Iontophoresis   Type of Iontophoresis  Dexamethasone    Location  R GT trochanter     Dose  1.26m, 816mmin     Time  4-6 hour wear time  4#/6                 PT Short Term Goals - 05/13/17 1647      PT SHORT TERM GOAL #1   Title  Patient to be independent with initial HEP    Status  Achieved        PT Long Term Goals - 05/22/17 1615      PT LONG TERM GOAL #1   Title  Patient to demonstrate/report ability to stand and/or ambulate for >/= 10 minutes with pain no greater than 2/10    Status  On-going Reports 2 min standing/walking with LBP still rising to 4/10      PT LONG TERM GOAL #2   Title  Patient to improve BLE strength to >/= 4/5 for improved functional mobility    Status  Partially Met      PT LONG TERM GOAL #3   Title  Patient to report initiation and maintenance of walking program for 3-5 days/wk    Status  On-going      PT LONG TERM GOAL #4   Title  Patient will improve B hamstring flexibility by 75 degrees at 90/90 test    Status  On-going            Plan - 05/22/17 1321    Clinical Impression Statement  SaSamaurieporting increased LBP today, which she attributes to changing out thanksgiving and Christmas decorations at home.  Pt. admitting to still using rolling chair in home for transport of items to and from rooms despite previous instruction not to do this.  Pt. instructed to wean from using rolling chair as this may be partially responsible for ongoing LE weakness as she uses this for support.  SaRaidynhowing some improvement in LE strength however still unable to perform full ROM hip abduction with MMT at 3-/5.  Pt. still demonstrating lateral instability and B hip drop with gait as  a result of ongoing hip/LE weakness.  Pt. does report she is able to use UE support less with ambulation in home.  Reports LBP limits pt. walking/standing tolerance to 2 min.  Pt. with increased tenderness at R GT today thus ended treatment with application of iontophoresis for hopeful improvement in walking tolerance and decreased tenderness.  Some improvement in B HS length with 90/90 test today with pt. reporting  HS stretch becoming "easier" at home.  Will continue to progress strengthening activities as able in coming visits.  Pt. making progress toward goals however will recommend continued PT at this time to maximize functional mobility.     PT Treatment/Interventions  ADLs/Self Care Home Management;Iontophoresis '4mg'$ /ml Dexamethasone;Cryotherapy;Electrical Stimulation;Moist Heat;Ultrasound;Neuromuscular re-education;Therapeutic exercise;Therapeutic activities;Functional mobility training;Gait training;Patient/family education;Manual techniques;Passive range of motion;Taping;Dry needling    Consulted and Agree with Plan of Care  Patient       Patient will benefit from skilled therapeutic intervention in order to improve the following deficits and impairments:  Decreased activity tolerance, Decreased mobility, Difficulty walking, Decreased strength, Pain  Visit Diagnosis: Chronic bilateral low back pain without sciatica - Plan: PT plan of care cert/re-cert  Pain in right hip - Plan: PT plan of care cert/re-cert  Muscle weakness (generalized) - Plan: PT plan of care cert/re-cert  Other symptoms and signs involving the musculoskeletal system - Plan: PT plan of care cert/re-cert   G-Codes - 77/11/65 1622    Functional Assessment Tool Used (Outpatient Only)  FOTO 40% (60% limited)    Functional Limitation  Mobility: Walking and moving around    Mobility: Walking and Moving Around Current Status (B9038)  At least 60 percent but less than 80 percent impaired, limited or restricted    Mobility: Walking and Moving Around Goal Status [phone])  At least 40 percent but less than 60 percent impaired, limited or restricted       Problem List Patient Active Problem List   Diagnosis Date Noted  . Cholelithiasis with acute or chronic cholecystitis 08/10/2011  . Obesity (BMI 30.0-34.9) 08/10/2011    Bess Harvest, PTA 05/23/17 9:57 AM  [facility] [address]  [place], [place], [address] Phone: [phone]   Fax:  [phone]  Name: MISHKA STEGEMANN MRN: [id number] Date of Birth: Jun 29, 1933

## 2017-05-27 ENCOUNTER — Ambulatory Visit: Payer: PPO | Attending: Physical Medicine and Rehabilitation | Admitting: Physical Therapy

## 2017-05-27 ENCOUNTER — Encounter: Payer: Self-pay | Admitting: Physical Therapy

## 2017-05-27 DIAGNOSIS — R29898 Other symptoms and signs involving the musculoskeletal system: Secondary | ICD-10-CM | POA: Diagnosis not present

## 2017-05-27 DIAGNOSIS — M25551 Pain in right hip: Secondary | ICD-10-CM | POA: Diagnosis not present

## 2017-05-27 DIAGNOSIS — M6281 Muscle weakness (generalized): Secondary | ICD-10-CM | POA: Insufficient documentation

## 2017-05-27 DIAGNOSIS — M545 Low back pain: Secondary | ICD-10-CM | POA: Diagnosis not present

## 2017-05-27 DIAGNOSIS — G8929 Other chronic pain: Secondary | ICD-10-CM | POA: Insufficient documentation

## 2017-05-27 NOTE — Therapy (Signed)
[facility] [address]  [place], [place], [address] Phone: [phone]   Fax:  [phone]  Physical Therapy Treatment  Patient Details  Name: [name] MRN: [id number] Date of Birth: [birth year] Referring Provider: Dr. Suella Broad   Encounter Date: 05/27/2017  PT End of Session - 05/27/17 1742    Visit Number  11    Number of Visits  18    Date for PT Re-Evaluation  06/20/17    Authorization Type  HT advantage    PT Start Time  1531    PT Stop Time  1616    PT Time Calculation (min)  45 min    Activity Tolerance  Patient tolerated treatment well    Behavior During Therapy  [facility] for tasks assessed/performed       History reviewed. No pertinent past medical history.  Past Surgical History:  Procedure Laterality Date  . CHOLECYSTECTOMY    . CHOLECYSTECTOMY  08/09/2011   Procedure: LAPAROSCOPIC CHOLECYSTECTOMY;  Surgeon: Merrie Roof, MD;  Location: [facility];  Service: General;  Laterality: N/A;  . HERNIA REPAIR    . TUBAL LIGATION      There were no vitals filed for this visit.  Subjective Assessment - 05/27/17 1530    Subjective  Patient ended up stuck on the floor yesterday when attempting to turn on gas logs in her home. Patient was unable to get up and had to have help from husband who was able to help her to a chair. Patient having significant muscle soreness throughout legs, but no true pain.     Diagnostic tests  xray - hip: negative; lumbar - arthritis, anteriolisthesis    Currently in Pain?  Yes    Pain Score  5     Pain Location  Leg    Pain Orientation  Right;Left;Proximal    Pain Descriptors / Indicators  Sore    Pain Type  Acute pain    Multiple Pain Sites  Yes    Pain Score  3    Pain Location  Back    Pain Orientation  Lower    Pain Descriptors / Indicators  Aching    Pain Type  Chronic pain                      [facility] Adult PT Treatment/Exercise - 05/27/17 1546       Lumbar Exercises: Aerobic   Stationary Bike  NuStep: lvl 5, 6 min       Lumbar Exercises: Standing   Other Standing Lumbar Exercises  forward/lateral step up; 6" step; B UE support; 10 reps each LE    Other Standing Lumbar Exercises  side stepping at counter; yelllow tband at ankles; 4 laps      Lumbar Exercises: Supine   Straight Leg Raise  10 reps;2 seconds    Straight Leg Raises Limitations  2 sets toes straight; 2 sets ER; R/L               PT Short Term Goals - 05/13/17 1647      PT SHORT TERM GOAL #1   Title  Patient to be independent with initial HEP    Status  Achieved        PT Long Term Goals - 05/22/17 1615      PT LONG TERM GOAL #1   Title  Patient to demonstrate/report ability to stand and/or ambulate for >/= 10 minutes  with pain no greater than 2/10    Status  On-going Reports 2 min standing/walking with LBP still rising to 4/10      PT LONG TERM GOAL #2   Title  Patient to improve BLE strength to >/= 4/5 for improved functional mobility    Status  Partially Met      PT LONG TERM GOAL #3   Title  Patient to report initiation and maintenance of walking program for 3-5 days/wk    Status  On-going      PT LONG TERM GOAL #4   Title  Patient will improve B hamstring flexibility by 75 degrees at 90/90 test    Status  On-going            Plan - 05/27/17 1743    Clinical Impression Statement  Patient having significant LE soreness today after being unable to get up from floor yesterday. Patient tolerated all exercises today, however continues to demonstrate significant strength and endurance limitations with all exercises at hips. Educated patient on options for floor transfer if she will need to be in position she was in yesterday. Continued to work on patient strengthening and will progress to patient tolerance.     PT Treatment/Interventions  ADLs/Self Care Home Management;Iontophoresis 59m/ml Dexamethasone;Cryotherapy;Electrical  Stimulation;Moist Heat;Ultrasound;Neuromuscular re-education;Therapeutic exercise;Therapeutic activities;Functional mobility training;Gait training;Patient/family education;Manual techniques;Passive range of motion;Taping;Dry needling    Consulted and Agree with Plan of Care  Patient       Patient will benefit from skilled therapeutic intervention in order to improve the following deficits and impairments:  Decreased activity tolerance, Decreased mobility, Difficulty walking, Decreased strength, Pain  Visit Diagnosis: Chronic bilateral low back pain without sciatica  Pain in right hip  Muscle weakness (generalized)  Other symptoms and signs involving the musculoskeletal system     Problem List Patient Active Problem List   Diagnosis Date Noted  . Cholelithiasis with acute or chronic cholecystitis 08/10/2011  . Obesity (BMI 30.0-34.9) 08/10/2011     KMickle Asper SPT 05/27/17 5:51 PM    [facility] [address] [place] [address]Phone: [phone]  Fax:  [phone] Name: Kelly ROLLMRN: [id number] of Birth: 102/25/1935

## 2017-05-29 ENCOUNTER — Ambulatory Visit: Payer: PPO

## 2017-05-29 DIAGNOSIS — G8929 Other chronic pain: Secondary | ICD-10-CM

## 2017-05-29 DIAGNOSIS — M545 Low back pain, unspecified: Secondary | ICD-10-CM

## 2017-05-29 DIAGNOSIS — R29898 Other symptoms and signs involving the musculoskeletal system: Secondary | ICD-10-CM

## 2017-05-29 DIAGNOSIS — M6281 Muscle weakness (generalized): Secondary | ICD-10-CM

## 2017-05-29 DIAGNOSIS — M25551 Pain in right hip: Secondary | ICD-10-CM

## 2017-05-29 NOTE — Therapy (Signed)
[facility] [address]  [place], [place], [address] Phone: [phone]   Fax:  [phone]  Physical Therapy Treatment  Patient Details  Name: [name] MRN: [id number] Date of Birth: 1933-11-10 Referring Provider: Dr. Suella Broad   Encounter Date: 05/29/2017  PT End of Session - 05/29/17 0934    Visit Number  12    Number of Visits  18    Date for PT Re-Evaluation  06/20/17    Authorization Type  HT advantage    PT Start Time  0930    PT Stop Time  1015    PT Time Calculation (min)  45 min    Activity Tolerance  Patient tolerated treatment well    Behavior During Therapy  [facility] for tasks assessed/performed       No past medical history on file.  Past Surgical History:  Procedure Laterality Date  . CHOLECYSTECTOMY    . CHOLECYSTECTOMY  08/09/2011   Procedure: LAPAROSCOPIC CHOLECYSTECTOMY;  Surgeon: Merrie Roof, MD;  Location: [facility];  Service: General;  Laterality: N/A;  . HERNIA REPAIR    . TUBAL LIGATION      There were no vitals filed for this visit.  Subjective Assessment - 05/29/17 0930    Subjective  Pt. reporting "all of me is feeling better since last visit".  Reports knees still feel bruised from getting up off floor with help of husband.  Reports "my back pain is improving a little".      Diagnostic tests  xray - hip: negative; lumbar - arthritis, anteriolisthesis    Patient Stated Goals  improve walking, pain    Currently in Pain?  Yes    Pain Score  2     Pain Location  Back    Pain Orientation  Lower    Pain Descriptors / Indicators  Aching;Dull    Pain Type  Acute pain    Pain Onset  More than a month ago    Pain Frequency  Constant    Aggravating Factors   walking    Multiple Pain Sites  Yes                      [facility] Adult PT Treatment/Exercise - 05/29/17 0953      Lumbar Exercises: Aerobic   Stationary Bike  Recumbent bike: lvl 1, 6 min       Lumbar  Exercises: Standing   Functional Squats  10 reps    Functional Squats Limitations  mini at counter     Forward Lunge  10 reps;3 seconds    Forward Lunge Limitations  Mini; counter + chair support       Lumbar Exercises: Seated   Sit to Stand  10 reps light pushoff from knees       Lumbar Exercises: Supine   Other Supine Lumbar Exercises  Hookying side step with red band at knees x 10 reps each side       Lumbar Exercises: Sidelying   Hip Abduction  10 reps AAROM     Hip Abduction Limitations  Required min assist from therapist  required less assistance       Knee/Hip Exercises: Standing   Lateral Step Up  Step Height: 6";Left;5 reps;Right    Lateral Step Up Limitations  heavy 2 HH assist; at mat table     Forward Step Up  Right;Left;10 reps;Step Height: 6"    Forward Step Up  Limitations  1-2 HH assist from therapist      Knee/Hip Exercises: Seated   Abduction/Adduction   Right;Left;15 reps    Abd/Adduction Limitations  red TB at knees                PT Short Term Goals - 05/13/17 1647      PT SHORT TERM GOAL #1   Title  Patient to be independent with initial HEP    Status  Achieved        PT Long Term Goals - 05/22/17 1615      PT LONG TERM GOAL #1   Title  Patient to demonstrate/report ability to stand and/or ambulate for >/= 10 minutes with pain no greater than 2/10    Status  On-going Reports 2 min standing/walking with LBP still rising to 4/10      PT LONG TERM GOAL #2   Title  Patient to improve BLE strength to >/= 4/5 for improved functional mobility    Status  Partially Met      PT LONG TERM GOAL #3   Title  Patient to report initiation and maintenance of walking program for 3-5 days/wk    Status  On-going      PT LONG TERM GOAL #4   Title  Patient will improve B hamstring flexibility by 75 degrees at 90/90 test    Status  On-going            Plan - 05/29/17 0935    Clinical Impression Statement  [name] reporting, "All of me feels better  than I did last visit".  Reports legs have recovered after requiring help from husband to get up off floor at home, as previously reported last visit.  Primary complaint today with therex was knee pain with stepping activities however, this pain quickly resolved following rest.  [name] reporting no back pain to end treatment thus E-stim deferred.  Gait pattern with visible improvement in stability today with pt. verbalizing improved ability to ambulate at home needing less assistance from UE support.  Chyane progressing well toward goals.  Will continue to benefit from further skilled therapy to maximize functional mobility and strength.      PT Treatment/Interventions  ADLs/Self Care Home Management;Iontophoresis '4mg'$ /ml Dexamethasone;Cryotherapy;Electrical Stimulation;Moist Heat;Ultrasound;Neuromuscular re-education;Therapeutic exercise;Therapeutic activities;Functional mobility training;Gait training;Patient/family education;Manual techniques;Passive range of motion;Taping;Dry needling    Consulted and Agree with Plan of Care  Patient       Patient will benefit from skilled therapeutic intervention in order to improve the following deficits and impairments:  Decreased activity tolerance, Decreased mobility, Difficulty walking, Decreased strength, Pain  Visit Diagnosis: Chronic bilateral low back pain without sciatica  Pain in right hip  Muscle weakness (generalized)  Other symptoms and signs involving the musculoskeletal system     Problem List Patient Active Problem List   Diagnosis Date Noted  . Cholelithiasis with acute or chronic cholecystitis 08/10/2011  . Obesity (BMI 30.0-34.9) 08/10/2011    [name], PTA 05/29/17 12:32 PM  [facility] [address]  [place], [place], [address] Phone: [phone]   Fax:  [phone]  Name: [name] MRN: [id number] Date of Birth: 19-Sep-1933

## 2017-06-03 ENCOUNTER — Ambulatory Visit: Payer: PPO

## 2017-06-05 ENCOUNTER — Ambulatory Visit: Payer: PPO | Admitting: Physical Therapy

## 2017-06-12 ENCOUNTER — Ambulatory Visit: Payer: PPO

## 2017-06-12 DIAGNOSIS — M545 Low back pain, unspecified: Secondary | ICD-10-CM

## 2017-06-12 DIAGNOSIS — R29898 Other symptoms and signs involving the musculoskeletal system: Secondary | ICD-10-CM

## 2017-06-12 DIAGNOSIS — M6281 Muscle weakness (generalized): Secondary | ICD-10-CM

## 2017-06-12 DIAGNOSIS — M25551 Pain in right hip: Secondary | ICD-10-CM

## 2017-06-12 DIAGNOSIS — G8929 Other chronic pain: Secondary | ICD-10-CM

## 2017-06-12 NOTE — Therapy (Signed)
[facility] [address]  [place], [place], [address] Phone: [phone]   Fax:  [phone]  Physical Therapy Treatment  Patient Details  Name: [name] MRN: [id number] Date of Birth: 1933-08-24 Referring Provider: Dr. Suella Broad   Encounter Date: 06/12/2017  PT End of Session - 06/12/17 0935    Visit Number  13    Number of Visits  18    Date for PT Re-Evaluation  06/20/17    Authorization Type  HT advantage    PT Start Time  0931    PT Stop Time  1016    PT Time Calculation (min)  45 min    Activity Tolerance  Patient tolerated treatment well    Behavior During Therapy  [facility] for tasks assessed/performed       No past medical history on file.  Past Surgical History:  Procedure Laterality Date  . CHOLECYSTECTOMY    . CHOLECYSTECTOMY  08/09/2011   Procedure: LAPAROSCOPIC CHOLECYSTECTOMY;  Surgeon: Merrie Roof, MD;  Location: [facility];  Service: General;  Laterality: N/A;  . HERNIA REPAIR    . TUBAL LIGATION      There were no vitals filed for this visit.  Subjective Assessment - 06/12/17 0934    Subjective  Pt. reporting she is "hardly using furniture and walls to walk around house now".  Reports improvement in LBP over last two weeks.  Pt. reporting her main concern is wanting to be able to get up and down from chairs and walk around home with greater ease.      How long can you stand comfortably?  30 min     How long can you walk comfortably?  30 min     Diagnostic tests  xray - hip: negative; lumbar - arthritis, anteriolisthesis    Patient Stated Goals  improve walking, pain    Currently in Pain?  Yes    Pain Score  1     Pain Location  Back    Pain Orientation  Lower    Pain Descriptors / Indicators  Aching;Dull    Pain Type  Acute pain    Pain Onset  More than a month ago    Pain Frequency  Constant    Multiple Pain Sites  Yes    Pain Score  3    Pain Location  Knee    Pain  Orientation  Right    Pain Descriptors / Indicators  Aching;Sharp    Pain Type  Chronic pain    Pain Onset  More than a month ago                      [facility] Adult PT Treatment/Exercise - 06/12/17 0959      Lumbar Exercises: Aerobic   Stationary Bike  Recumbent bike: lvl 1, 6 min       Lumbar Exercises: Standing   Functional Squats  10 reps    Functional Squats Limitations  mini at chair       Lumbar Exercises: Seated   Long Arc Quad on Chair  Right;Left;15 reps    LAQ on Chair Limitations  adduction ball 2#  3" hold     Sit to Stand  15 reps      Knee/Hip Exercises: Standing   Hip Abduction  Right;Left;10 reps;Knee straight    Abduction Limitations  1#; chair     Hip Extension  Right;Left;10 reps;Knee straight  Extension Limitations  1#; chair     Forward Step Up  Right;Left;10 reps;Step Height: 6"    Forward Step Up Limitations  1-2 HH assist from therapist    Step Down  Step Height: 4";Right;Left;10 reps;Hand Hold: 2    Step Down Limitations  limited control with eccentric       Knee/Hip Exercises: Seated   Hamstring Curl  Right;Left;15 reps    Hamstring Limitations  red TB               PT Short Term Goals - 05/13/17 1647      PT SHORT TERM GOAL #1   Title  Patient to be independent with initial HEP    Status  Achieved        PT Long Term Goals - 06/12/17 1003      PT LONG TERM GOAL #1   Title  Patient to demonstrate/report ability to stand and/or ambulate for >/= 10 minutes with pain no greater than 2/10    Status  Achieved 15 min ambulating before needing to sit due to back pain 4/10       PT LONG TERM GOAL #2   Title  Patient to improve BLE strength to >/= 4/5 for improved functional mobility    Status  Partially Met      PT LONG TERM GOAL #3   Title  Patient to report initiation and maintenance of walking program for 3-5 days/wk    Status  On-going      PT LONG TERM GOAL #4   Title  Patient will improve B hamstring  flexibility by 75 degrees at 90/90 test    Status  On-going            Plan - 06/12/17 1002    Clinical Impression Statement  Pt. reporting she is using furniture and walls less for ambulation around home and feels like her LBP has decreased some over last two weeks.  Pt. reporting she wants to "be able to get up easier and walk around home without having to use furniture or walls at all".  Reports being able to walk 10-15 min before needing break due to back pain now.  [name] tolerated all strengthening activities in treatment well with R knee pain being pt.'s main limiting factor with stepping activities.  [name] made aware she is approaching end of current POC and [name] verbalizing that she wishes to continue with therapy to continue working on above mentioned deficits.      PT Treatment/Interventions  ADLs/Self Care Home Management;Iontophoresis '4mg'$ /ml Dexamethasone;Cryotherapy;Electrical Stimulation;Moist Heat;Ultrasound;Neuromuscular re-education;Therapeutic exercise;Therapeutic activities;Functional mobility training;Gait training;Patient/family education;Manual techniques;Passive range of motion;Taping;Dry needling    Consulted and Agree with Plan of Care  Patient       Patient will benefit from skilled therapeutic intervention in order to improve the following deficits and impairments:  Decreased activity tolerance, Decreased mobility, Difficulty walking, Decreased strength, Pain  Visit Diagnosis: Chronic bilateral low back pain without sciatica  Pain in right hip  Muscle weakness (generalized)  Other symptoms and signs involving the musculoskeletal system     Problem List Patient Active Problem List   Diagnosis Date Noted  . Cholelithiasis with acute or chronic cholecystitis 08/10/2011  . Obesity (BMI 30.0-34.9) 08/10/2011    Bess Harvest, PTA 06/12/17 6:20 PM  [facility] [address]  [place], [place], [address] Phone: [phone]   Fax:  [phone]  Name: [name] MRN: [id number] Date  of Birth: Mar 14, 1934

## 2017-06-26 ENCOUNTER — Ambulatory Visit: Payer: PPO | Attending: Physical Medicine and Rehabilitation

## 2017-06-26 DIAGNOSIS — R29898 Other symptoms and signs involving the musculoskeletal system: Secondary | ICD-10-CM | POA: Diagnosis not present

## 2017-06-26 DIAGNOSIS — M6281 Muscle weakness (generalized): Secondary | ICD-10-CM | POA: Diagnosis not present

## 2017-06-26 DIAGNOSIS — G8929 Other chronic pain: Secondary | ICD-10-CM | POA: Insufficient documentation

## 2017-06-26 DIAGNOSIS — M545 Low back pain: Secondary | ICD-10-CM | POA: Diagnosis not present

## 2017-06-26 DIAGNOSIS — M25551 Pain in right hip: Secondary | ICD-10-CM | POA: Insufficient documentation

## 2017-06-26 NOTE — Therapy (Signed)
[facility] [address]  [place], [place], [address] Phone: [phone]   Fax:  [phone]  Physical Therapy Treatment  Patient Details  Name: [name] MRN: [id number] Date of Birth: September 01, 1933 Referring Provider: Dr. Suella Broad   Encounter Date: 06/26/2017  PT End of Session - 06/26/17 1445    Visit Number  14    Number of Visits  22    Date for PT Re-Evaluation  07/25/17    Authorization Type  HT advantage    PT Start Time  1446    PT Stop Time  1533    PT Time Calculation (min)  47 min    Activity Tolerance  Patient tolerated treatment well    Behavior During Therapy  [facility] for tasks assessed/performed       No past medical history on file.  Past Surgical History:  Procedure Laterality Date  . CHOLECYSTECTOMY    . CHOLECYSTECTOMY  08/09/2011   Procedure: LAPAROSCOPIC CHOLECYSTECTOMY;  Surgeon: Merrie Roof, MD;  Location: [facility];  Service: General;  Laterality: N/A;  . HERNIA REPAIR    . TUBAL LIGATION      There were no vitals filed for this visit.  Subjective Assessment - 06/26/17 1445    Subjective  Pt. reporting she plans to use silver sneakers program at [facility] in future after therapy.      Limitations  Standing;Walking    How long can you sit comfortably?  no issue    How long can you stand comfortably?  40    How long can you walk comfortably?  30    Diagnostic tests  xray - hip: negative; lumbar - arthritis, anteriolisthesis    Patient Stated Goals  improve walking, pain    Currently in Pain?  Yes    Pain Score  2     Pain Location  Back    Pain Orientation  Lower    Pain Descriptors / Indicators  Aching;Dull    Pain Type  Acute pain    Pain Onset  More than a month ago    Pain Frequency  Constant    Aggravating Factors   walking    Pain Relieving Factors  sitting, rest     Multiple Pain Sites  Yes    Pain Score  3    Pain Location  Knee    Pain Orientation  Right    Pain  Descriptors / Indicators  Aching;Sharp    Pain Type  Chronic pain    Pain Onset  More than a month ago    Pain Frequency  Intermittent         [facility] PT Assessment - 06/26/17 1455      Strength   Strength Assessment Site  Hip;Knee    Right/Left Hip  Right;Left    Right Hip Flexion  4-/5    Right Hip ABduction  3-/5    Left Hip Flexion  4/5    Left Hip ABduction  3-/5    Right/Left Knee  Right;Left    Right Knee Flexion  4-/5    Right Knee Extension  4/5    Left Knee Flexion  4/5    Left Knee Extension  4+/5      Flexibility   Hamstrings  B tightness; R ~73 dg, L ~  72 dg on 90/90 test  [facility] Adult PT Treatment/Exercise - 06/26/17 1502      Lumbar Exercises: Aerobic   Stationary Bike  NuStep: lvl 7, 7 min       Lumbar Exercises: Machines for Strengthening   Cybex Knee Flexion  B LE's 15# x 10 reps       Lumbar Exercises: Seated   Long Arc Quad on Chair  Right;Left;15 reps    LAQ on Chair Limitations  adduction ball 2#       Lumbar Exercises: Supine   Bridge  15 reps;3 seconds      Knee/Hip Exercises: Standing   Hip Flexion  Right;Left;10 reps;Knee straight    Hip Flexion Limitations  yellow looped TB at ankles; counter     Hip Abduction  Right;Left;10 reps;Knee straight    Abduction Limitations  yellow looped TB at ankles; counter     Hip Extension  Right;Left;10 reps;Knee straight    Extension Limitations  yellow looped TB at ankles; counter     Forward Step Up  Right;Left;10 reps;Step Height: 6"    Forward Step Up Limitations  1-2 HH assist from therapist    Step Down  Right;Left;10 reps;Hand Hold: 2;Step Height: 6"    Step Down Limitations  1-2 HH assist from therapist and counter       Knee/Hip Exercises: Seated   Clamshell with TheraBand  Red 3" x 15 reps     Abduction/Adduction   Right;Left;15 reps    Abd/Adduction Limitations  red TB at knees              PT Education - 06/26/17 1542    Education provided  Yes     Education Details  Seated B hip abd/ER with yellow TB at knees, Seated abduction march     Person(s) Educated  Patient    Methods  Explanation;Demonstration;Verbal cues;Handout    Comprehension  Verbalized understanding;Returned demonstration;Verbal cues required;Need further instruction       PT Short Term Goals - 05/13/17 1647      PT SHORT TERM GOAL #1   Title  Patient to be independent with initial HEP    Status  Achieved        PT Long Term Goals - 06/26/17 1546      PT LONG TERM GOAL #1   Title  Patient to demonstrate/report ability to stand and/or ambulate for >/= 10 minutes with pain no greater than 2/10    Status  Achieved 40 min ambulating before needing to sit due to back pain        PT LONG TERM GOAL #2   Title  Patient to improve BLE strength to >/= 4/5 for improved functional mobility    Status  Partially Met      PT LONG TERM GOAL #3   Title  Patient to report initiation and maintenance of walking program for 3-5 days/wk    Status  On-going      PT LONG TERM GOAL #4   Title  Patient will improve B hamstring flexibility by 75 degrees at 90/90 test    Status  On-going            Plan - 06/26/17 1445    Clinical Impression Statement  Pt. seen to start treatment reporting therapy has helped her ambulate at home without need for as much UE support.  Reports LBP is improving with walking; now ambulating ~ 40 min while grocery shopping without need for sitting rest break.  Dalaya wishes to continue with therapy  to focus on greater stability walking in home with less LBP.  Pt. continues ambulating with visible lateral instability likely due to ongoing hip abduction weakness at 3-/5 strength however was able to demo marginal LE strength improvement with MMT.  HEP review revealed pt. has not been performing standing hip abduction activity correctly.  Pt. technique corrected with pt. verbalizing increased difficulty with new technique however able to complete without  issue.  HEP further updated with additional hip abduction strengthening activities.  Pt. has yet to initiate home walking program despite frequent encouragement from therapist.  Suggest recert for additional skilled therapy to maximize functional strength and balance for decreased LBP and improved mobility in home and community.  PT to assess balance at next visit as patient reports some fear of instability.     PT Frequency  2x / week    PT Duration  4 weeks    PT Treatment/Interventions  ADLs/Self Care Home Management;Iontophoresis 34m/ml Dexamethasone;Cryotherapy;Electrical Stimulation;Moist Heat;Ultrasound;Neuromuscular re-education;Therapeutic exercise;Therapeutic activities;Functional mobility training;Gait training;Patient/family education;Manual techniques;Passive range of motion;Taping;Dry needling    Consulted and Agree with Plan of Care  Patient       Patient will benefit from skilled therapeutic intervention in order to improve the following deficits and impairments:  Decreased activity tolerance, Decreased mobility, Difficulty walking, Decreased strength, Pain  Visit Diagnosis: Chronic bilateral low back pain without sciatica - Plan: PT plan of care cert/re-cert  Pain in right hip  Muscle weakness (generalized)  Other symptoms and signs involving the musculoskeletal system     Problem List Patient Active Problem List   Diagnosis Date Noted  . Cholelithiasis with acute or chronic cholecystitis 08/10/2011  . Obesity (BMI 30.0-34.9) 08/10/2011    MBess Harvest PTA 06/27/17 8:30 AM  [facility] [place] [address]Phone: [phone]  Fax:  [phone] Name: Kelly TOROMRN: [id number] of Birth: 1October 27, 1935

## 2017-07-01 ENCOUNTER — Encounter: Payer: Self-pay | Admitting: Physical Therapy

## 2017-07-01 ENCOUNTER — Ambulatory Visit: Payer: PPO | Admitting: Physical Therapy

## 2017-07-01 DIAGNOSIS — M6281 Muscle weakness (generalized): Secondary | ICD-10-CM

## 2017-07-01 DIAGNOSIS — G8929 Other chronic pain: Secondary | ICD-10-CM

## 2017-07-01 DIAGNOSIS — M545 Low back pain: Secondary | ICD-10-CM | POA: Diagnosis not present

## 2017-07-01 DIAGNOSIS — R29898 Other symptoms and signs involving the musculoskeletal system: Secondary | ICD-10-CM

## 2017-07-01 DIAGNOSIS — M25551 Pain in right hip: Secondary | ICD-10-CM

## 2017-07-01 NOTE — Therapy (Signed)
[facility] [address]  [place], [place], [address] Phone: [phone]   Fax:  [phone]  Physical Therapy Treatment  Patient Details  Name: [name] MRN: [id number] Date of Birth: 05-26-1934 Referring Provider: Dr. Suella Broad   Encounter Date: 07/01/2017  [name] End of Session - 07/01/17 0932    Visit Number  15    Number of Visits  22    Date for [name] Re-Evaluation  07/25/17    Authorization Type  HT advantage    [name] Start Time  0930    [name] Stop Time  1011    [name] Time Calculation (min)  41 min    Activity Tolerance  Patient tolerated treatment well    Behavior During Therapy  [facility] for tasks assessed/performed       History reviewed. No pertinent past medical history.  Past Surgical History:  Procedure Laterality Date  . CHOLECYSTECTOMY    . CHOLECYSTECTOMY  08/09/2011   Procedure: LAPAROSCOPIC CHOLECYSTECTOMY;  Surgeon: Merrie Roof, MD;  Location: [facility];  Service: General;  Laterality: N/A;  . HERNIA REPAIR    . TUBAL LIGATION      There were no vitals filed for this visit.  Subjective Assessment - 07/01/17 0930    Subjective  doing well today - no new complaints; notes good improvements in back pain since starting [name]    Diagnostic tests  xray - hip: negative; lumbar - arthritis, anteriolisthesis    Patient Stated Goals  improve walking, pain    Currently in Pain?  Yes    Pain Score  1     Pain Location  Back    Pain Orientation  Lower                      [facility] Adult [name] Treatment/Exercise - 07/01/17 0001      Lumbar Exercises: Stretches   Passive Hamstring Stretch  3 reps;30 seconds    Passive Hamstring Stretch Limitations  seated - LE extended with forward flexion at waist      Lumbar Exercises: Aerobic   Stationary Bike  NuStep: lvl 5, 6 min       Lumbar Exercises: Standing   Heel Raises  15 reps    Functional Squats  15 reps    Functional Squats Limitations  heavy  VC for form    Other Standing Lumbar Exercises  fwd/bwd monster walking - red tband x 30 feet each - B UE support from [name]      Lumbar Exercises: Seated   Other Seated Lumbar Exercises  B HS curl - red tband x 15      Knee/Hip Exercises: Standing   Hip Abduction  Right;Left;10 reps;Knee straight    Abduction Limitations  red tband - much shallower movement    Hip Extension  Right;Left;10 reps;Knee straight    Extension Limitations  red tband               [name] Short Term Goals - 05/13/17 1647      [name] SHORT TERM GOAL #1   Title  Patient to be independent with initial HEP    Status  Achieved        [name] Long Term Goals - 06/26/17 1546      [name] LONG TERM GOAL #1   Title  Patient to demonstrate/report ability to stand and/or ambulate for >/= 10 minutes with pain no greater than 2/10  Status  Achieved 40 min ambulating before needing to sit due to back pain        [name] LONG TERM GOAL #2   Title  Patient to improve BLE strength to >/= 4/5 for improved functional mobility    Status  Partially Met      [name] LONG TERM GOAL #3   Title  Patient to report initiation and maintenance of walking program for 3-5 days/wk    Status  On-going      [name] LONG TERM GOAL #4   Title  Patient will improve B hamstring flexibility by 75 degrees at 90/90 test    Status  On-going            Plan - 07/01/17 0932    Clinical Impression Statement  Malachy Moan today reporting good improvements in back pain since starting [name]. Questions today regarding walking program - how to initiate and how long to walk for to achieve desired cardiovascular effects. Strengthening continued today to reinforce hip strength needed for good lumbopelvic stability. Patient stating next visit will likely conclude patients POC with therapist to review comprehensive HEP at that time and reasses goals and FOTO.     [name] Treatment/Interventions  ADLs/Self Care Home Management;Iontophoresis '4mg'$ /ml Dexamethasone;Cryotherapy;Electrical  Stimulation;Moist Heat;Ultrasound;Neuromuscular re-education;Therapeutic exercise;Therapeutic activities;Functional mobility training;Gait training;Patient/family education;Manual techniques;Passive range of motion;Taping;Dry needling    Consulted and Agree with Plan of Care  Patient       Patient will benefit from skilled therapeutic intervention in order to improve the following deficits and impairments:  Decreased activity tolerance, Decreased mobility, Difficulty walking, Decreased strength, Pain  Visit Diagnosis: Chronic bilateral low back pain without sciatica  Pain in right hip  Muscle weakness (generalized)  Other symptoms and signs involving the musculoskeletal system     Problem List Patient Active Problem List   Diagnosis Date Noted  . Cholelithiasis with acute or chronic cholecystitis 08/10/2011  . Obesity (BMI 30.0-34.9) 08/10/2011     [name], [name], [name] 07/01/17 12:13 PM   [facility] [address]  [place], [place], [address] Phone: [phone]   Fax:  [phone]  Name: [name] MRN: [id number] Date of Birth: 1933-10-10

## 2017-07-03 ENCOUNTER — Ambulatory Visit: Payer: PPO

## 2017-07-03 DIAGNOSIS — M545 Low back pain: Secondary | ICD-10-CM | POA: Diagnosis not present

## 2017-07-03 DIAGNOSIS — G8929 Other chronic pain: Secondary | ICD-10-CM

## 2017-07-03 DIAGNOSIS — M25551 Pain in right hip: Secondary | ICD-10-CM

## 2017-07-03 DIAGNOSIS — M6281 Muscle weakness (generalized): Secondary | ICD-10-CM

## 2017-07-03 DIAGNOSIS — R29898 Other symptoms and signs involving the musculoskeletal system: Secondary | ICD-10-CM

## 2017-07-03 NOTE — Therapy (Addendum)
[facility] [address]  [place], [place], [address] Phone: [phone]   Fax:  [phone]  Physical Therapy Treatment  Patient Details  Name: [name] MRN: [id number] Date of Birth: Oct 31, 1933 Referring Provider: Dr. Suella Broad   Encounter Date: 07/03/2017  PT End of Session - 07/03/17 1022    Visit Number  16    Number of Visits  22    Date for PT Re-Evaluation  07/25/17    Authorization Type  HT advantage    PT Start Time  1016    PT Stop Time  1057    PT Time Calculation (min)  41 min    Activity Tolerance  Patient tolerated treatment well    Behavior During Therapy  [facility] for tasks assessed/performed       No past medical history on file.  Past Surgical History:  Procedure Laterality Date  . CHOLECYSTECTOMY    . CHOLECYSTECTOMY  08/09/2011   Procedure: LAPAROSCOPIC CHOLECYSTECTOMY;  Surgeon: Merrie Roof, MD;  Location: [facility];  Service: General;  Laterality: N/A;  . HERNIA REPAIR    . TUBAL LIGATION      There were no vitals filed for this visit.  Subjective Assessment - 07/03/17 1331    Subjective  Pt. reporting she wishes to make today last day with therapy and go on 30-day hold.    Limitations  Standing;Walking    Diagnostic tests  xray - hip: negative; lumbar - arthritis, anteriolisthesis    Patient Stated Goals  improve walking, pain    Currently in Pain?  Yes    Pain Score  1     Pain Location  Back    Pain Orientation  Lower    Pain Descriptors / Indicators  Aching    Pain Type  Acute pain    Pain Onset  More than a month ago    Pain Frequency  Constant    Aggravating Factors   walking     Pain Relieving Factors  sitting, rest     Multiple Pain Sites  No         [facility] PT Assessment - 07/03/17 1027      Observation/Other Assessments   Focus on Therapeutic Outcomes (FOTO)   57% (43% limitation)      Strength   Strength Assessment Site  Hip;Knee    Right/Left Hip   Right;Left    Right Hip Flexion  4-/5    Right Hip ABduction  3-/5    Left Hip Flexion  4/5    Left Hip ABduction  3-/5    Right/Left Knee  Right;Left    Right Knee Flexion  4-/5    Right Knee Extension  4/5 slight pain on max effort     Left Knee Flexion  4+/5    Left Knee Extension  4+/5      Flexibility   Hamstrings  B tightness; R ~76 dg, L ~ 77 dg on 90/90 test                   [facility] Adult PT Treatment/Exercise - 07/03/17 1100      Self-Care   Self-Care  Other Self-Care Comments    Other Self-Care Comments   Review of comprehensive HEP to check for appropriatness and upgrade TB resistance to continue to challange pt.       Lumbar Exercises: Stretches   Passive Hamstring Stretch  1 rep;20 seconds  Passive Hamstring Stretch Limitations  supine       Lumbar Exercises: Aerobic   Stationary Bike  NuStep: lvl 5, 6 min       Lumbar Exercises: Seated   Long Arc Quad on Chair  Right;Left;15 reps    LAQ on Chair Limitations  red TB at ankles       Lumbar Exercises: Supine   Bent Knee Raise  3 seconds;15 reps    Bent Knee Raise Limitations  red TB at knees + ab set; alternating    Bridge  15 reps;3 seconds improved hip clearance       Knee/Hip Exercises: Standing   Knee Flexion  Right;Left;10 reps      Knee/Hip Exercises: Seated   Clamshell with TheraBand  Red    Abduction/Adduction   Right;Left;15 reps    Abd/Adduction Limitations  red TB at knees                PT Short Term Goals - 05/13/17 1647      PT SHORT TERM GOAL #1   Title  Patient to be independent with initial HEP    Status  Achieved        PT Long Term Goals - 07/03/17 1027      PT LONG TERM GOAL #1   Title  Patient to demonstrate/report ability to stand and/or ambulate for >/= 10 minutes with pain no greater than 2/10    Status  Achieved 40 min ambulating before needing to sit due to back pain        PT LONG TERM GOAL #2   Title  Patient to improve BLE strength to >/= 4/5  for improved functional mobility    Status  Partially Met      PT LONG TERM GOAL #3   Title  Patient to report initiation and maintenance of walking program for 3-5 days/wk    Status  Not Met Pt. has not yet attempted however verbalizing plan to initiation soon      PT LONG TERM GOAL #4   Title  Patient will improve B hamstring flexibility by 75 degrees at 90/90 test    Status  Achieved            Plan - 07/03/17 1100    Clinical Impression Statement  [name] seen to start treatment reporting she feels comfortable transitioning to HEP/walking program and plans to initiate walking program at home soon.  Pt. partially meeting strength goal and able to achieve HS flexibility goal today.  Reports good reduction in LBP since starting therapy with daily activities and household tasks.  Reports she no longer needs to use furniture/walls for support while ambulating at home.  Still with LE strength deficits with greatest weakness in B hip abductors however has consistently admitted to limited compliance of HEP throughout POC likely contributing to limited strength improvement.  Discussed possibility of 30-day hold with pt. and supervising PT with PT and pt. agreeing to this.  Comprehensive HEP reviewed with pt. with band resistance updated and activities checked for appropriateness.  Pt. verbalizing understanding on ongoing HEP and now on 30-day hold from therapy.      PT Treatment/Interventions  ADLs/Self Care Home Management;Iontophoresis 28m/ml Dexamethasone;Cryotherapy;Electrical Stimulation;Moist Heat;Ultrasound;Neuromuscular re-education;Therapeutic exercise;Therapeutic activities;Functional mobility training;Gait training;Patient/family education;Manual techniques;Passive range of motion;Taping;Dry needling    Consulted and Agree with Plan of Care  Patient       Patient will benefit from skilled therapeutic intervention in order to improve the following deficits and  impairments:  Decreased  activity tolerance, Decreased mobility, Difficulty walking, Decreased strength, Pain  Visit Diagnosis: Chronic bilateral low back pain without sciatica  Pain in right hip  Muscle weakness (generalized)  Other symptoms and signs involving the musculoskeletal system     Problem List Patient Active Problem List   Diagnosis Date Noted  . Cholelithiasis with acute or chronic cholecystitis 08/10/2011  . Obesity (BMI 30.0-34.9) 08/10/2011    Bess Harvest, PTA 07/03/17 1:51 PM  [facility] [address]  [place], [place], [address] Phone: [phone]   Fax:  [phone]  Name: [name] MRN: [id number] Date of Birth: 1934-03-07   PHYSICAL THERAPY DISCHARGE SUMMARY  Visits from Start of Care: 16  Current functional level related to goals / functional outcomes:   Refer to above clinical impression for status as of last visit on 07/03/17. Pt was placed on hold for 30 days and has not needed to return to PT, therefore will proceed with discharge from PT for this episode.   Remaining deficits:   As above.   Education / Equipment:   HEP  Plan: Patient agrees to discharge.  Patient goals were partially met. Patient is being discharged due to being pleased with the current functional level.  ?????    Percival Spanish, PT, MPT 08/05/17, 4:34 PM  [facility] [address]  [place], [place], [address] Phone: [phone]   Fax:  [phone]

## 2018-03-12 DIAGNOSIS — Z1231 Encounter for screening mammogram for malignant neoplasm of breast: Secondary | ICD-10-CM | POA: Diagnosis not present

## 2018-03-12 DIAGNOSIS — R7303 Prediabetes: Secondary | ICD-10-CM | POA: Diagnosis not present

## 2018-03-12 DIAGNOSIS — R159 Full incontinence of feces: Secondary | ICD-10-CM | POA: Diagnosis not present

## 2018-03-12 DIAGNOSIS — E559 Vitamin D deficiency, unspecified: Secondary | ICD-10-CM | POA: Diagnosis not present

## 2018-03-12 DIAGNOSIS — Z0000 Encounter for general adult medical examination without abnormal findings: Secondary | ICD-10-CM | POA: Diagnosis not present

## 2018-03-12 DIAGNOSIS — N904 Leukoplakia of vulva: Secondary | ICD-10-CM | POA: Diagnosis not present

## 2018-03-12 DIAGNOSIS — N3941 Urge incontinence: Secondary | ICD-10-CM | POA: Diagnosis not present

## 2018-03-12 DIAGNOSIS — R030 Elevated blood-pressure reading, without diagnosis of hypertension: Secondary | ICD-10-CM | POA: Diagnosis not present

## 2018-03-12 DIAGNOSIS — R152 Fecal urgency: Secondary | ICD-10-CM | POA: Diagnosis not present

## 2018-04-04 ENCOUNTER — Encounter (HOSPITAL_BASED_OUTPATIENT_CLINIC_OR_DEPARTMENT_OTHER): Payer: Self-pay

## 2018-04-04 ENCOUNTER — Other Ambulatory Visit: Payer: Self-pay

## 2018-04-04 ENCOUNTER — Encounter (HOSPITAL_COMMUNITY)
Admission: EM | Disposition: A | Discharge status: Home / Self Care | Payer: Self-pay | Source: Home / Self Care | Attending: Interventional Cardiology

## 2018-04-04 ENCOUNTER — Emergency Department (HOSPITAL_BASED_OUTPATIENT_CLINIC_OR_DEPARTMENT_OTHER): Payer: PPO

## 2018-04-04 ENCOUNTER — Inpatient Hospital Stay (HOSPITAL_BASED_OUTPATIENT_CLINIC_OR_DEPARTMENT_OTHER)
Admission: EM | Admit: 2018-04-04 | Discharge: 2018-04-08 | DRG: 246 | Disposition: A | Discharge status: Home / Self Care | Payer: PPO | Attending: Interventional Cardiology | Admitting: Interventional Cardiology

## 2018-04-04 DIAGNOSIS — Z9851 Tubal ligation status: Secondary | ICD-10-CM

## 2018-04-04 DIAGNOSIS — E785 Hyperlipidemia, unspecified: Secondary | ICD-10-CM

## 2018-04-04 DIAGNOSIS — I358 Other nonrheumatic aortic valve disorders: Secondary | ICD-10-CM | POA: Diagnosis present

## 2018-04-04 DIAGNOSIS — Z9049 Acquired absence of other specified parts of digestive tract: Secondary | ICD-10-CM

## 2018-04-04 DIAGNOSIS — I5021 Acute systolic (congestive) heart failure: Secondary | ICD-10-CM | POA: Diagnosis not present

## 2018-04-04 DIAGNOSIS — I440 Atrioventricular block, first degree: Secondary | ICD-10-CM | POA: Diagnosis not present

## 2018-04-04 DIAGNOSIS — I2510 Atherosclerotic heart disease of native coronary artery without angina pectoris: Secondary | ICD-10-CM | POA: Diagnosis present

## 2018-04-04 DIAGNOSIS — I480 Paroxysmal atrial fibrillation: Secondary | ICD-10-CM

## 2018-04-04 DIAGNOSIS — I213 ST elevation (STEMI) myocardial infarction of unspecified site: Secondary | ICD-10-CM | POA: Diagnosis not present

## 2018-04-04 DIAGNOSIS — Z6836 Body mass index (BMI) 36.0-36.9, adult: Secondary | ICD-10-CM | POA: Diagnosis not present

## 2018-04-04 DIAGNOSIS — E669 Obesity, unspecified: Secondary | ICD-10-CM | POA: Diagnosis present

## 2018-04-04 DIAGNOSIS — I2102 ST elevation (STEMI) myocardial infarction involving left anterior descending coronary artery: Secondary | ICD-10-CM | POA: Diagnosis not present

## 2018-04-04 DIAGNOSIS — I2109 ST elevation (STEMI) myocardial infarction involving other coronary artery of anterior wall: Secondary | ICD-10-CM | POA: Diagnosis present

## 2018-04-04 DIAGNOSIS — I351 Nonrheumatic aortic (valve) insufficiency: Secondary | ICD-10-CM | POA: Diagnosis not present

## 2018-04-04 DIAGNOSIS — R52 Pain, unspecified: Secondary | ICD-10-CM | POA: Diagnosis present

## 2018-04-04 DIAGNOSIS — R079 Chest pain, unspecified: Secondary | ICD-10-CM

## 2018-04-04 DIAGNOSIS — I255 Ischemic cardiomyopathy: Secondary | ICD-10-CM | POA: Diagnosis not present

## 2018-04-04 HISTORY — PX: LEFT HEART CATH AND CORONARY ANGIOGRAPHY: CATH118249

## 2018-04-04 HISTORY — DX: ST elevation (STEMI) myocardial infarction of unspecified site: I21.3

## 2018-04-04 HISTORY — DX: Acute systolic (congestive) heart failure: I50.21

## 2018-04-04 HISTORY — DX: Paroxysmal atrial fibrillation: I48.0

## 2018-04-04 HISTORY — PX: CORONARY/GRAFT ACUTE MI REVASCULARIZATION: CATH118305

## 2018-04-04 HISTORY — DX: Hyperlipidemia, unspecified: E78.5

## 2018-04-04 LAB — POCT I-STAT 3, ART BLOOD GAS (G3+)
Acid-base deficit: 3 mmol/L — ABNORMAL HIGH (ref 0.0–2.0)
BICARBONATE: 22.8 mmol/L (ref 20.0–28.0)
O2 SAT: 100 %
PO2 ART: 184 mmHg — AB (ref 83.0–108.0)
TCO2: 24 mmol/L (ref 22–32)
pCO2 arterial: 41.3 mmHg (ref 32.0–48.0)
pH, Arterial: 7.349 — ABNORMAL LOW (ref 7.350–7.450)

## 2018-04-04 LAB — COMPREHENSIVE METABOLIC PANEL
ALK PHOS: 61 U/L (ref 38–126)
ALT: 12 U/L (ref 0–44)
ANION GAP: 14 (ref 5–15)
AST: 19 U/L (ref 15–41)
Albumin: 4 g/dL (ref 3.5–5.0)
BUN: 27 mg/dL — ABNORMAL HIGH (ref 8–23)
CALCIUM: 9 mg/dL (ref 8.9–10.3)
CHLORIDE: 103 mmol/L (ref 98–111)
CO2: 22 mmol/L (ref 22–32)
Creatinine, Ser: 0.83 mg/dL (ref 0.44–1.00)
GFR calc non Af Amer: >60 mL/min (ref >60)
GLUCOSE: 164 mg/dL — AB (ref 70–99)
Potassium: 4.1 mmol/L (ref 3.5–5.1)
Sodium: 139 mmol/L (ref 135–145)
Total Bilirubin: 0.7 mg/dL (ref 0.3–1.2)
Total Protein: 7.1 g/dL (ref 6.5–8.1)

## 2018-04-04 LAB — CBC
HCT: 43.4 % (ref 36.0–46.0)
HEMOGLOBIN: 13.8 g/dL (ref 12.0–15.0)
MCH: 32.7 pg (ref 26.0–34.0)
MCHC: 31.8 g/dL (ref 30.0–36.0)
MCV: 102.8 fL — AB (ref 80.0–100.0)
Platelets: 237 10*3/uL (ref 150–400)
RBC: 4.22 MIL/uL (ref 3.87–5.11)
RDW: 13.6 % (ref 11.5–15.5)
WBC: 12.3 10*3/uL — ABNORMAL HIGH (ref 4.0–10.5)
nRBC: 0 % (ref 0.0–0.2)

## 2018-04-04 LAB — MRSA PCR SCREENING: MRSA by PCR: NEGATIVE

## 2018-04-04 LAB — LIPID PANEL
CHOL/HDL RATIO: 4.6 ratio
Cholesterol: 230 mg/dL — ABNORMAL HIGH (ref 0–200)
HDL: 50 mg/dL (ref >40)
LDL CALC: 126 mg/dL — AB (ref 0–99)
TRIGLYCERIDES: 271 mg/dL — AB (ref <150)
VLDL: 54 mg/dL — AB (ref 0–40)

## 2018-04-04 LAB — POCT ACTIVATED CLOTTING TIME
Activated Clotting Time: 241 seconds
Activated Clotting Time: 307 seconds
Activated Clotting Time: 356 seconds

## 2018-04-04 LAB — PROTIME-INR
INR: 1.04
PROTHROMBIN TIME: 13.5 s (ref 11.4–15.2)

## 2018-04-04 LAB — TROPONIN I
TROPONIN I: 0.06 ng/mL — AB (ref <0.03)
Troponin I: >65 ng/mL (ref <0.03)

## 2018-04-04 LAB — APTT: APTT: 33 s (ref 24–36)

## 2018-04-04 SURGERY — LEFT HEART CATH AND CORONARY ANGIOGRAPHY
Anesthesia: LOCAL

## 2018-04-04 MED ORDER — LIDOCAINE HCL (PF) 1 % IJ SOLN
INTRAMUSCULAR | Status: DC | PRN
  Administered 2018-04-04: 2 mL

## 2018-04-04 MED ORDER — METOPROLOL TARTRATE 12.5 MG HALF TABLET
12.5000 mg | ORAL_TABLET | Freq: Two times a day (BID) | ORAL | Status: DC
  Administered 2018-04-04 – 2018-04-08 (×8): 12.5 mg via ORAL
  Filled 2018-04-04 (×8): qty 1

## 2018-04-04 MED ORDER — ACETAMINOPHEN 325 MG PO TABS: 650.0000 mg | ORAL_TABLET | ORAL | Status: DC | PRN

## 2018-04-04 MED ORDER — NITROGLYCERIN 1 MG/10 ML FOR IR/CATH LAB
INTRA_ARTERIAL | Status: AC
  Filled 2018-04-04: qty 10

## 2018-04-04 MED ORDER — SODIUM CHLORIDE 0.9% FLUSH: 3.0000 mL | INTRAVENOUS | Status: DC | PRN

## 2018-04-04 MED ORDER — ONDANSETRON HCL 4 MG/2ML IJ SOLN
INTRAMUSCULAR | Status: AC
  Filled 2018-04-04: qty 2

## 2018-04-04 MED ORDER — ONDANSETRON HCL 4 MG/2ML IJ SOLN: 4.0000 mg | Freq: Four times a day (QID) | INTRAMUSCULAR | Status: DC | PRN

## 2018-04-04 MED ORDER — VERAPAMIL HCL 2.5 MG/ML IV SOLN
INTRAVENOUS | Status: DC | PRN
  Administered 2018-04-04: 10 mL via INTRA_ARTERIAL

## 2018-04-04 MED ORDER — HYDROCODONE-ACETAMINOPHEN 5-325 MG PO TABS: 2.0000 | ORAL_TABLET | ORAL | Status: DC | PRN

## 2018-04-04 MED ORDER — SODIUM CHLORIDE 0.9 % IV SOLN
4.0000 ug/kg/min | INTRAVENOUS | Status: AC
  Filled 2018-04-04: qty 50

## 2018-04-04 MED ORDER — FUROSEMIDE 10 MG/ML IJ SOLN
INTRAMUSCULAR | Status: DC | PRN
  Administered 2018-04-04: 40 mg via INTRAVENOUS

## 2018-04-04 MED ORDER — NITROGLYCERIN IN D5W 200-5 MCG/ML-% IV SOLN
INTRAVENOUS | Status: AC | PRN
  Administered 2018-04-04: 20 ug/min via INTRAVENOUS

## 2018-04-04 MED ORDER — FUROSEMIDE 10 MG/ML IJ SOLN
INTRAMUSCULAR | Status: AC
  Filled 2018-04-04: qty 4

## 2018-04-04 MED ORDER — SODIUM CHLORIDE 0.9 % IV SOLN: 250.0000 mL | INTRAVENOUS | Status: DC | PRN

## 2018-04-04 MED ORDER — ASPIRIN 81 MG PO CHEW
324.0000 mg | CHEWABLE_TABLET | Freq: Once | ORAL | Status: AC
  Administered 2018-04-04: 324 mg via ORAL

## 2018-04-04 MED ORDER — LIDOCAINE HCL (PF) 1 % IJ SOLN
INTRAMUSCULAR | Status: AC
  Filled 2018-04-04: qty 30

## 2018-04-04 MED ORDER — TICAGRELOR 90 MG PO TABS
90.0000 mg | ORAL_TABLET | Freq: Two times a day (BID) | ORAL | Status: DC
  Administered 2018-04-04 – 2018-04-08 (×8): 90 mg via ORAL
  Filled 2018-04-04 (×8): qty 1

## 2018-04-04 MED ORDER — MIDAZOLAM HCL 2 MG/2ML IJ SOLN
INTRAMUSCULAR | Status: AC
  Filled 2018-04-04: qty 2

## 2018-04-04 MED ORDER — ASPIRIN 81 MG PO CHEW
81.0000 mg | CHEWABLE_TABLET | Freq: Every day | ORAL | Status: DC
  Administered 2018-04-05 – 2018-04-08 (×4): 81 mg via ORAL
  Filled 2018-04-04 (×4): qty 1

## 2018-04-04 MED ORDER — FUROSEMIDE 10 MG/ML IJ SOLN
40.0000 mg | Freq: Two times a day (BID) | INTRAMUSCULAR | Status: AC
  Administered 2018-04-04 – 2018-04-05 (×3): 40 mg via INTRAVENOUS
  Filled 2018-04-04 (×3): qty 4

## 2018-04-04 MED ORDER — ACETAMINOPHEN 325 MG PO TABS
650.0000 mg | ORAL_TABLET | ORAL | Status: DC | PRN
  Administered 2018-04-05 – 2018-04-08 (×3): 650 mg via ORAL
  Filled 2018-04-04 (×3): qty 2

## 2018-04-04 MED ORDER — CANGRELOR BOLUS VIA INFUSION
INTRAVENOUS | Status: DC | PRN
  Administered 2018-04-04: 2859 ug via INTRAVENOUS

## 2018-04-04 MED ORDER — IOHEXOL 350 MG/ML SOLN
INTRAVENOUS | Status: DC | PRN
  Administered 2018-04-04: 155 mL via INTRA_ARTERIAL

## 2018-04-04 MED ORDER — CANGRELOR TETRASODIUM 50 MG IV SOLR
INTRAVENOUS | Status: AC
  Filled 2018-04-04: qty 50

## 2018-04-04 MED ORDER — LABETALOL HCL 5 MG/ML IV SOLN: 10.0000 mg | INTRAVENOUS | Status: AC | PRN

## 2018-04-04 MED ORDER — NITROGLYCERIN IN D5W 200-5 MCG/ML-% IV SOLN: 0.0000 ug/min | INTRAVENOUS | Status: DC

## 2018-04-04 MED ORDER — HEPARIN SODIUM (PORCINE) 5000 UNIT/ML IJ SOLN
4000.0000 [IU] | Freq: Once | INTRAMUSCULAR | Status: AC
  Administered 2018-04-04: 4000 [IU] via INTRAVENOUS

## 2018-04-04 MED ORDER — HEPARIN SODIUM (PORCINE) 5000 UNIT/ML IJ SOLN
INTRAMUSCULAR | Status: AC
  Administered 2018-04-04: 4000 [IU] via INTRAVENOUS
  Filled 2018-04-04: qty 1

## 2018-04-04 MED ORDER — ASPIRIN 81 MG PO CHEW
CHEWABLE_TABLET | ORAL | Status: AC
  Administered 2018-04-04: 324 mg via ORAL
  Filled 2018-04-04: qty 4

## 2018-04-04 MED ORDER — NITROGLYCERIN IN D5W 200-5 MCG/ML-% IV SOLN
INTRAVENOUS | Status: AC
  Filled 2018-04-04: qty 250

## 2018-04-04 MED ORDER — MIDAZOLAM HCL 2 MG/2ML IJ SOLN
INTRAMUSCULAR | Status: DC | PRN
  Administered 2018-04-04: 1 mg via INTRAVENOUS

## 2018-04-04 MED ORDER — SODIUM CHLORIDE 0.9 % IV SOLN
INTRAVENOUS | Status: DC
  Administered 2018-04-04: 20 mL via INTRAVENOUS

## 2018-04-04 MED ORDER — FENTANYL CITRATE (PF) 100 MCG/2ML IJ SOLN
INTRAMUSCULAR | Status: DC | PRN
  Administered 2018-04-04: 25 ug via INTRAVENOUS

## 2018-04-04 MED ORDER — TICAGRELOR 90 MG PO TABS
ORAL_TABLET | ORAL | Status: DC | PRN
  Administered 2018-04-04: 180 mg via ORAL

## 2018-04-04 MED ORDER — SODIUM CHLORIDE 0.9% FLUSH
3.0000 mL | Freq: Two times a day (BID) | INTRAVENOUS | Status: DC
  Administered 2018-04-04 – 2018-04-08 (×7): 3 mL via INTRAVENOUS

## 2018-04-04 MED ORDER — VERAPAMIL HCL 2.5 MG/ML IV SOLN
INTRAVENOUS | Status: AC
  Filled 2018-04-04: qty 2

## 2018-04-04 MED ORDER — HYDRALAZINE HCL 20 MG/ML IJ SOLN: 5.0000 mg | INTRAMUSCULAR | Status: AC | PRN

## 2018-04-04 MED ORDER — NITROGLYCERIN 0.4 MG SL SUBL
SUBLINGUAL_TABLET | SUBLINGUAL | Status: AC
  Administered 2018-04-04: 0.4 mg via SUBLINGUAL
  Filled 2018-04-04: qty 2

## 2018-04-04 MED ORDER — FENTANYL CITRATE (PF) 100 MCG/2ML IJ SOLN
INTRAMUSCULAR | Status: AC
  Filled 2018-04-04: qty 2

## 2018-04-04 MED ORDER — HEPARIN SODIUM (PORCINE) 1000 UNIT/ML IJ SOLN
INTRAMUSCULAR | Status: AC
  Filled 2018-04-04: qty 1

## 2018-04-04 MED ORDER — ATORVASTATIN CALCIUM 80 MG PO TABS
80.0000 mg | ORAL_TABLET | Freq: Every day | ORAL | Status: DC
  Administered 2018-04-04 – 2018-04-07 (×4): 80 mg via ORAL
  Filled 2018-04-04 (×4): qty 1

## 2018-04-04 MED ORDER — HEPARIN (PORCINE) IN NACL 1000-0.9 UT/500ML-% IV SOLN
INTRAVENOUS | Status: AC
  Filled 2018-04-04: qty 1000

## 2018-04-04 MED ORDER — SODIUM CHLORIDE 0.9 % IV SOLN
INTRAVENOUS | Status: AC | PRN
  Administered 2018-04-04: 4 ug/kg/min via INTRAVENOUS

## 2018-04-04 MED ORDER — HEPARIN SODIUM (PORCINE) 1000 UNIT/ML IJ SOLN
INTRAMUSCULAR | Status: DC | PRN
  Administered 2018-04-04: 4000 [IU] via INTRAVENOUS
  Administered 2018-04-04: 6000 [IU] via INTRAVENOUS

## 2018-04-04 MED ORDER — HEPARIN (PORCINE) IN NACL 1000-0.9 UT/500ML-% IV SOLN
INTRAVENOUS | Status: DC | PRN
  Administered 2018-04-04 (×2): 500 mL

## 2018-04-04 MED ORDER — ONDANSETRON HCL 4 MG/2ML IJ SOLN
4.0000 mg | Freq: Once | INTRAMUSCULAR | Status: AC
  Administered 2018-04-04: 4 mg via INTRAVENOUS

## 2018-04-04 MED ORDER — NITROGLYCERIN 0.4 MG SL SUBL
0.4000 mg | SUBLINGUAL_TABLET | SUBLINGUAL | Status: DC | PRN
  Administered 2018-04-04 (×3): 0.4 mg via SUBLINGUAL

## 2018-04-04 SURGICAL SUPPLY — 22 items
BALLN SAPPHIRE 2.5X12 (BALLOONS) ×2
BALLN SAPPHIRE ~~LOC~~ 3.5X15 (BALLOONS) ×2 IMPLANT
BALLOON SAPPHIRE 2.5X12 (BALLOONS) ×1 IMPLANT
CATH INFINITI JR4 5F (CATHETERS) ×2 IMPLANT
CATH LAUNCHER 6FR EBU 3 (CATHETERS) ×2 IMPLANT
CATH LAUNCHER 6FR EBU3.5 (CATHETERS) ×2 IMPLANT
DEVICE RAD COMP TR BAND LRG (VASCULAR PRODUCTS) ×4 IMPLANT
GLIDESHEATH SLEND SS 6F .021 (SHEATH) ×2 IMPLANT
GUIDEWIRE INQWIRE 1.5J.035X260 (WIRE) ×1 IMPLANT
INQWIRE 1.5J .035X260CM (WIRE) ×2
KIT ENCORE 26 ADVANTAGE (KITS) ×2 IMPLANT
KIT HEART LEFT (KITS) ×2 IMPLANT
KIT HEMO VALVE WATCHDOG (MISCELLANEOUS) ×2 IMPLANT
PACK CARDIAC CATHETERIZATION (CUSTOM PROCEDURE TRAY) ×2 IMPLANT
SHEATH PROBE COVER 6X72 (BAG) ×2 IMPLANT
STENT SYNERGY DES 3X38 (Permanent Stent) ×2 IMPLANT
TRANSDUCER W/STOPCOCK (MISCELLANEOUS) ×4 IMPLANT
TUBING CIL FLEX 10 FLL-RA (TUBING) ×2 IMPLANT
WIRE ASAHI PROWATER 180CM (WIRE) ×4 IMPLANT
WIRE FIGHTER CROSSING 190CM (WIRE) ×2 IMPLANT
WIRE HI TORQ BMW 190CM (WIRE) ×2 IMPLANT
WIRE HI TORQ VERSACORE-J 145CM (WIRE) ×2 IMPLANT

## 2018-04-04 NOTE — ED Provider Notes (Signed)
[facility] EMERGENCY DEPARTMENT Provider Note   CSN: [id number] Arrival date & time: 04/04/18  1227     History   Chief Complaint Chief Complaint  Patient presents with  . Chest Pain    HPI [name] is a 84 y.o. female.  Patient is a an 84 year old female with past medical history of prior cholecystectomy presenting with complaints of chest pain.  This started approximately 1 hour prior to presentation while she was shopping.  She describes the discomfort as a "blood pressure cuff around her chest" that is associated with shortness of breath and nausea.  She denies any prior cardiac history.  The history is provided by the patient.  Chest Pain   This is a new problem. The current episode started less than 1 hour ago. The problem occurs constantly. The problem has been rapidly worsening. The pain is present in the substernal region. The pain is moderate. The quality of the pain is described as pressure-like. Associated symptoms include diaphoresis, nausea and shortness of breath. Pertinent negatives include no fever.    History reviewed. No pertinent past medical history.  Patient Active Problem List   Diagnosis Date Noted  . Cholelithiasis with acute or chronic cholecystitis 08/10/2011  . Obesity (BMI 30.0-34.9) 08/10/2011    Past Surgical History:  Procedure Laterality Date  . CHOLECYSTECTOMY    . CHOLECYSTECTOMY  08/09/2011   Procedure: LAPAROSCOPIC CHOLECYSTECTOMY;  Surgeon: Robyne Askew, MD;  Location: [facility];  Service: General;  Laterality: N/A;  . HERNIA REPAIR    . TUBAL LIGATION       OB History   None      Home Medications    Prior to Admission medications   Medication Sig Start Date End Date Taking? Authorizing Provider  calcium carbonate (OS-CAL) 600 MG TABS Take 600 mg by mouth daily.    [provider]  Cholecalciferol (VITAMIN D-3) 5000 UNITS TABS Take 1 tablet by mouth daily.    [provider]  Garlic 1000 MG  CAPS Take 1 capsule by mouth daily.    [provider]  Ginkgo Biloba 40 MG TABS Take 1 tablet by mouth daily.    [provider]  HYDROcodone-acetaminophen (NORCO/VICODIN) 5-325 MG per tablet Take 2 tablets by mouth every 4 (four) hours as needed. 11/07/13   Elson Areas, PA-C  Lutein 20 MG CAPS Take 1 capsule by mouth daily.    [provider]  Multiple Vitamin (MULITIVITAMIN WITH MINERALS) TABS Take 1 tablet by mouth daily.    [provider]  Oil of Oregano 1500 MG CAPS Take 1 capsule by mouth daily.    [provider]  Spirulina 500 MG TABS Take 1 tablet by mouth daily.    [provider]  Turmeric 450 MG CAPS Take 1 capsule by mouth daily.    [provider]    Family History No family history on file.  Social History Social History   Tobacco Use  . Smoking status: Never Smoker  . Smokeless tobacco: Never Used  Substance Use Topics  . Alcohol use: No  . Drug use: No     Allergies   Patient has no known allergies.   Review of Systems Review of Systems  Constitutional: Positive for diaphoresis. Negative for fever.  Respiratory: Positive for shortness of breath.   Cardiovascular: Positive for chest pain.  Gastrointestinal: Positive for nausea.  All other systems reviewed and are negative.    Physical Exam Updated  Vital Signs BP 137/86   Pulse 87   Temp 98.1 F (36.7 C) (Oral)   Resp 16   Ht 5\' 4"  (1.626 m)   Wt 95.3 kg   SpO2 96%   BMI 36.05 kg/m   Physical Exam  Constitutional: She is oriented to person, place, and time. She appears well-developed and well-nourished. No distress.  HENT:  Head: Normocephalic and atraumatic.  Neck: Normal range of motion. Neck supple.  Cardiovascular: Normal rate and regular rhythm. Exam reveals no gallop and no friction rub.  No murmur heard. Pulmonary/Chest: Effort normal and breath sounds normal. No respiratory distress. She has no wheezes.  Abdominal:  Soft. Bowel sounds are normal. She exhibits no distension. There is no tenderness.  Musculoskeletal: Normal range of motion.  Neurological: She is alert and oriented to person, place, and time.  Skin: Skin is warm and dry. She is not diaphoretic.  Nursing note and vitals reviewed.    ED Treatments / Results  Labs (all labs ordered are listed, but only abnormal results are displayed) Labs Reviewed  CBC  TROPONIN I  PROTIME-INR  APTT  COMPREHENSIVE METABOLIC PANEL  LIPID PANEL    EKG EKG Interpretation  Date/Time:  Friday April 04 2018 12:39:47 EDT Ventricular Rate:  86 PR Interval:    QRS Duration: 99 QT Interval:  380 QTC Calculation: 455 R Axis:   -66 Text Interpretation:  Sinus rhythm Prolonged PR interval Probable left atrial enlargement Left anterior fascicular block Left ventricular hypertrophy Anterior infarct, acute (LAD) ST elevation, consider inferior injury >>> Acute MI <<< Confirmed by Geoffery Lyons ([id number]) on 04/04/2018 12:46:09 PM   Radiology No results found.  Procedures Procedures (including critical care time)  Medications Ordered in ED Medications  0.9 %  sodium chloride infusion (has no administration in time range)  nitroGLYCERIN (NITROSTAT) SL tablet 0.4 mg (0.4 mg Sublingual Given 04/04/18 1255)  aspirin chewable tablet 324 mg (324 mg Oral Given 04/04/18 1250)  heparin injection 4,000 Units (4,000 Units Intravenous Given 04/04/18 1251)     Initial Impression / Assessment and Plan / ED Course  I have reviewed the triage vital signs and the nursing notes.  Pertinent labs & imaging results that were available during my care of the patient were reviewed by me and considered in my medical decision making (see chart for details).  Patient's EKG is consistent with an acute anterior wall MI she will be transferred to [facility] for intervention.  I have discussed the care with Dr. Eldridge Dace who agrees to accept in transfer.  Aspirin and heparin  administered.  CRITICAL CARE Performed by: Geoffery Lyons Total critical care time: 35 minutes Critical care time was exclusive of separately billable procedures and treating other patients. Critical care was necessary to treat or prevent imminent or life-threatening deterioration. Critical care was time spent personally by me on the following activities: development of treatment plan with patient and/or surrogate as well as nursing, discussions with consultants, evaluation of patient's response to treatment, examination of patient, obtaining history from patient or surrogate, ordering and performing treatments and interventions, ordering and review of laboratory studies, ordering and review of radiographic studies, pulse oximetry and re-evaluation of patient's condition.   Final Clinical Impressions(s) / ED Diagnoses   Final diagnoses:  None    ED Discharge Orders    None       Geoffery Lyons, MD 04/04/18 1302

## 2018-04-04 NOTE — ED Notes (Signed)
Pt actively vomiting after third dose of NTG.  Orders for zofran and NS hung kvo.

## 2018-04-04 NOTE — ED Notes (Signed)
[facility] staff at bedside, report given pt care transferred. Pt denies any relief of chest squeezing, cont 9/10.

## 2018-04-04 NOTE — Progress Notes (Signed)
Upon arrival to unit a significant hematoma was noted on the R hand. [name] pressure applied, hematoma improved. Two TR bands present. Hand warm and well perfused. Capillary refill < 3 seconds. Will continue to monitor closely.

## 2018-04-04 NOTE — H&P (Signed)
Cardiology Admission History and Physical:   Patient ID: SEPHIRA ZELLMAN MRN: [id number]; DOB: 09-Aug-1933   Admission date: 04/04/2018  Primary Care Provider: Pearson Forster, MD Primary Cardiologist: No primary care provider on file. Eldridge Dace -new Primary Electrophysiologist:  None  Chief Complaint:  Chest pain  Patient Profile:   [name] is a 84 y.o. female with chest pain.  History of Present Illness:   Ms. [place]ac[place] has no prior cardiac history.  She began having chest discomfort about an hour before she presented to med [facility]  It felt like a tightness around her chest and began while she was shopping.  She has shortness of breath along with nausea and vomiting.  Initial ECG showed acute anterior ST elevation MI.  She has transferred to [facility] for emergency cardiac catheterization.  Her pain currently is a 10 out of 10.   History reviewed. No pertinent past medical history.  Past Surgical History:  Procedure Laterality Date  . CHOLECYSTECTOMY    . CHOLECYSTECTOMY  08/09/2011   Procedure: LAPAROSCOPIC CHOLECYSTECTOMY;  Surgeon: Robyne Askew, MD;  Location: [facility];  Service: General;  Laterality: N/A;  . HERNIA REPAIR    . TUBAL LIGATION       Medications Prior to Admission: Prior to Admission medications   Medication Sig Start Date End Date Taking? Authorizing Provider  calcium carbonate (OS-CAL) 600 MG TABS Take 600 mg by mouth daily.    [provider]  Cholecalciferol (VITAMIN D-3) 5000 UNITS TABS Take 1 tablet by mouth daily.    [provider]  Garlic 1000 MG CAPS Take 1 capsule by mouth daily.    [provider]  Ginkgo Biloba 40 MG TABS Take 1 tablet by mouth daily.    [provider]  HYDROcodone-acetaminophen (NORCO/VICODIN) 5-325 MG per tablet Take 2 tablets by mouth every 4 (four) hours as needed. 11/07/13   Elson Areas, PA-C  Lutein 20 MG CAPS Take 1 capsule by mouth daily.    [provider]   Multiple Vitamin (MULITIVITAMIN WITH MINERALS) TABS Take 1 tablet by mouth daily.    [provider]  Oil of Oregano 1500 MG CAPS Take 1 capsule by mouth daily.    [provider]  Spirulina 500 MG TABS Take 1 tablet by mouth daily.    [provider]  Turmeric 450 MG CAPS Take 1 capsule by mouth daily.    [provider]     Allergies:   No Known Allergies  Social History:   Social History   Socioeconomic History  . Marital status: Married    Spouse name: Not on file  . Number of children: Not on file  . Years of education: Not on file  . Highest education level: Not on file  Occupational History  . Not on file  Social Needs  . Financial resource strain: Not on file  . Food insecurity:    Worry: Not on file    Inability: Not on file  . Transportation needs:    Medical: Not on file    Non-medical: Not on file  Tobacco Use  . Smoking status: Never Smoker  . Smokeless tobacco: Never Used  Substance and Sexual Activity  . Alcohol use: No  . Drug use: No  . Sexual activity: Not on file  Lifestyle  . Physical activity:    Days per week: Not on file    Minutes per session: Not on file  .  Stress: Not on file  Relationships  . Social connections:    Talks on phone: Not on file    Gets together: Not on file    Attends religious service: Not on file    Active member of club or organization: Not on file    Attends meetings of clubs or organizations: Not on file    Relationship status: Not on file  . Intimate partner violence:    Fear of current or ex partner: Not on file    Emotionally abused: Not on file    Physically abused: Not on file    Forced sexual activity: Not on file  Other Topics Concern  . Not on file  Social History Narrative  . Not on file    Family History:   The patient's family history is not on file.  Did not obtain due to needing to expedite emergency procedure.  ROS:  Please see the history of present illness.   Chest pain.  Unable to obtain further due to emergent procedure.  All other ROS reviewed and negative.     Physical Exam/Data:   Vitals:   04/04/18 1510 04/04/18 1515 04/04/18 1520 04/04/18 1525  BP: (!) 148/90 (!) 146/96 135/83 (!) 142/91  Pulse: 97 95 92 95  Resp: (!) 22 15 10  (!) 23  Temp:      TempSrc:      SpO2: 97% 92% 90% 90%  Weight:      Height:       No intake or output data in the 24 hours ending 04/04/18 1607 Filed Weights   04/04/18 1233  Weight: 95.3 kg   Body mass index is 36.05 kg/m.  General:  Well nourished, well developed, in  acute distress HEENT: normal Lymph: no adenopathy Neck: no JVD Endocrine:  No thryomegaly Vascular: No carotid bruits; right FA pulses 2+, 2+ right radial pulse Cardiac:  normal S1, S2; RRR; no murmur  Lungs:  clear to auscultation bilaterally, no wheezing, rhonchi or rales  Abd: soft, nontender, no hepatomegaly  Ext: no edema Musculoskeletal:  No deformities, BUE and BLE strength normal and equal Skin: warm and dry  Neuro:  CNs 2-12 intact, no focal abnormalities noted Psych:  Normal affect    EKG:  The ECG that was done today was personally reviewed and demonstrates normal sinus rhythm with acute anterior ST elevation MI  Relevant CV Studies: ECG as above  Laboratory Data:  Chemistry Recent Labs  Lab 04/04/18 1248  NA 139  K 4.1  CL 103  CO2 22  GLUCOSE 164*  BUN 27*  CREATININE 0.83  CALCIUM 9.0  GFRNONAA >60  GFRAA >60  ANIONGAP 14    Recent Labs  Lab 04/04/18 1248  PROT 7.1  ALBUMIN 4.0  AST 19  ALT 12  ALKPHOS 61  BILITOT 0.7   Hematology Recent Labs  Lab 04/04/18 1248  WBC 12.3*  RBC 4.22  HGB 13.8  HCT 43.4  MCV 102.8*  MCH 32.7  MCHC 31.8  RDW 13.6  PLT 237   Cardiac Enzymes Recent Labs  Lab 04/04/18 1248  TROPONINI 0.06*   No results for input(s): TROPIPOC in the last 168 hours.  BNPNo results for input(s): BNP, PROBNP in the last 168 hours.  DDimer No results for input(s):  DDIMER in the last 168 hours.  Radiology/Studies:  No results found.  Assessment and Plan:   1. Acute anterior ST segment elevation MI.:  Emergent cardiac catheter revealed occluded ostial LAD.  She  had did have multivessel disease including the mid LAD and mid RCA.  Her proximal LAD was stented.  TIMI-3 flow was restored.  The patient's pain resolved.  Given the complex multivessel disease, the thought was to treat her with dual antiplatelet therapy for 30 days, let her heart recover and potentially look at bypass surgery to treat her multivessel disease.  Her right coronary artery is not favorable for percutaneous intervention. 2. We will start high-dose atorvastatin.  Start beta-blocker as well. 3. LVEDP was elevated.  Dose of IV Lasix given.  Will continue IV Lasix tomorrow as well.  Start IV nitroglycerin also to help reduce her blood pressure and help with volume overload symptoms.  Severity of Illness: The appropriate patient status for this patient is INPATIENT. Inpatient status is judged to be reasonable and necessary in order to provide the required intensity of service to ensure the patient's safety. The patient's presenting symptoms, physical exam findings, and initial radiographic and laboratory data in the context of their chronic comorbidities is felt to place them at high risk for further clinical deterioration. Furthermore, it is not anticipated that the patient will be medically stable for discharge from the hospital within 2 midnights of admission. The following factors support the patient status of inpatient.   " The patient's presenting symptoms include chest pain. " The worrisome physical exam findings include oxygen requirement. " The initial radiographic and laboratory data are worrisome because of abnormal ECG. " The chronic co-morbidities include age.   * I certify that at the point of admission it is my clinical judgment that the patient will require inpatient hospital  care spanning beyond 2 midnights from the point of admission due to high intensity of service, high risk for further deterioration and high frequency of surveillance required.*    For questions or updates, please contact [facility] Please consult [email or web address] for contact info under        Signed, Lance Muss, MD  04/04/2018 4:07 PM

## 2018-04-04 NOTE — ED Triage Notes (Signed)
C/o CP x 30 min-to triage in w/c-pale-skin w/d-states it was wet earlier

## 2018-04-04 NOTE — Progress Notes (Signed)
Critical lab value Trop greater than 65. This is an expected lab value for this patient post STEMI with stent placement. Patient is currently pain free and vital signs are stable. Will continue to monitor and notify provider of any changes.

## 2018-04-05 ENCOUNTER — Inpatient Hospital Stay (HOSPITAL_COMMUNITY): Payer: PPO

## 2018-04-05 DIAGNOSIS — E785 Hyperlipidemia, unspecified: Secondary | ICD-10-CM

## 2018-04-05 DIAGNOSIS — I351 Nonrheumatic aortic (valve) insufficiency: Secondary | ICD-10-CM

## 2018-04-05 LAB — BASIC METABOLIC PANEL
ANION GAP: 13 (ref 5–15)
BUN: 29 mg/dL — ABNORMAL HIGH (ref 8–23)
CO2: 22 mmol/L (ref 22–32)
Calcium: 8.9 mg/dL (ref 8.9–10.3)
Chloride: 103 mmol/L (ref 98–111)
Creatinine, Ser: 0.97 mg/dL (ref 0.44–1.00)
GFR calc Af Amer: >60 mL/min (ref >60)
GFR, EST NON AFRICAN AMERICAN: 53 mL/min — AB (ref >60)
GLUCOSE: 124 mg/dL — AB (ref 70–99)
POTASSIUM: 4.9 mmol/L (ref 3.5–5.1)
SODIUM: 138 mmol/L (ref 135–145)

## 2018-04-05 LAB — CBC
HEMATOCRIT: 36.1 % (ref 36.0–46.0)
HEMOGLOBIN: 11.6 g/dL — AB (ref 12.0–15.0)
MCH: 32.1 pg (ref 26.0–34.0)
MCHC: 32.1 g/dL (ref 30.0–36.0)
MCV: 100 fL (ref 80.0–100.0)
PLATELETS: 224 10*3/uL (ref 150–400)
RBC: 3.61 MIL/uL — AB (ref 3.87–5.11)
RDW: 13.7 % (ref 11.5–15.5)
WBC: 12.9 10*3/uL — AB (ref 4.0–10.5)
nRBC: 0 % (ref 0.0–0.2)

## 2018-04-05 LAB — HEMOGLOBIN A1C
Hgb A1c MFr Bld: 5.8 % — ABNORMAL HIGH (ref 4.8–5.6)
Mean Plasma Glucose: 119.76 mg/dL

## 2018-04-05 LAB — ECHOCARDIOGRAM COMPLETE
Height: 64 in
Weight: 3360 oz

## 2018-04-05 LAB — TROPONIN I: Troponin I: >65 ng/mL (ref <0.03)

## 2018-04-05 MED ORDER — ISOSORBIDE MONONITRATE ER 30 MG PO TB24
30.0000 mg | ORAL_TABLET | Freq: Every day | ORAL | Status: DC
  Administered 2018-04-05 – 2018-04-08 (×3): 30 mg via ORAL
  Filled 2018-04-05 (×4): qty 1

## 2018-04-05 MED ORDER — PERFLUTREN LIPID MICROSPHERE
1.0000 mL | INTRAVENOUS | Status: AC | PRN
  Administered 2018-04-05: 2 mL via INTRAVENOUS
  Filled 2018-04-05: qty 10

## 2018-04-05 NOTE — Plan of Care (Signed)
  Problem: Clinical Measurements: Goal: Ability to maintain clinical measurements within normal limits will improve Outcome: Progressing Goal: Diagnostic test results will improve Outcome: Progressing Goal: Respiratory complications will improve Outcome: Progressing   

## 2018-04-05 NOTE — Progress Notes (Signed)
  Echocardiogram 2D Echocardiogram has been performed.  Tye Savoy 04/05/2018, 9:52 AM

## 2018-04-05 NOTE — Progress Notes (Signed)
[id number] Started MI/stent education with patient and patient's family including restrictions, risk factor modification, Brilinta use, CP, NTG use, and calling 911. MI book and heart healthy diet handout given. Pt verbalizes understanding of information given. Discussed phase 2 cardiac rehab, and pt is interested in the cardiac rehab program at [facility], will send referral. Artist Pais, MS, ACSM CEP

## 2018-04-05 NOTE — Progress Notes (Signed)
Ok to give metoprolol 12.5mg  for PR interval of 0.24 per Dr. Tresa Endo.

## 2018-04-05 NOTE — Progress Notes (Signed)
Progress Note  Patient Name: [name] Date of Encounter: 04/05/2018  Primary Cardiologist: new, Dr. Eldridge Dace  Subjective   No recurrent chest pain  Inpatient Medications    Scheduled Meds: . aspirin  81 mg Oral Daily  . atorvastatin  80 mg Oral q1800  . furosemide  40 mg Intravenous BID  . metoprolol tartrate  12.5 mg Oral BID  . sodium chloride flush  3 mL Intravenous Q12H  . ticagrelor  90 mg Oral BID   Continuous Infusions: . sodium chloride 10 mL/hr at 04/05/18 0800  . sodium chloride    . nitroGLYCERIN Stopped (04/05/18 0733)   PRN Meds: sodium chloride, acetaminophen, HYDROcodone-acetaminophen, nitroGLYCERIN, ondansetron (ZOFRAN) IV, perflutren lipid microspheres (DEFINITY) IV suspension, sodium chloride flush   Vital Signs    Vitals:   04/05/18 0600 04/05/18 0700 04/05/18 0800 04/05/18 0819  BP: 121/78   109/69  Pulse: 81 86 80 98  Resp: 14 18 15    Temp:    97.6 F (36.4 C)  TempSrc:    Oral  SpO2: 98% 97% 95% 94%  Weight:      Height:        Intake/Output Summary (Last 24 hours) at 04/05/2018 0952 Last data filed at 04/05/2018 0800 Gross per 24 hour  Intake 909.96 ml  Output 1700 ml  Net -790.04 ml    I/O since admission: -790  Filed Weights   04/04/18 1233  Weight: 95.3 kg    Telemetry    Sinus 94- Personally Reviewed  ECG    ECG (independently read by me): NSR at 84; mild reisidual ST V1-3  Physical Exam   BP 109/69 (BP Location: Left Arm)   Pulse 98   Temp 97.6 F (36.4 C) (Oral)   Resp 15   Ht 5\' 4"  (1.626 m)   Wt 95.3 kg   SpO2 94%   BMI 36.05 kg/m  General: Alert, oriented, no distress.  Skin: normal turgor, no rashes, warm and dry HEENT: Normocephalic, atraumatic. Pupils equal round and reactive to light; sclera anicteric; extraocular muscles intact; Fundi ** Nose without nasal septal hypertrophy Mouth/Parynx benign; Mallinpatti scale 3 Neck: No JVD, no carotid bruits; normal carotid upstroke Lungs: clear to  ausculatation and percussion; no wheezing or rales Chest wall: without tenderness to palpitation Heart: PMI not displaced, RRR, s1 s2 normal, 1/6 systolic murmur, no diastolic murmur, no rubs, gallops, thrills, or heaves Abdomen: moderate central adiposity; soft, nontender; no hepatosplenomehaly, BS+; abdominal aorta nontender and not dilated by palpation. Back: no CVA tenderness Pulses 2+ Musculoskeletal: full range of motion, normal strength, no joint deformities Extremities: no clubbing cyanosis or edema, Homan's sign negative  Neurologic: grossly nonfocal; Cranial nerves grossly wnl Psychologic: Normal mood and affect   Labs    Chemistry Recent Labs  Lab 04/04/18 1248 04/05/18 0615  NA 139 138  K 4.1 4.9  CL 103 103  CO2 22 22  GLUCOSE 164* 124*  BUN 27* 29*  CREATININE 0.83 0.97  CALCIUM 9.0 8.9  PROT 7.1  --   ALBUMIN 4.0  --   AST 19  --   ALT 12  --   ALKPHOS 61  --   BILITOT 0.7  --   GFRNONAA >60 53*  GFRAA >60 >60  ANIONGAP 14 13     Hematology Recent Labs  Lab 04/04/18 1248 04/05/18 0615  WBC 12.3* 12.9*  RBC 4.22 3.61*  HGB 13.8 11.6*  HCT 43.4 36.1  MCV 102.8* 100.0  MCH 32.7  32.1  MCHC 31.8 32.1  RDW 13.6 13.7  PLT 237 224    Cardiac Enzymes Recent Labs  Lab 04/04/18 1248 04/04/18 1842 04/05/18 0035 04/05/18 0615  TROPONINI 0.06* >65.00* >65.00* >65.00*   No results for input(s): TROPIPOC in the last 168 hours.   BNPNo results for input(s): BNP, PROBNP in the last 168 hours.   DDimer No results for input(s): DDIMER in the last 168 hours.   Lipid Panel     Component Value Date/Time   CHOL 230 (H) 04/04/2018 1248   TRIG 271 (H) 04/04/2018 1248   HDL 50 04/04/2018 1248   CHOLHDL 4.6 04/04/2018 1248   VLDL 54 (H) 04/04/2018 1248   LDLCALC 126 (H) 04/04/2018 1248    Radiology    No results found.  Cardiac Studies    Dist RCA lesion is 95% stenosed. This occurs just before a high bifurcation of the large posterior lateral  artery and posterior descending artery.  LV end diastolic pressure is severely elevated. LVEDP 30 mm Hg.  There is no aortic valve stenosis.  Ost LAD to Prox LAD lesion is 100% stenosed.  A drug-eluting stent was successfully placed using a STENT SYNERGY DES 3X38.  Post intervention, there is a 0% residual stenosis.  Mid LAD lesion is 75% stenosed, distal to the stented area.  Mid LM to Dist LM lesion is 30% stenosed.   Complex multivessel disease.  Given her age and elevated LVEDP, we did not think she would be a good candidate for emergency surgery.  Therefore, we stented the LAD to restore TIMI-3 flow.  There is still residual distal left main disease and mid LAD disease which was not treated today.  There is complex bifurcation disease on the right.  The plan will be to let her recover over the next 4 weeks, on dual antiplatelet therapy.  Then we can consider whether she would be a candidate for bypass surgery after that.  Recommend uninterrupted dual antiplatelet therapy with Aspirin 81mg  daily and Ticagrelor 90mg  twice daily for a minimum of 12 months (ACS - Class I recommendation).  DAPT may need to be temporarily held for CABG after 30 days.   High dose statin, beta blocker and DAPT orally.  For now, will give IV Lasix and IV NTG to help with volume overload.           Patient Profile     84 y.o. female who presented with anterior STEMI s/p emergent PCI with posible need for future CABG.  Assessment & Plan    1. Day 1 s/p Anterior STEMI 2/2 LAD occlusion Rx with DES stent as above.  Trop >65.  IV NTG was weaned to off, with concomitant CAD add oral nitrates, BB and as BP allows post-MI ACE-I. Echo today.  F/U ECG.  2. HLD:  LDL 126. now on atorvastatin 80 mg  3. First degree AV block  Signed, Lennette Bihari, MD, [facility] 04/05/2018, 9:52 AM

## 2018-04-06 DIAGNOSIS — I255 Ischemic cardiomyopathy: Secondary | ICD-10-CM

## 2018-04-06 DIAGNOSIS — I480 Paroxysmal atrial fibrillation: Secondary | ICD-10-CM

## 2018-04-06 LAB — HEPARIN LEVEL (UNFRACTIONATED): HEPARIN UNFRACTIONATED: 0.46 [IU]/mL (ref 0.30–0.70)

## 2018-04-06 MED ORDER — AMIODARONE LOAD VIA INFUSION
150.0000 mg | Freq: Once | INTRAVENOUS | Status: AC
  Administered 2018-04-06: 150 mg via INTRAVENOUS
  Filled 2018-04-06: qty 83.34

## 2018-04-06 MED ORDER — AMIODARONE HCL IN DEXTROSE 360-4.14 MG/200ML-% IV SOLN
60.0000 mg/h | INTRAVENOUS | Status: DC
  Administered 2018-04-06: 60 mg/h via INTRAVENOUS
  Filled 2018-04-06 (×2): qty 200

## 2018-04-06 MED ORDER — AMIODARONE HCL IN DEXTROSE 360-4.14 MG/200ML-% IV SOLN
30.0000 mg/h | INTRAVENOUS | Status: DC
  Administered 2018-04-06 – 2018-04-07 (×3): 30 mg/h via INTRAVENOUS
  Filled 2018-04-06 (×2): qty 200

## 2018-04-06 MED ORDER — HEPARIN BOLUS VIA INFUSION
3500.0000 [IU] | Freq: Once | INTRAVENOUS | Status: AC
  Administered 2018-04-06: 3500 [IU] via INTRAVENOUS
  Filled 2018-04-06: qty 3500

## 2018-04-06 MED ORDER — HEPARIN (PORCINE) IN NACL 100-0.45 UNIT/ML-% IJ SOLN
1050.0000 [IU]/h | INTRAMUSCULAR | Status: DC
  Administered 2018-04-06 – 2018-04-07 (×2): 1050 [IU]/h via INTRAVENOUS
  Filled 2018-04-06 (×2): qty 250

## 2018-04-06 NOTE — Progress Notes (Signed)
ANTICOAGULATION CONSULT NOTE - Initial Consult  Pharmacy Consult for IV heparin  Indication: atrial fibrillation  No Known Allergies  Patient Measurements: Height: 5\' 4"  (162.6 cm) Weight: 210 lb (95.3 kg) IBW/kg (Calculated) : 54.7 Heparin Dosing Weight: 76.4 kg  Vital Signs: Temp: 98 F (36.7 C) (10/13 1600) Temp Source: Oral (10/13 1600) BP: 113/66 (10/13 1600) Pulse Rate: 80 (10/13 1120)  Labs: Recent Labs    04/04/18 1248 04/04/18 1842 04/05/18 0035 04/05/18 0615 04/06/18 1839  HGB 13.8  --   --  11.6*  --   HCT 43.4  --   --  36.1  --   PLT 237  --   --  224  --   APTT 33  --   --   --   --   LABPROT 13.5  --   --   --   --   INR 1.04  --   --   --   --   HEPARINUNFRC  --   --   --   --  0.46  CREATININE 0.83  --   --  0.97  --   TROPONINI 0.06* >65.00* >65.00* >65.00*  --     Estimated Creatinine Clearance: 50.9 mL/min (by C-G formula based on SCr of 0.97 mg/dL).  Assessment: 70 yoF s/p STEMI 2/2 LAD occlusion treated with DES stent. Now with new onset afib. Pharmacy consulted for IV heparin dosing. Initial heparin level within goal range at 0.46. Hgb 11.6, pltc WNL. No bleeding noted.    Goal of Therapy:  Heparin level 0.3-0.7 units/ml Monitor platelets by anticoagulation protocol: Yes   Plan:  Continue heparin gtt at 1050 units/hr Recheck confirmatory level with AM labs Daily heparin level and CBC F/u plans for [facility] if afib persists  Karan Ramnauth N. Zigmund Daniel, PharmD PGY2 Infectious Diseases Pharmacy Resident Phone: [phone] 04/06/2018 7:25 PM

## 2018-04-06 NOTE — Progress Notes (Signed)
  Amiodarone Drug - Drug Interaction Consult Note  Recommendations: None, monitor for now.  Amiodarone is metabolized by the cytochrome P450 system and therefore has the potential to cause many drug interactions. Amiodarone has an average plasma half-life of 50 days (range 20 to 100 days).   There is potential for drug interactions to occur several weeks or months after stopping treatment and the onset of drug interactions may be slow after initiating amiodarone.   [x]  Statins: Increased risk of myopathy. Simvastatin- restrict dose to 20mg  daily. Other statins: counsel patients to report any muscle pain or weakness immediately.  []  Anticoagulants: Amiodarone can increase anticoagulant effect. Consider warfarin dose reduction. Patients should be monitored closely and the dose of anticoagulant altered accordingly, remembering that amiodarone levels take several weeks to stabilize.  []  Antiepileptics: Amiodarone can increase plasma concentration of phenytoin, the dose should be reduced. Note that small changes in phenytoin dose can result in large changes in levels. Monitor patient and counsel on signs of toxicity.  [x]  Beta blockers: increased risk of bradycardia, AV block and myocardial depression. Sotalol - avoid concomitant use.  []   Calcium channel blockers (diltiazem and verapamil): increased risk of bradycardia, AV block and myocardial depression.  []   Cyclosporine: Amiodarone increases levels of cyclosporine. Reduced dose of cyclosporine is recommended.  []  Digoxin dose should be halved when amiodarone is started.  []  Diuretics: increased risk of cardiotoxicity if hypokalemia occurs.  []  Oral hypoglycemic agents (glyburide, glipizide, glimepiride): increased risk of hypoglycemia. Patient's glucose levels should be monitored closely when initiating amiodarone therapy.   []  Drugs that prolong the QT interval:  Torsades de pointes risk may be increased with concurrent use - avoid if  possible.  Monitor QTc, also keep magnesium/potassium WNL if concurrent therapy can't be avoided. Marland Kitchen Antibiotics: e.g. fluoroquinolones, erythromycin. . Antiarrhythmics: e.g. quinidine, procainamide, disopyramide, sotalol. . Antipsychotics: e.g. phenothiazines, haloperidol.  . Lithium, tricyclic antidepressants, and methadone. Thank You,  Gardner Candle  04/06/2018 10:05 AM

## 2018-04-06 NOTE — Plan of Care (Signed)
  Problem: Clinical Measurements: Goal: Diagnostic test results will improve Outcome: Progressing Goal: Respiratory complications will improve Outcome: Progressing   Problem: Activity: Goal: Risk for activity intolerance will decrease Outcome: Progressing   

## 2018-04-06 NOTE — Progress Notes (Signed)
ANTICOAGULATION CONSULT NOTE - Initial Consult  Pharmacy Consult for IV heparin  Indication: atrial fibrillation  No Known Allergies  Patient Measurements: Height: 5\' 4"  (162.6 cm) Weight: 210 lb (95.3 kg) IBW/kg (Calculated) : 54.7 Heparin Dosing Weight: 76.4 kg  Vital Signs: Temp: 97.5 F (36.4 C) (10/13 0401) Temp Source: Oral (10/13 0401) BP: 108/67 (10/13 0728) Pulse Rate: 90 (10/13 0728)  Labs: Recent Labs    04/04/18 1248 04/04/18 1842 04/05/18 0035 04/05/18 0615  HGB 13.8  --   --  11.6*  HCT 43.4  --   --  36.1  PLT 237  --   --  224  APTT 33  --   --   --   LABPROT 13.5  --   --   --   INR 1.04  --   --   --   CREATININE 0.83  --   --  0.97  TROPONINI 0.06* >65.00* >65.00* >65.00*    Estimated Creatinine Clearance: 50.9 mL/min (by C-G formula based on SCr of 0.97 mg/dL).   Medical History: History reviewed. No pertinent past medical history.  Medications:  Infusions:  . sodium chloride Stopped (04/05/18 1127)  . sodium chloride    . amiodarone     Followed by  . amiodarone    . heparin      Assessment: 84 yo female s/p STEMI 2/2 LAD occlusion treated with DES stent.  Now with new onset AF, pharmacy asked to begin anticoagulation with IV heparin.  Baseline CBC WNL.    Goal of Therapy:  Heparin level 0.3-0.7 units/ml Monitor platelets by anticoagulation protocol: Yes   Plan:  1. Heparin 3500 unit IV bolus x 1. 2. Then start heparin gtt at 1050 units/hr. 3. Check heparin level in 8 hrs. 4. Daily heparin level and CBC. 5. F/u plans for oral anticoagulation if afib persists?  Jenetta Downer, [facility] Clinical Pharmacist Phone [phone]  04/06/2018 10:22 AM

## 2018-04-06 NOTE — Progress Notes (Signed)
Progress Note  Patient Name: [name] Date of Encounter: 04/06/2018  Primary Cardiologist: new, Dr. Eldridge Dace  Subjective   No recurrent chest pain;  Developed AF this am  Inpatient Medications    Scheduled Meds: . aspirin  81 mg Oral Daily  . atorvastatin  80 mg Oral q1800  . isosorbide mononitrate  30 mg Oral Daily  . metoprolol tartrate  12.5 mg Oral BID  . sodium chloride flush  3 mL Intravenous Q12H  . ticagrelor  90 mg Oral BID   Continuous Infusions: . sodium chloride Stopped (04/05/18 1127)  . sodium chloride     PRN Meds: sodium chloride, acetaminophen, HYDROcodone-acetaminophen, nitroGLYCERIN, ondansetron (ZOFRAN) IV, sodium chloride flush   Vital Signs    Vitals:   04/06/18 0000 04/06/18 0400 04/06/18 0401 04/06/18 0728  BP: 92/60 (!) 89/58 106/61 108/67  Pulse: 80 79 75 90  Resp: 18 16 16 20   Temp: 98.2 F (36.8 C)  (!) 97.5 F (36.4 C)   TempSrc: Oral  Oral   SpO2: 94% 95% 95% 93%  Weight:      Height:        Intake/Output Summary (Last 24 hours) at 04/06/2018 0930 Last data filed at 04/06/2018 0500 Gross per 24 hour  Intake 513.84 ml  Output 300 ml  Net 213.84 ml    I/O since admission: -326  Filed Weights   04/04/18 1233  Weight: 95.3 kg    Telemetry    AF around 100, earlier 114- Personally Reviewed  ECG    ECG (independently read by me): AF 114; Evolving anterior STT changes   ECG (independently read by me): NSR at 84; mild reisidual ST V1-3  Physical Exam   BP 108/67 (BP Location: Left Arm)   Pulse 90   Temp (!) 97.5 F (36.4 C) (Oral)   Resp 20   Ht 5\' 4"  (1.626 m)   Wt 95.3 kg   SpO2 93%   BMI 36.05 kg/m  General: Alert, oriented, no distress.  Skin: normal turgor, no rashes, warm and dry HEENT: Normocephalic, atraumatic. Pupils equal round and reactive to light; sclera anicteric; extraocular muscles intact; Fundi ** Nose without nasal septal hypertrophy Mouth/Parynx benign; Mallinpatti scale 3 Neck: No  JVD, no carotid bruits; normal carotid upstroke Lungs: clear to ausculatation and percussion; no wheezing or rales Chest wall: without tenderness to palpitation Heart: PMI not displaced, irreg, irreg   , s1 s2 normal, 1/6 systolic murmur, no diastolic murmur, no rubs, gallops, thrills, or heaves Abdomen: central adiposity; soft, nontender; no hepatosplenomehaly, BS+; abdominal aorta nontender and not dilated by palpation. Back: no CVA tenderness Pulses 2+ Musculoskeletal: full range of motion, normal strength, no joint deformities Extremities: no clubbing cyanosis or edema, Homan's sign negative  Neurologic: grossly nonfocal; Cranial nerves grossly wnl Psychologic: Normal mood and affect   Labs    Chemistry Recent Labs  Lab 04/04/18 1248 04/05/18 0615  NA 139 138  K 4.1 4.9  CL 103 103  CO2 22 22  GLUCOSE 164* 124*  BUN 27* 29*  CREATININE 0.83 0.97  CALCIUM 9.0 8.9  PROT 7.1  --   ALBUMIN 4.0  --   AST 19  --   ALT 12  --   ALKPHOS 61  --   BILITOT 0.7  --   GFRNONAA >60 53*  GFRAA >60 >60  ANIONGAP 14 13     Hematology Recent Labs  Lab 04/04/18 1248 04/05/18 0615  WBC 12.3* 12.9*  RBC  4.22 3.61*  HGB 13.8 11.6*  HCT 43.4 36.1  MCV 102.8* 100.0  MCH 32.7 32.1  MCHC 31.8 32.1  RDW 13.6 13.7  PLT 237 224    Cardiac Enzymes Recent Labs  Lab 04/04/18 1248 04/04/18 1842 04/05/18 0035 04/05/18 0615  TROPONINI 0.06* >65.00* >65.00* >65.00*   No results for input(s): TROPIPOC in the last 168 hours.   BNPNo results for input(s): BNP, PROBNP in the last 168 hours.   DDimer No results for input(s): DDIMER in the last 168 hours.   Lipid Panel     Component Value Date/Time   CHOL 230 (H) 04/04/2018 1248   TRIG 271 (H) 04/04/2018 1248   HDL 50 04/04/2018 1248   CHOLHDL 4.6 04/04/2018 1248   VLDL 54 (H) 04/04/2018 1248   LDLCALC 126 (H) 04/04/2018 1248    Radiology    No results found.  Cardiac Studies    Dist RCA lesion is 95% stenosed. This  occurs just before a high bifurcation of the large posterior lateral artery and posterior descending artery.  LV end diastolic pressure is severely elevated. LVEDP 30 mm Hg.  There is no aortic valve stenosis.  Ost LAD to Prox LAD lesion is 100% stenosed.  A drug-eluting stent was successfully placed using a STENT SYNERGY DES 3X38.  Post intervention, there is a 0% residual stenosis.  Mid LAD lesion is 75% stenosed, distal to the stented area.  Mid LM to Dist LM lesion is 30% stenosed.   Complex multivessel disease.  Given her age and elevated LVEDP, we did not think she would be a good candidate for emergency surgery.  Therefore, we stented the LAD to restore TIMI-3 flow.  There is still residual distal left main disease and mid LAD disease which was not treated today.  There is complex bifurcation disease on the right.  The plan will be to let her recover over the next 4 weeks, on dual antiplatelet therapy.  Then we can consider whether she would be a candidate for bypass surgery after that.  Recommend uninterrupted dual antiplatelet therapy with Aspirin 81mg  daily and Ticagrelor 90mg  twice daily for a minimum of 12 months (ACS - Class I recommendation).  DAPT may need to be temporarily held for CABG after 30 days.   High dose statin, beta blocker and DAPT orally.  For now, will give IV Lasix and IV NTG to help with volume overload.           ------------------------------------------------------------------- 04/04/2018 ECHO Study Conclusions  - Left ventricle: The cavity size was normal. Wall thickness was   increased in a pattern of mild LVH. Systolic function was mildly   to moderately reduced. The estimated ejection fraction was in the   range of 40% to 45%. Anterior, anteroseptal, apical and   inferoapical akinesis suggestive of LAD territory infarct. No   apical thrombus by Definity contrast. The study is not   technically sufficient to allow evaluation of LV  diastolic   function. - Aortic valve: Sclerosis without stenosis. There was mild   regurgitation. - Mitral valve: Mildly thickened leaflets . There was trivial   regurgitation. - Left atrium: The atrium was normal in size. - Inferior vena cava: The vessel was dilated. The respirophasic   diameter changes were blunted (< 50%), consistent with elevated   central venous pressure.  Impressions:  - LVEF 40-45%, mild LVH, LAD territory infarct, no apical thrombus,   mild AI, trivial MR, normal LA size, dilated IVC.  Patient  Profile     84 y.o. female who presented with anterior STEMI s/p emergent PCI with posible need for future CABG.  Assessment & Plan    1. Day 2 s/p Anterior STEMI 2/2 LAD occlusion Rx with DES stent as above.  Trop >65.  IV NTG was weaned to off, with concomitant CAD oral nitrates and BB added as BP allows post-MI ACE-I. Echo shows EF 40 - 45% with LAD territory WMA.  2. New onset AF; rate 114.   BP now 100. Will start iv amiodarone bolus and infusion. BP limits further titraton of BB presently, but increase as BP allows. Will start heparin.  3. HLD:  LDL 126. now on atorvastatin 80 mg    Signed, Lennette Bihari, MD, [facility] 04/06/2018, 9:30 AM

## 2018-04-07 ENCOUNTER — Encounter (HOSPITAL_COMMUNITY): Payer: Self-pay | Admitting: Interventional Cardiology

## 2018-04-07 LAB — BASIC METABOLIC PANEL
ANION GAP: 8 (ref 5–15)
BUN: 32 mg/dL — ABNORMAL HIGH (ref 8–23)
CO2: 27 mmol/L (ref 22–32)
Calcium: 8.7 mg/dL — ABNORMAL LOW (ref 8.9–10.3)
Chloride: 102 mmol/L (ref 98–111)
Creatinine, Ser: 0.91 mg/dL (ref 0.44–1.00)
GFR calc non Af Amer: 58 mL/min — ABNORMAL LOW (ref >60)
GLUCOSE: 129 mg/dL — AB (ref 70–99)
POTASSIUM: 4 mmol/L (ref 3.5–5.1)
Sodium: 137 mmol/L (ref 135–145)

## 2018-04-07 LAB — HEPARIN LEVEL (UNFRACTIONATED): HEPARIN UNFRACTIONATED: 0.47 [IU]/mL (ref 0.30–0.70)

## 2018-04-07 LAB — MAGNESIUM: Magnesium: 1.7 mg/dL (ref 1.7–2.4)

## 2018-04-07 MED ORDER — AMIODARONE HCL 200 MG PO TABS
400.0000 mg | ORAL_TABLET | Freq: Two times a day (BID) | ORAL | Status: DC
  Administered 2018-04-07 – 2018-04-08 (×3): 400 mg via ORAL
  Filled 2018-04-07 (×3): qty 2

## 2018-04-07 MED FILL — Nitroglycerin IV Soln 100 MCG/ML in D5W: INTRA_ARTERIAL | Qty: 10 | Status: AC

## 2018-04-07 NOTE — Progress Notes (Addendum)
Progress Note  Patient Name: [name] Date of Encounter: 04/07/2018  Primary Cardiologist: Dr. Eldridge Dace   Subjective   Doing well today. Remains chest pain free. Denies shortness of breath. Has not been out of bed since admission.   Inpatient Medications    Scheduled Meds: . aspirin  81 mg Oral Daily  . atorvastatin  80 mg Oral q1800  . isosorbide mononitrate  30 mg Oral Daily  . metoprolol tartrate  12.5 mg Oral BID  . sodium chloride flush  3 mL Intravenous Q12H  . ticagrelor  90 mg Oral BID   Continuous Infusions: . sodium chloride Stopped (04/05/18 1127)  . sodium chloride    . amiodarone 30 mg/hr (04/07/18 0801)   PRN Meds: sodium chloride, acetaminophen, HYDROcodone-acetaminophen, nitroGLYCERIN, ondansetron (ZOFRAN) IV, sodium chloride flush   Vital Signs    Vitals:   04/07/18 0355 04/07/18 0515 04/07/18 0730 04/07/18 0800  BP:  118/65 122/63   Pulse:  79 76 75  Resp:  (!) 21 19 17   Temp: 98.7 F (37.1 C)     TempSrc: Oral     SpO2:  96% 95% 94%  Weight:  95.3 kg    Height:        Intake/Output Summary (Last 24 hours) at 04/07/2018 0936 Last data filed at 04/07/2018 0900 Gross per 24 hour  Intake 1245.4 ml  Output 1325 ml  Net -79.6 ml   Filed Weights   04/04/18 1233 04/07/18 0515  Weight: 95.3 kg 95.3 kg    Telemetry    NSR, few PACs and PVCs - Personally Reviewed  ECG    NSR, 1st degree AVB, ST elevations V1-V3, left axis deviation - Personally Reviewed  Physical Exam   GEN: very pleasant female, well-appearing, in no acute distress.   Neck: No JVD Cardiac: RRR, no murmurs, rubs, or gallops.  Respiratory: Clear to auscultation bilaterally. MS: No edema; No deformity. Neuro:  No focal deficits noted  Psych: Normal affect   Labs    Chemistry Recent Labs  Lab 04/04/18 1248 04/05/18 0615 04/07/18 0238  NA 139 138 137  K 4.1 4.9 4.0  CL 103 103 102  CO2 22 22 27   GLUCOSE 164* 124* 129*  BUN 27* 29* 32*  CREATININE  0.83 0.97 0.91  CALCIUM 9.0 8.9 8.7*  PROT 7.1  --   --   ALBUMIN 4.0  --   --   AST 19  --   --   ALT 12  --   --   ALKPHOS 61  --   --   BILITOT 0.7  --   --   GFRNONAA >60 53* 58*  GFRAA >60 >60 >60  ANIONGAP 14 13 8      Hematology Recent Labs  Lab 04/04/18 1248 04/05/18 0615  WBC 12.3* 12.9*  RBC 4.22 3.61*  HGB 13.8 11.6*  HCT 43.4 36.1  MCV 102.8* 100.0  MCH 32.7 32.1  MCHC 31.8 32.1  RDW 13.6 13.7  PLT 237 224    Cardiac Enzymes Recent Labs  Lab 04/04/18 1248 04/04/18 1842 04/05/18 0035 04/05/18 0615  TROPONINI 0.06* >65.00* >65.00* >65.00*   No results for input(s): TROPIPOC in the last 168 hours.   BNPNo results for input(s): BNP, PROBNP in the last 168 hours.   DDimer No results for input(s): DDIMER in the last 168 hours.   Radiology    No results found.  Cardiac Studies    Dist RCA lesion is 95% stenosed. This occurs  just before a high bifurcation of the large posterior lateral artery and posterior descending artery.  LV end diastolic pressure is severely elevated. LVEDP 30 mm Hg.  There is no aortic valve stenosis.  Ost LAD to Prox LAD lesion is 100% stenosed.  A drug-eluting stent was successfully placed using a STENT SYNERGY DES 3X38.  Post intervention, there is a 0% residual stenosis.  Mid LAD lesion is 75% stenosed, distal to the stented area.  Mid LM to Dist LM lesion is 30% stenosed.  Complex multivessel disease. Given her age and elevated LVEDP, we did not think she would be a good candidate for emergency surgery. Therefore, we stented the LAD to restore TIMI-3 flow. There is still residual distal left main disease and mid LAD disease which was not treated today. There is complex bifurcation disease on the right. The plan will be to let her recover over the next 4 weeks, on dual antiplatelet therapy. Then we can consider whether she would be a candidate for bypass surgery after that.  Recommend uninterrupted dual  antiplatelet therapy with Aspirin 81mg  daily and Ticagrelor 90mg  twice daily for a minimum of 12 months (ACS - Class I recommendation).DAPT may need to be temporarily held for CABG after 30 days.   High dose statin, beta blocker and DAPT orally. For now, will give IV Lasix and IV NTG to help with volume overload.           ------------------------------------------------------------------- 04/04/2018 ECHO Study Conclusions  - Left ventricle: The cavity size was normal. Wall thickness was increased in a pattern of mild LVH. Systolic function was mildly to moderately reduced. The estimated ejection fraction was in the range of 40% to 45%. Anterior, anteroseptal, apical and inferoapical akinesis suggestive of LAD territory infarct. No apical thrombus by Definity contrast. The study is not technically sufficient to allow evaluation of LV diastolic function. - Aortic valve: Sclerosis without stenosis. There was mild regurgitation. - Mitral valve: Mildly thickened leaflets . There was trivial regurgitation. - Left atrium: The atrium was normal in size. - Inferior vena cava: The vessel was dilated. The respirophasic diameter changes were blunted (<50%), consistent with elevated central venous pressure.  Impressions:  - LVEF 40-45%, mild LVH, LAD territory infarct, no apical thrombus, mild AI, trivial MR, normal LA size, dilated IVC.   Patient Profile     84 y.o. female with no know previous cardiac history who presents 3 days ago with anterior STEMI s/p DES to LAD   Assessment & Plan    1. Anterior STEMI s/p DES top proximal LAD: This is HD#3. She is doing well currently with no acute complaints and remains chest pain free. On baby ASA, Brillinta, low dose metoprolol, Imdur, and high-intensity statin. Ace inhibitor has not been started due to soft BP.  TTE showed EF 40 % with LAD territory wall motion abnormalities. Will repeat echo in 3 months.  She has not been out of bed since admission. Discussed with her she will need to start moving and ambulating. Cardiac rehab consult placed. Transfer to telemetry.    2. New onset, post-MI atrial fibrillation: On amiodarone gtt. Converted to NSR yesterday (10/13) and has remained in NSR. Will switch to PO amiodarone 400 mg BID tomorrow. HR 70-80s. Heparin gtt discontinued, will not start anticoagulation at this time.    3. Hyperlipidemia: LDL 126. Continue high-intensity statin. LDL goal < 90.    For questions or updates, please contact [facility] Please consult [email or web address] for contact info under  Signed, Burna Cash, MD  04/07/2018, 9:36 AM    Agree with assessment and plan by Dr. Evelene Croon  Day 3 anterior STEMI treated with PCI and stenting of the proximal LAD by Dr. Eldridge Dace performed radially.  LV function was moderately depressed at 40 with corresponding wall motion abnormalities.  There is moderate segmental disease beyond the stented segment as well as high-grade mid dominant RCA bifurcation.  She is on dual antiplatelet therapy.  She did have brief A. fib with converted to sinus rhythm on IV amiodarone.  Other problems as outlined.  She is hemodynamically stable.  She said no recurrent chest pain.  Her exam is benign.  Agree with transfer to telemetry, cardiac rehab.  Will transition to p.o. amiodarone for now and consider discharge in the next 24 to 48 hours.  Runell Gess, M.D., FACP, [facility], [facility] [address] [place], [place]  [address]  [phone] 04/07/2018 10:19 AM

## 2018-04-07 NOTE — Care Management Note (Addendum)
Case Management Note  Patient Details  Name: [name] MRN: [id number] Date of Birth: 1934/06/23  Subjective/Objective:  84 y.o. female presented with CP; s/p STEMI with PCI.                  Action/Plan: CM met with patient to discuss transitional needs. Patient lived at home with spouse, independent with ADLs with no DME in use. Patient confirmed PCP as: Kandyce Rud PA-C of [facility]; pharmacy of choice: [facility], [place] CM discussed [place] with patient agreeable and referral entered in Epic. Brilinta benefits check complete with montly est cost $45, with patient informed and Brilinta card provided. CM discussed the [place] service (meds to bed), with patient requesting her Rxs be filled prior to transitioning home. Patient's family will provide transportation home and assist with post transitional needs. CM will continue to follow.   Expected Discharge Date:                  Expected Discharge Plan:  Home/Self Care  In-House Referral:  [facility]  Discharge planning Services  CM Consult, Medication Assistance(Brilinta benefits check/Brilinta card)  Post Acute Care Choice:  NA Choice offered to:  NA  DME Arranged:  N/A DME Agency:  NA  HH Arranged:  NA HH Agency:  NA  Status of Service:  In process, will continue to follow  If discussed at Long Length of Stay Meetings, dates discussed:    Additional Comments:  Midge Minium RN, BSN, NCM-BC, ACM-RN [phone] 04/07/2018, 12:34 PM

## 2018-04-07 NOTE — Care Management (Signed)
#   2.  S/W [place]  @ [employer]   # [phone] OPT- 2  TICAGRELOR- NONE FORMULARY  BRILINTA  90 MG BID COVER- YES CO-PAY- $ 45.00 TIER- 3 DRUG PRIOR APPROVAL- YES  PREFERRED PHARMACY : YES [facility]  M/O 90 DAY SUPPLY  FOR M/O AND RETAIL $ 90.00

## 2018-04-07 NOTE — Plan of Care (Signed)
  Problem: Coping: Goal: Level of anxiety will decrease Outcome: Progressing   Problem: Coping: Goal: Level of anxiety will decrease Outcome: Progressing   Problem: Coping: Goal: Level of anxiety will decrease Outcome: Progressing   Problem: Coping: Goal: Level of anxiety will decrease Outcome: Progressing   Problem: Coping: Goal: Level of anxiety will decrease Outcome: Progressing   Problem: Coping: Goal: Level of anxiety will decrease Outcome: Progressing   Problem: Coping: Goal: Level of anxiety will decrease Outcome: Progressing   Problem: Coping: Goal: Level of anxiety will decrease Outcome: Progressing   Problem: Coping: Goal: Level of anxiety will decrease Outcome: Progressing

## 2018-04-07 NOTE — Progress Notes (Signed)
Pharmacy Counseling Note  [name] is a 51 YOF who presented on 10/11 as a code STEMI. She was emergently taken to the cath lab and found to have multi-vessel disease. A DES was placed in her LAD with potential need for a CABG in the future.  Post-cath, she has been placed on ASA 81 mg daily and Ticagrelor 90 mg BID. Prior to admission, [name] was taking multiple herbal medicines including: garlic, gingko biloba, tumeric, aloe vera, chitosan, nutmeg, vitamin C, lutein, oil of oregano, and spirulina. After checking for interactions between her herbal medicines and her new medications (amiodarone, aspirin, atorvastatin, imdur, metoprolol, and ticagrelor), I recommended she stop taking garlic, ginkgo, and tumeric as these could increase her risk of bleeding. She verbalized understanding and was amenable to stopping these herbal medicines.  Arvilla Market, PharmD PGY1 Pharmacy Resident Phone [phone] 04/07/2018     2:52 PM

## 2018-04-07 NOTE — Progress Notes (Signed)
CARDIAC REHAB PHASE I   PRE:  Rate/Rhythm: 89 SR  BP:  Sitting: 106/58        SaO2: 88 RA  MODE:  Ambulation: 390 ft 102 pk HR  POST:  Rate/Rhythm: 86 SR  BP:  Sitting: 114/62        SaO2: 91 RA  Patient ambulated 390 ft assisted 1 with gait belt and walker. Patient denies sob and chest pain. Reviewed importance of Brilinta. Reviewed MI book and stent card. Encouraged ambulation as tolerated after DC. Patient for OHS in ~1 month. Referred to [facility] in [facility]. Will continue to follow.   [phone], [place] 04/07/2018 2:14 PM

## 2018-04-07 NOTE — Progress Notes (Signed)
Transferred pt to 3 East @1615  to room 23. Transferred pt from wheelchair to bed. Stayed with pt in room until RN arrived and switched ECG leads over to 3 east tele box.

## 2018-04-07 NOTE — Consult Note (Signed)
   [facility] CM Inpatient Consult   04/07/2018  [name] 17-Jan-1934 [id number]  Referral received from inpatient RNCM, Dorene Grebe. Patient was evaluated for [facility] Care Management services for post hospital follow up. Came by to see the patient and she was receiving testing and transferring off the unit.  Will follow up as time allows. For questions, please contact:  Charlesetta Shanks, RN BSN CCM [facility]  [phone] business mobile phone Toll free office [phone]

## 2018-04-08 ENCOUNTER — Encounter (HOSPITAL_COMMUNITY): Payer: Self-pay | Admitting: Cardiology

## 2018-04-08 DIAGNOSIS — E785 Hyperlipidemia, unspecified: Secondary | ICD-10-CM

## 2018-04-08 DIAGNOSIS — I5021 Acute systolic (congestive) heart failure: Secondary | ICD-10-CM

## 2018-04-08 DIAGNOSIS — I480 Paroxysmal atrial fibrillation: Secondary | ICD-10-CM

## 2018-04-08 MED ORDER — TICAGRELOR 90 MG PO TABS: 90.0000 mg | ORAL_TABLET | Freq: Two times a day (BID) | ORAL | 6 refills | Status: DC

## 2018-04-08 MED ORDER — NITROGLYCERIN 0.4 MG SL SUBL: 0.4000 mg | SUBLINGUAL_TABLET | SUBLINGUAL | 2 refills | Status: DC | PRN

## 2018-04-08 MED ORDER — METOPROLOL SUCCINATE ER 25 MG PO TB24: 25.0000 mg | ORAL_TABLET | Freq: Every day | ORAL | 1 refills | Status: DC

## 2018-04-08 MED ORDER — ASPIRIN 81 MG PO CHEW: 81.0000 mg | CHEWABLE_TABLET | Freq: Every day | ORAL | Status: DC

## 2018-04-08 MED ORDER — ATORVASTATIN CALCIUM 80 MG PO TABS: 80.0000 mg | ORAL_TABLET | Freq: Every day | ORAL | 2 refills | Status: DC

## 2018-04-08 MED ORDER — AMIODARONE HCL 200 MG PO TABS: ORAL_TABLET | ORAL | 1 refills | Status: DC

## 2018-04-08 MED ORDER — ISOSORBIDE MONONITRATE ER 30 MG PO TB24: 30.0000 mg | ORAL_TABLET | Freq: Every day | ORAL | 2 refills | Status: DC

## 2018-04-08 MED FILL — ISOSORBIDE MN ER 30 MG TAB: 30 | 30 days supply | Qty: 30 | Fill #0

## 2018-04-08 MED FILL — BRILINTA 90 MG TABLET: 90 | 30 days supply | Qty: 60 | Fill #0

## 2018-04-08 MED FILL — AMIODARONE HCL 200 MG TABS: 200 | 30 days supply | Qty: 90 | Fill #0

## 2018-04-08 MED FILL — NITROGLYCERIN 0.4 MG TAB SL: 0.4 | 25 days supply | Qty: 25 | Fill #0

## 2018-04-08 MED FILL — METOPROLOL SUCCINATE ER 25: 25 | 30 days supply | Qty: 30 | Fill #0

## 2018-04-08 MED FILL — ATORVASTATIN CALCIUM 80 MG: 80 | 30 days supply | Qty: 30 | Fill #0

## 2018-04-08 NOTE — Care Management Important Message (Signed)
Important Message  Patient Details  Name: [name] MRN: [id number] Date of Birth: [birth year]   Medicare Important Message Given:  Yes    Teralyn Mullins P Lizzett Nobile 04/08/2018, 11:13 AM

## 2018-04-08 NOTE — Progress Notes (Signed)
CARDIAC REHAB PHASE I   Offered to walk with pt, pt declining. Pt helped to BR. Again reinforced MI education. Wrote down importance of Asprin, Brilinta, and NTG in MI book for pt. Marked page on restrictions. Reinforced importance of mobility. Pt referred to [facility] [place], but probably wont be able to attend until after she gets revascularized. Pt hopeful for d/c today.  [id number] Reynold Bowen, RN BSN 04/08/2018 10:20 AM

## 2018-04-08 NOTE — Plan of Care (Signed)
  Problem: Education: Goal: Knowledge of General Education information will improve Description Including pain rating scale, medication(s)/side effects and non-pharmacologic comfort measures Outcome: Progressing   Problem: Health Behavior/Discharge Planning: Goal: Ability to manage health-related needs will improve Outcome: Progressing   Problem: Clinical Measurements: Goal: Respiratory complications will improve Outcome: Progressing   Problem: Coping: Goal: Level of anxiety will decrease Outcome: Progressing   Problem: Elimination: Goal: Will not experience complications related to urinary retention Outcome: Progressing   Problem: Pain Managment: Goal: General experience of comfort will improve Outcome: Progressing   Problem: Safety: Goal: Ability to remain free from injury will improve Outcome: Progressing   Problem: Activity: Goal: Ability to tolerate increased activity will improve Outcome: Progressing

## 2018-04-08 NOTE — Progress Notes (Addendum)
Progress Note  Patient Name: [name] Date of Encounter: 04/08/2018  Primary Cardiologist: Dr. Eldridge Dace   Subjective   Doing well with no acute complaints this AM. Was able to ambulate without difficulty yesterday. Denies chest pain and shortness of breath.   Inpatient Medications    Scheduled Meds: . amiodarone  400 mg Oral BID  . aspirin  81 mg Oral Daily  . atorvastatin  80 mg Oral q1800  . isosorbide mononitrate  30 mg Oral Daily  . metoprolol tartrate  12.5 mg Oral BID  . sodium chloride flush  3 mL Intravenous Q12H  . ticagrelor  90 mg Oral BID   Continuous Infusions: . sodium chloride Stopped (04/05/18 1127)  . sodium chloride     PRN Meds: sodium chloride, acetaminophen, HYDROcodone-acetaminophen, nitroGLYCERIN, ondansetron (ZOFRAN) IV, sodium chloride flush   Vital Signs    Vitals:   04/07/18 1540 04/07/18 1954 04/08/18 0400 04/08/18 0428  BP:  102/61  110/63  Pulse:  (!) 102  77  Resp:  18  20  Temp: 98.4 F (36.9 C) 98.9 F (37.2 C)  99 F (37.2 C)  TempSrc: Oral Oral  Oral  SpO2:  94%  92%  Weight:   94.7 kg   Height:        Intake/Output Summary (Last 24 hours) at 04/08/2018 4098 Last data filed at 04/08/2018 0400 Gross per 24 hour  Intake 1150.96 ml  Output 500 ml  Net 650.96 ml   Filed Weights   04/04/18 1233 04/07/18 0515 04/08/18 0400  Weight: 95.3 kg 95.3 kg 94.7 kg    Telemetry    NSR - Personally Reviewed  ECG    None performed today - Personally Reviewed  Physical Exam   GEN: well-appearing female, no acute distress.   Neck: No JVD Cardiac: RRR, no murmurs, rubs, or gallops.  Respiratory: Clear to auscultation bilaterally. GI: Soft, nontender, non-distended  MS: No edema; No deformity. Neuro:  Nonfocal  Psych: Normal affect   Labs    Chemistry Recent Labs  Lab 04/04/18 1248 04/05/18 0615 04/07/18 0238  NA 139 138 137  K 4.1 4.9 4.0  CL 103 103 102  CO2 22 22 27   GLUCOSE 164* 124* 129*  BUN 27* 29*  32*  CREATININE 0.83 0.97 0.91  CALCIUM 9.0 8.9 8.7*  PROT 7.1  --   --   ALBUMIN 4.0  --   --   AST 19  --   --   ALT 12  --   --   ALKPHOS 61  --   --   BILITOT 0.7  --   --   GFRNONAA >60 53* 58*  GFRAA >60 >60 >60  ANIONGAP 14 13 8      Hematology Recent Labs  Lab 04/04/18 1248 04/05/18 0615  WBC 12.3* 12.9*  RBC 4.22 3.61*  HGB 13.8 11.6*  HCT 43.4 36.1  MCV 102.8* 100.0  MCH 32.7 32.1  MCHC 31.8 32.1  RDW 13.6 13.7  PLT 237 224    Cardiac Enzymes Recent Labs  Lab 04/04/18 1248 04/04/18 1842 04/05/18 0035 04/05/18 0615  TROPONINI 0.06* >65.00* >65.00* >65.00*   No results for input(s): TROPIPOC in the last 168 hours.   BNPNo results for input(s): BNP, PROBNP in the last 168 hours.   DDimer No results for input(s): DDIMER in the last 168 hours.   Radiology    No results found.  Cardiac Studies    Dist RCA lesion is 95% stenosed.  This occurs just before a high bifurcation of the large posterior lateral artery and posterior descending artery.  LV end diastolic pressure is severely elevated. LVEDP 30 mm Hg.  There is no aortic valve stenosis.  Ost LAD to Prox LAD lesion is 100% stenosed.  A drug-eluting stent was successfully placed using a STENT SYNERGY DES 3X38.  Post intervention, there is a 0% residual stenosis.  Mid LAD lesion is 75% stenosed, distal to the stented area.  Mid LM to Dist LM lesion is 30% stenosed.  Complex multivessel disease. Given her age and elevated LVEDP, we did not think she would be a good candidate for emergency surgery. Therefore, we stented the LAD to restore TIMI-3 flow. There is still residual distal left main disease and mid LAD disease which was not treated today. There is complex bifurcation disease on the right. The plan will be to let her recover over the next 4 weeks, on dual antiplatelet therapy. Then we can consider whether she would be a candidate for bypass surgery after that.  Recommend  uninterrupted dual antiplatelet therapy with Aspirin 81mg  daily and Ticagrelor 90mg  twice daily for a minimum of 12 months (ACS - Class I recommendation).DAPT may need to be temporarily held for CABG after 30 days.   High dose statin, beta blocker and DAPT orally. For now, will give IV Lasix and IV NTG to help with volume overload.           ------------------------------------------------------------------- 04/04/2018 ECHOStudy Conclusions  - Left ventricle: The cavity size was normal. Wall thickness was increased in a pattern of mild LVH. Systolic function was mildly to moderately reduced. The estimated ejection fraction was in the range of 40% to 45%. Anterior, anteroseptal, apical and inferoapical akinesis suggestive of LAD territory infarct. No apical thrombus by Definity contrast. The study is not technically sufficient to allow evaluation of LV diastolic function. - Aortic valve: Sclerosis without stenosis. There was mild regurgitation. - Mitral valve: Mildly thickened leaflets . There was trivial regurgitation. - Left atrium: The atrium was normal in size. - Inferior vena cava: The vessel was dilated. The respirophasic diameter changes were blunted (<50%), consistent with elevated central venous pressure.  Impressions:  - LVEF 40-45%, mild LVH, LAD territory infarct, no apical thrombus, mild AI, trivial MR, normal LA size, dilated IVC.  Patient Profile     84 y.o. female with no known cardiac history who presented to the ED with chest pain and found to have anterior STEMI now s/p PCI and stenting to proximal LAD.   Assessment & Plan    1. Anterior STEMI: HD#4. Continues to do well and remains chest pain free. EF depressed at 40 %. Has been ambulating without difficulty.  Will continue DAPT with ASA and Brillinta. Pharmacy reviewed her medications and asked her to stop taking garlic, tumeric, and gingko biloba as it can increase her  risk of bleeding. Will continue low dose metoprolol as her BP remain soft as well as Imdur. Has been referred to [facility] in [facility]. She is stable for discharge today.    2. CAD: s/p DES to proximal LAD. Has residual disease in distal LAD and midRCA. She was not a candidate for emergent surgery due to age and increased LVEDP. We will reassess in 4 weeks and consider if she would be a good candidate for CABG.   3. Post-MI atrial fibrillation: Telemetry reviewed. Remains in NSR. Continue amiodarone 40 mg BID.   4. HLD: Continue high intensity statin. LDL goal <  90.   For questions or updates, please contact [facility] Please consult [email or web address] for contact info under        Signed, Burna Cash, MD  04/08/2018, 6:09 AM    Agree with note by Dr. Evelene Croon  Day for anterior STEMI treated with PCI and drug-eluting stenting of the proximal LAD by Dr. Eldridge Dace.  She does have moderate segmental disease of the mid LAD after the stented segment as well as the mid dominant RCA at a bifurcation point.  She has moderate LV dysfunction.  She did have brief [place] MI PAF which resolved and now is on p.o. amiodarone.  She is maintaining sinus rhythm.  Other problems as outlined.  She said no chest pain.  She is walking with cardiac rehab without limitation.  Her exam is benign.  Plan is for discharge home today, TOC 7, follow-up with Dr. Eldridge Dace in 3 to 4 weeks.  At that time, he will decide optimal revascularization timing and strategy.  Runell Gess, M.D., FACP, [facility], [facility] [address] [place], [place]  [address]  [phone] 04/08/2018 9:45 AM

## 2018-04-08 NOTE — Discharge Summary (Addendum)
Discharge Summary    Patient ID: [name],  MRN: [id number], DOB/AGE: [birth year] 84 y.o.  Admit date: 04/04/2018 Discharge date: 04/08/2018  Primary Care Provider: Pearson Forster  Primary Cardiologist: Dr. Eldridge Dace   Discharge Diagnoses    Active Problems:   Acute anterior wall MI (HCC)   PAF (paroxysmal atrial fibrillation) (HCC)   Acute systolic heart failure (HCC)   Hyperlipidemia   Allergies No Known Allergies  Diagnostic Studies/Procedures    Cath: 04/04/18   Dist RCA lesion is 95% stenosed. This occurs just before a high bifurcation of the large posterior lateral artery and posterior descending artery.  LV end diastolic pressure is severely elevated. LVEDP 30 mm Hg.  There is no aortic valve stenosis.  Ost LAD to Prox LAD lesion is 100% stenosed.  A drug-eluting stent was successfully placed using a STENT SYNERGY DES 3X38.  Post intervention, there is a 0% residual stenosis.  Mid LAD lesion is 75% stenosed, distal to the stented area.  Mid LM to Dist LM lesion is 30% stenosed.   Complex multivessel disease.  Given her age and elevated LVEDP, we did not think she would be a good candidate for emergency surgery.  Therefore, we stented the LAD to restore TIMI-3 flow.  There is still residual distal left main disease and mid LAD disease which was not treated today.  There is complex bifurcation disease on the right.  The plan will be to let her recover over the next 4 weeks, on dual antiplatelet therapy.  Then we can consider whether she would be a candidate for bypass surgery after that.  Recommend uninterrupted dual antiplatelet therapy with Aspirin 81mg  daily and Ticagrelor 90mg  twice daily for a minimum of 12 months (ACS - Class I recommendation).  DAPT may need to be temporarily held for CABG after 30 days.   High dose statin, beta blocker and DAPT orally.  For now, will give IV Lasix and IV NTG to help with volume overload.  TTE:  04/05/18  Study Conclusions  - Left ventricle: The cavity size was normal. Wall thickness was   increased in a pattern of mild LVH. Systolic function was mildly   to moderately reduced. The estimated ejection fraction was in the   range of 40% to 45%. Anterior, anteroseptal, apical and   inferoapical akinesis suggestive of LAD territory infarct. No   apical thrombus by Definity contrast. The study is not   technically sufficient to allow evaluation of LV diastolic   function. - Aortic valve: Sclerosis without stenosis. There was mild   regurgitation. - Mitral valve: Mildly thickened leaflets . There was trivial   regurgitation. - Left atrium: The atrium was normal in size. - Inferior vena cava: The vessel was dilated. The respirophasic   diameter changes were blunted (< 50%), consistent with elevated   central venous pressure.  Impressions:  - LVEF 40-45%, mild LVH, LAD territory infarct, no apical thrombus,   mild AI, trivial MR, normal LA size, dilated IVC. _____________   History of Present Illness     Ms. [name] is an 84 yp female with no prior cardiac history.  She began having chest discomfort about an hour before she presented to [facility].  It felt like a tightness around her chest and began while she was shopping.  She had shortness of breath along with nausea and vomiting.  Initial ECG showed acute anterior ST elevation MI.  She had transferred to [facility]  for emergency cardiac catheterization.  Her pain currently was a 10 out of 10 on arrival.   Hospital Course     Underwent cardiac cath noted above with dRCA stenosis 95% just before a bifurcation of the large PDL and PDA, with 100% pLAD. Successful PCI/DES x1 to the LAD. Residual 75% lesion in the mLAD. It was felt given her advanced age she would not be a good candidate for emergency surgery, therefore her LAD was stented emergently. Placed on DAPT with ASA/Brilinta for at least 6 months. Ideally plan to  allow for recovery over the next 4 weeks, then consider intervention to the RCA. Trop peaked at >65 x3. LVEDP noted at 30 mmHg and she was diuresed with IV lasix. LDL noted at 126 and placed on high dose statin. Also added low dose BB with blood pressure tolerating. Developed new onset Afib and placed on IV amiodarone with bolus, along with heparin. Converted to SR the following day and transitioned to 400mg  BID amiodarone. Will plan to taper amiodarone from 400mg  BID x2 weeks, then 200mg  BID x2 weeks, then 200mg  daily.  Given the episode of afib was brief, decision made not to anticoagulate at this time. Worked well with cardiac rehab without recurrent chest pain.    [name] was seen by Dr. Allyson Sabal and determined stable for discharge home. Follow up in the office has been arranged. Medications are listed below.   _____________  Discharge Vitals Blood pressure 110/63, pulse 77, temperature 99 F (37.2 C), temperature source Oral, resp. rate 20, height 5\' 4"  (1.626 m), weight 94.7 kg, SpO2 92 %.  Filed Weights   04/04/18 1233 04/07/18 0515 04/08/18 0400  Weight: 95.3 kg 95.3 kg 94.7 kg    Labs & Radiologic Studies    CBC No results for input(s): WBC, NEUTROABS, HGB, HCT, MCV, PLT in the last 72 hours. Basic Metabolic Panel Recent Labs    21/30/86 0238  NA 137  K 4.0  CL 102  CO2 27  GLUCOSE 129*  BUN 32*  CREATININE 0.91  CALCIUM 8.7*  MG 1.7   Liver Function Tests No results for input(s): AST, ALT, ALKPHOS, BILITOT, PROT, ALBUMIN in the last 72 hours. No results for input(s): LIPASE, AMYLASE in the last 72 hours. Cardiac Enzymes No results for input(s): CKTOTAL, CKMB, CKMBINDEX, TROPONINI in the last 72 hours. BNP Invalid input(s): POCBNP D-Dimer No results for input(s): DDIMER in the last 72 hours. Hemoglobin A1C No results for input(s): HGBA1C in the last 72 hours. Fasting Lipid Panel No results for input(s): CHOL, HDL, LDLCALC, TRIG, CHOLHDL, LDLDIRECT in the last  72 hours. Thyroid Function Tests No results for input(s): TSH, T4TOTAL, T3FREE, THYROIDAB in the last 72 hours.  Invalid input(s): FREET3 _____________  No results found. Disposition   Pt is being discharged home today in good condition.  Follow-up Plans & Appointments    Follow-up Information    [facility] Follow up.   Why:  Medication Management Contact information: [address] [place] [address] [phone]       Allayne Butcher, PA-C On 04/16/2018.   Specialties:  Cardiology, Radiology Why:  at 11:30am for your follow up appt.  Contact information: [address] [place] [address] [phone]          Discharge Instructions    Amb Referral to Cardiac Rehabilitation   Complete by:  As directed    Refer to cardiac rehab program at [facility].   Diagnosis:   STEMI Coronary Stents     Call MD for:  redness, tenderness, or signs of infection (pain, swelling, redness, odor or green/yellow discharge around incision site)   Complete by:  As directed    Diet - low sodium heart healthy   Complete by:  As directed    Discharge instructions   Complete by:  As directed    Radial Site Care Refer to this sheet in the next few weeks. These instructions provide you with information on caring for yourself after your procedure. Your caregiver may also give you more specific instructions. Your treatment has been planned according to current medical practices, but problems sometimes occur. Call your caregiver if you have any problems or questions after your procedure. HOME CARE INSTRUCTIONS You may shower the day after the procedure.Remove the bandage (dressing) and gently wash the site with plain soap and water.Gently pat the site dry.  Do not apply powder or lotion to the site.  Do not submerge the affected site in water for 3 to 5 days.  Inspect the site at least twice daily.  Do not flex or  bend the affected arm for 24 hours.  No lifting over 5 pounds (2.3 kg) for 5 days after your procedure.  Do not drive home if you are discharged the same day of the procedure. Have someone else drive you.  You may drive 48 hours after the procedure unless otherwise instructed by your caregiver.  What to expect: Any bruising will usually fade within 1 to 2 weeks.  Blood that collects in the tissue (hematoma) may be painful to the touch. It should usually decrease in size and tenderness within 1 to 2 weeks.  SEEK IMMEDIATE MEDICAL CARE IF: You have unusual pain at the radial site.  You have redness, warmth, swelling, or pain at the radial site.  You have drainage (other than a small amount of blood on the dressing).  You have chills.  You have a fever or persistent symptoms for more than 72 hours.  You have a fever and your symptoms suddenly get worse.  Your arm becomes pale, cool, tingly, or numb.  You have heavy bleeding from the site. Hold pressure on the site.   PLEASE DO NOT MISS ANY DOSES OF YOUR BRILINTA!!!!! Also keep a log of you blood pressures and bring back to your follow up appt. Please call the office with any questions.   Patients taking blood thinners should generally stay away from medicines like ibuprofen, Advil, Motrin, naproxen, and Aleve due to risk of stomach bleeding. You may take Tylenol as directed or talk to your primary doctor about alternatives.   Increase activity slowly   Complete by:  As directed       Discharge Medications     Medication List    STOP taking these medications   Garlic 1000 MG Caps   Ginkgo Biloba 40 MG Tabs   HYDROcodone-acetaminophen 5-325 MG tablet Commonly known as:  NORCO/VICODIN   Lutein 20 MG Caps   Oil of Oregano 1500 MG Caps   Spirulina 500 MG Tabs   Turmeric 450 MG Caps     TAKE these medications   amiodarone 200 MG tablet Commonly known as:  PACERONE Take 400mg  (2 tabs) twice a day for 2 weeks, then 200mg  (1  tab) twice a day for 2 weeks, then 200mg  daily.   aspirin 81 MG chewable tablet Chew 1 tablet (81 mg total) by mouth daily.  atorvastatin 80 MG tablet Commonly known as:  LIPITOR Take 1 tablet (80 mg total) by mouth daily at 6 PM.   isosorbide mononitrate 30 MG 24 hr tablet Commonly known as:  IMDUR Take 1 tablet (30 mg total) by mouth daily.   MAGONATE PO Take 1 tablet by mouth daily.   metoprolol succinate 25 MG 24 hr tablet Commonly known as:  TOPROL-XL Take 1 tablet (25 mg total) by mouth daily.   multivitamin with minerals Tabs tablet Take 1 tablet by mouth daily.   nitroGLYCERIN 0.4 MG SL tablet Commonly known as:  NITROSTAT Place 1 tablet (0.4 mg total) under the tongue every 5 (five) minutes as needed for chest pain.   Omega-3 1000 MG Caps Take 1,000 mg by mouth daily.   ticagrelor 90 MG Tabs tablet Commonly known as:  BRILINTA Take 1 tablet (90 mg total) by mouth 2 (two) times daily.   Vitamin D-3 5000 units Tabs Take 5,000 Units by mouth daily.       Acute coronary syndrome (MI, NSTEMI, STEMI, etc) this admission?: Yes.     AHA/ACC Clinical Performance & Quality Measures: 1. Aspirin prescribed? - Yes 2. ADP Receptor Inhibitor (Plavix/Clopidogrel, Brilinta/Ticagrelor or Effient/Prasugrel) prescribed (includes medically managed patients)? - Yes 3. Beta Blocker prescribed? - Yes 4. High Intensity Statin (Lipitor 40-80mg  or Crestor 20-40mg ) prescribed? - Yes 5. EF assessed during THIS hospitalization? - Yes 6. For EF <40%, was ACEI/ARB prescribed? - Not Applicable (EF >/= 40%) 7. For EF <40%, Aldosterone Antagonist (Spironolactone or Eplerenone) prescribed? - Not Applicable (EF >/= 40%) 8. Cardiac Rehab Phase II ordered (Included Medically managed Patients)? - Yes    Outstanding Labs/Studies   FLP/LFTs in 6 weeks if tolerating statin.   Duration of Discharge Encounter   Greater than 30 minutes including physician time.  Signed, Laverda Page  NP-C 04/08/2018, 10:43 AM    Agree with note by Laverda Page NP-C  Day for anterior STEMI treated with PCI and drug-eluting stenting by Dr. Eldridge Dace with moderate disease in the mid LAD and mid dominant RCA.  EF is 40%.  She is been stable since intervention.  She denies chest pain.  She did have brief PAF but sinus rhythm since that time on p.o. amiodarone.  She is on dual antiplatelet therapy.  Her exam is benign.  She stable for discharge today.  She will be seen back in the office by a APP in 7 to 10 days and by Dr. Eldridge Dace in 3 to 4 weeks.  Runell Gess, M.D., FACP, [facility], [facility] [address] [place], [place]  [address]  [phone] 04/08/2018 12:42 PM

## 2018-04-08 NOTE — Plan of Care (Signed)
  Problem: Activity: Goal: Ability to tolerate increased activity will improve Outcome: Adequate for Discharge   Problem: Cardiac: Goal: Vascular access site(s) Level 0-1 will be maintained Outcome: Adequate for Discharge   Problem: Education: Goal: Knowledge of General Education information will improve Description Including pain rating scale, medication(s)/side effects and non-pharmacologic comfort measures Outcome: Adequate for Discharge

## 2018-04-09 ENCOUNTER — Other Ambulatory Visit: Payer: Self-pay

## 2018-04-09 NOTE — Patient Outreach (Signed)
[facility]) Care Management  [facility] Care Manager  04/09/2018   [name] 06-06-1934 [id number]  Subjective: Telephone call to patient for initial assessment.  Patient reports she is doing ok and feels good. She was discharged on yesterday from the hospital. Patient expresses that she has not read her discharge papers when CM began talking about them.  Discussed with patient the importance of reading discharge papers. Patient states she will get to them but expresses just getting home on yesterday afternoon.  Discussed signs of MI and importance of follow up with physician.  She verbalized understanding.  Patient reports seeing the hospital liaison and is agreeable to calls for the next few weeks.  MD noted to do own TOC calls so CM will open to CM program for MI.    Objective:   Encounter Medications:  Outpatient Encounter Medications as of 04/09/2018  Medication Sig  . amiodarone (PACERONE) 200 MG tablet Take 400mg  (2 tabs) twice a day for 2 weeks, then 200mg  (1 tab) twice a day for 2 weeks, then 200mg  daily.  Marland Kitchen aspirin 81 MG chewable tablet Chew 1 tablet (81 mg total) by mouth daily.  Marland Kitchen atorvastatin (LIPITOR) 80 MG tablet Take 1 tablet (80 mg total) by mouth daily at 6 PM.  . Cholecalciferol (VITAMIN D-3) 5000 UNITS TABS Take 5,000 Units by mouth daily.   . isosorbide mononitrate (IMDUR) 30 MG 24 hr tablet Take 1 tablet (30 mg total) by mouth daily.  . Magnesium Carbonate (MAGONATE PO) Take 1 tablet by mouth daily.  . metoprolol succinate (TOPROL XL) 25 MG 24 hr tablet Take 1 tablet (25 mg total) by mouth daily.  . Multiple Vitamin (MULITIVITAMIN WITH MINERALS) TABS Take 1 tablet by mouth daily.  . nitroGLYCERIN (NITROSTAT) 0.4 MG SL tablet Place 1 tablet (0.4 mg total) under the tongue every 5 (five) minutes as needed for chest pain.  . Omega-3 1000 MG CAPS Take 1,000 mg by mouth daily.   . ticagrelor (BRILINTA) 90 MG TABS tablet Take 1 tablet (90 mg total) by mouth  2 (two) times daily.   No facility-administered encounter medications on file as of 04/09/2018.     Functional Status:  In your present state of health, do you have any difficulty performing the following activities: 04/09/2018 04/05/2018  Hearing? N N  Vision? N N  Difficulty concentrating or making decisions? N N  Walking or climbing stairs? N N  Dressing or bathing? N N  Doing errands, shopping? N N  Preparing Food and eating ? N -  Using the Toilet? N -  In the past six months, have you accidently leaked urine? N -  Do you have problems with loss of bowel control? N -  Managing your Medications? N -  Managing your Finances? N -  Housekeeping or managing your Housekeeping? N -  Some recent data might be hidden    Fall/Depression Screening: Fall Risk  04/09/2018  Falls in the past year? No   PHQ 2/9 Scores 04/09/2018  PHQ - 2 Score 0    Assessment:  [facility]) Care Management  [facility] Care Manager  04/09/2018   [name] May 17, 1934 [id number]  Subjective:   Objective:   Encounter Medications:  Outpatient Encounter Medications as of 04/09/2018  Medication Sig  . amiodarone (PACERONE) 200 MG tablet Take 400mg  (2 tabs) twice a day for 2 weeks, then 200mg  (1 tab) twice a day for 2 weeks, then 200mg  daily.  Marland Kitchen aspirin  81 MG chewable tablet Chew 1 tablet (81 mg total) by mouth daily.  Marland Kitchen atorvastatin (LIPITOR) 80 MG tablet Take 1 tablet (80 mg total) by mouth daily at 6 PM.  . Cholecalciferol (VITAMIN D-3) 5000 UNITS TABS Take 5,000 Units by mouth daily.   . isosorbide mononitrate (IMDUR) 30 MG 24 hr tablet Take 1 tablet (30 mg total) by mouth daily.  . Magnesium Carbonate (MAGONATE PO) Take 1 tablet by mouth daily.  . metoprolol succinate (TOPROL XL) 25 MG 24 hr tablet Take 1 tablet (25 mg total) by mouth daily.  . Multiple Vitamin (MULITIVITAMIN WITH MINERALS) TABS Take 1 tablet by mouth daily.  . nitroGLYCERIN (NITROSTAT) 0.4 MG SL tablet  Place 1 tablet (0.4 mg total) under the tongue every 5 (five) minutes as needed for chest pain.  . Omega-3 1000 MG CAPS Take 1,000 mg by mouth daily.   . ticagrelor (BRILINTA) 90 MG TABS tablet Take 1 tablet (90 mg total) by mouth 2 (two) times daily.   No facility-administered encounter medications on file as of 04/09/2018.     Functional Status:  In your present state of health, do you have any difficulty performing the following activities: 04/09/2018 04/05/2018  Hearing? N N  Vision? N N  Difficulty concentrating or making decisions? N N  Walking or climbing stairs? N N  Dressing or bathing? N N  Doing errands, shopping? N N  Preparing Food and eating ? N -  Using the Toilet? N -  In the past six months, have you accidently leaked urine? N -  Do you have problems with loss of bowel control? N -  Managing your Medications? N -  Managing your Finances? N -  Housekeeping or managing your Housekeeping? N -  Some recent data might be hidden    Fall/Depression Screening: Fall Risk  04/09/2018  Falls in the past year? No   PHQ 2/9 Scores 04/09/2018  PHQ - 2 Score 0    Assessment: patient will benefit from phone calls for education and support post hospitalization.  Plan:  [facility] CM Care Plan Problem One     Most Recent Value  Care Plan Problem One  Recent Hospitalization MI  Role Documenting the Problem One  Care Management Telephonic Coordinator  Care Plan for Problem One  Active  [facility] Long Term Goal   Patient will not readmit within 31 days.  [facility] Long Term Goal Start Date  04/09/18  Interventions for Problem One Long Term Goal  Patient will report to all scheduled appointments.  Patient will know signs of MI and when to seek attention.    [facility] CM Short Term Goal #1   Patient will keep all medical appointments scheduled within the next 20 days   [facility] CM Short Term Goal #1 Start Date  04/09/18  Interventions for Short Term Goal #1  Discussed the importance of MD follow up.   Patient to write down any questions she has for the physician.    [facility] CM Short Term Goal #2   Patient will read through discharge instructions within 10 days.    [facility] CM Short Term Goal #2 Start Date  04/09/18  Interventions for Short Term Goal #2  RN CM discussed the importance of readinf discharge instructions.         RN CM will provide ongoing education for patient on myocardial infaraction through phone calls.  RN CM will send welcome packet with consent to patient.  RN CM will send initial barriers  letter, assessment, and care plan to primary care physician.  RN CM will contact patient within one week and patient agrees to next contact.   Bary Leriche, RN, MSN [facility] Care Management Care Management Coordinator Direct Line [phone] Cell [phone] Toll Free: [phone]  Fax: [phone]

## 2018-04-09 NOTE — Consult Note (Signed)
Late entry related to laptop issues:  04/08/18   Met with the patient at the bedside regarding [place] [facility] services s/p STEMI.. Patient states she has good family support and she has good transportation. Patient states she would like post hospital follow up.  Consent form signed and obtained.  Copy given with Folder with [facility] information.  She will have post hospital follow up complex disease management program. Her primary care provider is Virginia Rochester, [place] does the transition of care calls.   Explained that [place] Management does not replace or interfere with any arrangements or needs set up by the inpatient care management staff.  For questions, please contact:  Natividad Brood, RN BSN [facility] Liaison  [phone] business mobile phone Toll free office [phone]

## 2018-04-12 ENCOUNTER — Encounter (HOSPITAL_COMMUNITY): Payer: Self-pay | Admitting: Emergency Medicine

## 2018-04-12 ENCOUNTER — Inpatient Hospital Stay (HOSPITAL_COMMUNITY)
Admission: EM | Admit: 2018-04-12 | Discharge: 2018-04-16 | DRG: 282 | Disposition: A | Discharge status: Home Health | Payer: PPO | Attending: Cardiovascular Disease | Admitting: Cardiovascular Disease

## 2018-04-12 ENCOUNTER — Emergency Department (HOSPITAL_COMMUNITY): Payer: PPO

## 2018-04-12 ENCOUNTER — Other Ambulatory Visit: Payer: Self-pay

## 2018-04-12 DIAGNOSIS — Z7902 Long term (current) use of antithrombotics/antiplatelets: Secondary | ICD-10-CM | POA: Diagnosis not present

## 2018-04-12 DIAGNOSIS — I480 Paroxysmal atrial fibrillation: Secondary | ICD-10-CM | POA: Diagnosis present

## 2018-04-12 DIAGNOSIS — I2510 Atherosclerotic heart disease of native coronary artery without angina pectoris: Secondary | ICD-10-CM | POA: Diagnosis present

## 2018-04-12 DIAGNOSIS — I509 Heart failure, unspecified: Secondary | ICD-10-CM

## 2018-04-12 DIAGNOSIS — Z79899 Other long term (current) drug therapy: Secondary | ICD-10-CM | POA: Diagnosis not present

## 2018-04-12 DIAGNOSIS — E785 Hyperlipidemia, unspecified: Secondary | ICD-10-CM | POA: Diagnosis present

## 2018-04-12 DIAGNOSIS — I5021 Acute systolic (congestive) heart failure: Secondary | ICD-10-CM | POA: Diagnosis not present

## 2018-04-12 DIAGNOSIS — D72829 Elevated white blood cell count, unspecified: Secondary | ICD-10-CM | POA: Diagnosis not present

## 2018-04-12 DIAGNOSIS — I2109 ST elevation (STEMI) myocardial infarction involving other coronary artery of anterior wall: Secondary | ICD-10-CM | POA: Diagnosis present

## 2018-04-12 DIAGNOSIS — Z955 Presence of coronary angioplasty implant and graft: Secondary | ICD-10-CM | POA: Diagnosis not present

## 2018-04-12 DIAGNOSIS — Z7982 Long term (current) use of aspirin: Secondary | ICD-10-CM

## 2018-04-12 DIAGNOSIS — Z9049 Acquired absence of other specified parts of digestive tract: Secondary | ICD-10-CM

## 2018-04-12 DIAGNOSIS — I351 Nonrheumatic aortic (valve) insufficiency: Secondary | ICD-10-CM | POA: Diagnosis not present

## 2018-04-12 DIAGNOSIS — I5043 Acute on chronic combined systolic (congestive) and diastolic (congestive) heart failure: Secondary | ICD-10-CM | POA: Diagnosis not present

## 2018-04-12 DIAGNOSIS — Z8249 Family history of ischemic heart disease and other diseases of the circulatory system: Secondary | ICD-10-CM | POA: Diagnosis not present

## 2018-04-12 DIAGNOSIS — E669 Obesity, unspecified: Secondary | ICD-10-CM | POA: Diagnosis not present

## 2018-04-12 DIAGNOSIS — Z6830 Body mass index (BMI) 30.0-30.9, adult: Secondary | ICD-10-CM

## 2018-04-12 DIAGNOSIS — I340 Nonrheumatic mitral (valve) insufficiency: Secondary | ICD-10-CM | POA: Diagnosis not present

## 2018-04-12 DIAGNOSIS — R0602 Shortness of breath: Secondary | ICD-10-CM | POA: Diagnosis not present

## 2018-04-12 LAB — BASIC METABOLIC PANEL
ANION GAP: 9 (ref 5–15)
BUN: 31 mg/dL — ABNORMAL HIGH (ref 8–23)
CHLORIDE: 108 mmol/L (ref 98–111)
CO2: 21 mmol/L — AB (ref 22–32)
Calcium: 8.9 mg/dL (ref 8.9–10.3)
Creatinine, Ser: 1.03 mg/dL — ABNORMAL HIGH (ref 0.44–1.00)
GFR calc Af Amer: 57 mL/min — ABNORMAL LOW (ref >60)
GFR calc non Af Amer: 50 mL/min — ABNORMAL LOW (ref >60)
GLUCOSE: 127 mg/dL — AB (ref 70–99)
POTASSIUM: 4 mmol/L (ref 3.5–5.1)
Sodium: 138 mmol/L (ref 135–145)

## 2018-04-12 LAB — CBC
HCT: 37.1 % (ref 36.0–46.0)
HEMOGLOBIN: 11.4 g/dL — AB (ref 12.0–15.0)
MCH: 31.2 pg (ref 26.0–34.0)
MCHC: 30.7 g/dL (ref 30.0–36.0)
MCV: 101.6 fL — AB (ref 80.0–100.0)
Platelets: 226 10*3/uL (ref 150–400)
RBC: 3.65 MIL/uL — AB (ref 3.87–5.11)
RDW: 13.5 % (ref 11.5–15.5)
WBC: 13.3 10*3/uL — ABNORMAL HIGH (ref 4.0–10.5)
nRBC: 0 % (ref 0.0–0.2)

## 2018-04-12 LAB — I-STAT TROPONIN, ED: TROPONIN I, POC: 0.76 ng/mL — AB (ref 0.00–0.08)

## 2018-04-12 LAB — BRAIN NATRIURETIC PEPTIDE: B NATRIURETIC PEPTIDE 5: 1045 pg/mL — AB (ref 0.0–100.0)

## 2018-04-12 MED ORDER — TICAGRELOR 90 MG PO TABS
90.0000 mg | ORAL_TABLET | Freq: Two times a day (BID) | ORAL | Status: DC
  Administered 2018-04-12 – 2018-04-14 (×4): 90 mg via ORAL
  Filled 2018-04-12 (×5): qty 1

## 2018-04-12 MED ORDER — SODIUM CHLORIDE 0.9 % IV SOLN: 250.0000 mL | INTRAVENOUS | Status: DC | PRN

## 2018-04-12 MED ORDER — FUROSEMIDE 10 MG/ML IJ SOLN
20.0000 mg | Freq: Every day | INTRAMUSCULAR | Status: DC
  Administered 2018-04-14: 20 mg via INTRAVENOUS
  Filled 2018-04-12 (×3): qty 2

## 2018-04-12 MED ORDER — ASPIRIN 81 MG PO CHEW
81.0000 mg | CHEWABLE_TABLET | ORAL | Status: AC
  Administered 2018-04-14: 81 mg via ORAL
  Filled 2018-04-12: qty 1

## 2018-04-12 MED ORDER — ASPIRIN 81 MG PO CHEW
81.0000 mg | CHEWABLE_TABLET | Freq: Every day | ORAL | Status: DC
  Administered 2018-04-13 – 2018-04-16 (×3): 81 mg via ORAL
  Filled 2018-04-12 (×5): qty 1

## 2018-04-12 MED ORDER — SODIUM CHLORIDE 0.9 % IV SOLN
INTRAVENOUS | Status: DC
  Administered 2018-04-14: 01:00:00 via INTRAVENOUS

## 2018-04-12 MED ORDER — FERROUS SULFATE 325 (65 FE) MG PO TABS
325.0000 mg | ORAL_TABLET | Freq: Every day | ORAL | Status: DC
  Filled 2018-04-12: qty 1

## 2018-04-12 MED ORDER — NITROGLYCERIN 0.4 MG SL SUBL: 0.4000 mg | SUBLINGUAL_TABLET | SUBLINGUAL | Status: DC | PRN

## 2018-04-12 MED ORDER — SODIUM CHLORIDE 0.9% FLUSH: 3.0000 mL | INTRAVENOUS | Status: DC | PRN

## 2018-04-12 MED ORDER — LOSARTAN POTASSIUM 25 MG PO TABS
25.0000 mg | ORAL_TABLET | Freq: Every day | ORAL | Status: DC
  Administered 2018-04-12 – 2018-04-14 (×3): 25 mg via ORAL
  Filled 2018-04-12 (×4): qty 1

## 2018-04-12 MED ORDER — SODIUM CHLORIDE 0.9% FLUSH
3.0000 mL | Freq: Two times a day (BID) | INTRAVENOUS | Status: DC
  Administered 2018-04-12 – 2018-04-14 (×5): 3 mL via INTRAVENOUS

## 2018-04-12 MED ORDER — SODIUM CHLORIDE 0.9% FLUSH
3.0000 mL | INTRAVENOUS | Status: DC | PRN
  Administered 2018-04-14: 3 mL via INTRAVENOUS
  Filled 2018-04-12: qty 3

## 2018-04-12 MED ORDER — METOPROLOL SUCCINATE ER 25 MG PO TB24
25.0000 mg | ORAL_TABLET | Freq: Every day | ORAL | Status: DC
  Administered 2018-04-13 – 2018-04-16 (×4): 25 mg via ORAL
  Filled 2018-04-12 (×5): qty 1

## 2018-04-12 MED ORDER — VITAMIN D 1000 UNITS PO TABS
5000.0000 [IU] | ORAL_TABLET | Freq: Every day | ORAL | Status: DC
  Administered 2018-04-13 – 2018-04-16 (×4): 5000 [IU] via ORAL
  Filled 2018-04-12 (×5): qty 5

## 2018-04-12 MED ORDER — ISOSORBIDE MONONITRATE ER 30 MG PO TB24
30.0000 mg | ORAL_TABLET | Freq: Every day | ORAL | Status: DC
  Administered 2018-04-12 – 2018-04-16 (×5): 30 mg via ORAL
  Filled 2018-04-12 (×5): qty 1

## 2018-04-12 MED ORDER — AMIODARONE HCL 200 MG PO TABS
400.0000 mg | ORAL_TABLET | Freq: Two times a day (BID) | ORAL | Status: DC
  Administered 2018-04-12 – 2018-04-16 (×9): 400 mg via ORAL
  Filled 2018-04-12 (×9): qty 2

## 2018-04-12 MED ORDER — FUROSEMIDE 10 MG/ML IJ SOLN
40.0000 mg | Freq: Once | INTRAMUSCULAR | Status: AC
  Administered 2018-04-12: 40 mg via INTRAVENOUS
  Filled 2018-04-12: qty 4

## 2018-04-12 MED ORDER — SODIUM CHLORIDE 0.9% FLUSH
3.0000 mL | Freq: Two times a day (BID) | INTRAVENOUS | Status: DC
  Administered 2018-04-14 – 2018-04-15 (×3): 3 mL via INTRAVENOUS

## 2018-04-12 MED ORDER — ATORVASTATIN CALCIUM 80 MG PO TABS
80.0000 mg | ORAL_TABLET | Freq: Every day | ORAL | Status: DC
  Administered 2018-04-12 – 2018-04-15 (×4): 80 mg via ORAL
  Filled 2018-04-12 (×4): qty 1

## 2018-04-12 MED ORDER — ACETAMINOPHEN 325 MG PO TABS
650.0000 mg | ORAL_TABLET | ORAL | Status: DC | PRN
  Administered 2018-04-12 – 2018-04-16 (×9): 650 mg via ORAL
  Filled 2018-04-12 (×9): qty 2

## 2018-04-12 NOTE — ED Triage Notes (Signed)
Pt to ER for evaluation of shortness of breath, reports MI last Friday. Pt in NAD at this time. Rales heard bilaterally.

## 2018-04-12 NOTE — Care Management Note (Signed)
Case Management Note  Patient Details  Name: TIERRIA WATSON MRN: [id number] Date of Birth: 02-11-1934  Subjective/Objective:                    Action/Plan:  Patient recently DC'd. Is active w [facility].  May want to consider HH at time of DC for disease management. Patient from home w spouse. PCP [place], [facility]age [facility], [facility]  CM will continue to follow  Expected Discharge Date:                  Expected Discharge Plan:  Home/Self Care  In-House Referral:  [facility]  Discharge planning Services  CM Consult  Post Acute Care Choice:    Choice offered to:     DME Arranged:    DME Agency:     HH Arranged:    HH Agency:     Status of Service:  In process, will continue to follow  If discussed at Long Length of Stay Meetings, dates discussed:    Additional Comments:  Lawerance Sabal, RN 04/12/2018, 4:54 PM

## 2018-04-12 NOTE — Plan of Care (Signed)

## 2018-04-12 NOTE — Progress Notes (Signed)
See full H&P from PA 84 y.o. with anterior MI total proximal LAD 04/04/18 Review of angiogram shows some residual stenosis at distal stent edge and 95% mid to distal RCA residual disease. Troponin peaked over 65. D/c on Tuesday not on diuretic or ACE. Comes back with signs/symptoms of CHF. Troponin continues to drop less than one no acute ECG changes. CXR CHF cephalization and curley B lines. Compliant with meds sats 95%   Exam with no murmur basilar rales Lots of bruising at previous right radial cath sight Soft abdomen no edema and good peripheral pulses  Plan:  Start cozaar 25 mg  Lasix 20 mg iv daily  TTE to reassess EF was 40-45% 04/05/18 and r/o ischemic MR Tentatively put on for right heart cath and PCI/Stent of RCA on Monday   [facility]

## 2018-04-12 NOTE — H&P (Addendum)
Cardiology Admission History and Physical:   Patient ID: [name] MRN: [id number]; DOB: 1934/01/20   Admission date: 04/12/2018  Primary Care Provider: Daylene Katayama, PA Primary Cardiologist: Lance Muss, MD   Chief Complaint:  SOB   Patient Profile:   [name] is a 84 y.o. female with CAD s/p recent anterior wall STEMI 04/04/18 w/ cath showing severe 2V disease, treated with emergent PCI of 100% occluded pLCx with plans for stage RCA PCI for 95% distal stenosis, systolic HF w/ EF of 40% and atrial fibrillation, presenting to ED with SOB secondary to acute CHF.   History of Present Illness:   [name] has recent diagnosis of CAD. She was admitted 04/04/18 for acute anterior wall STEMI and was found to have dRCA stenosis 95% just before a bifurcation of the large PDL and PDA, with 100% pLAD. It was felt given her advanced age she would not be a good candidate for emergency surgery, therefore her LAD was stented emergently. Placed on DAPT with ASA/Brilinta for at least 6 months. Ideally plan to allow for recovery over the next 4 weeks, then consider intervention to the RCA. Trop peaked at >65 x3. LVEDP noted at 30 mmHg and she was diuresed with IV lasix. EF was 40% by echo. She also went into new onset atrial fibrillation andn required treatment with IV amiodarone, and later transitioned to PO. Will plan to taper amiodarone from 400mg  BID x2 weeks, then 200mg  BID x2 weeks, then 200mg  daily.  Given the episode of afib was brief, decision made not to anticoagulate at this time.  She was discharged home on 04/08/18. She now presents back to the [facility] ED w/ CC of SOB. Has been present since returning home but has worsened.  This morning she woke up with orthopnea and PND. She has has mild chest pressure, but not like her angina when she had her MI (more tight/squeezing pain). She reports full med compliance and has been restricting salt at home. She has not noticed any edema or  weight gain at home.  In the ED, BNP is elevated at 1,045. POC troponin is 0.76. BMP shows SCr at 1.03. K 4.0. CBC shows leukocytosis with WBC at 13.3 (previously 12.9). Hgb is stable at 11.6. She is afebrile. CXR pending. She is feeling better after getting dose of IV Lasix.   Past Medical History:  Diagnosis Date  . Acute systolic heart failure (HCC)   . Hyperlipidemia   . PAF (paroxysmal atrial fibrillation) (HCC)   . STEMI (ST elevation myocardial infarction) (HCC)    04/04/18 PCI/DES to the pLAD, 95% dRCA (possible staged intervention), 75% mLAD, EF 45%    Past Surgical History:  Procedure Laterality Date  . CHOLECYSTECTOMY    . CHOLECYSTECTOMY  08/09/2011   Procedure: LAPAROSCOPIC CHOLECYSTECTOMY;  Surgeon: Robyne Askew, MD;  Location: [facility] ORS;  Service: General;  Laterality: N/A;  . CORONARY/GRAFT ACUTE MI REVASCULARIZATION N/A 04/04/2018   Procedure: Coronary/Graft Acute MI Revascularization;  Surgeon: Corky Crafts, MD;  Location: [facility] INVASIVE CV LAB;  Service: Cardiovascular;  Laterality: N/A;  . HERNIA REPAIR    . LEFT HEART CATH AND CORONARY ANGIOGRAPHY N/A 04/04/2018   Procedure: LEFT HEART CATH AND CORONARY ANGIOGRAPHY;  Surgeon: Corky Crafts, MD;  Location: [facility] INVASIVE CV LAB;  Service: Cardiovascular;  Laterality: N/A;  . TUBAL LIGATION       Medications Prior to Admission: Prior to Admission medications   Medication Sig Start Date End Date  Taking? Authorizing Provider  amiodarone (PACERONE) 200 MG tablet Take 400mg  (2 tabs) twice a day for 2 weeks, then 200mg  (1 tab) twice a day for 2 weeks, then 200mg  daily. 04/08/18  Yes Arty Baumgartner, NP  APPLE CIDER VINEGAR PO Take 1 tablet by mouth daily.   Yes [provider]  aspirin 81 MG chewable tablet Chew 1 tablet (81 mg total) by mouth daily. 04/08/18  Yes Arty Baumgartner, NP  atorvastatin (LIPITOR) 80 MG tablet Take 1 tablet (80 mg total) by mouth daily at 6 PM. 04/08/18  Yes Arty Baumgartner, NP  Boswellia Serrata (BOSWELLIA PO) Take 1 tablet by mouth daily.   Yes [provider]  Cholecalciferol (VITAMIN D-3) 5000 UNITS TABS Take 5,000 Units by mouth daily.    Yes [provider]  ferrous sulfate 325 (65 FE) MG tablet Take 325 mg by mouth daily with breakfast.   Yes [provider]  GLUCOSAMINE HCL PO Take 1 tablet by mouth daily.   Yes [provider]  isosorbide mononitrate (IMDUR) 30 MG 24 hr tablet Take 1 tablet (30 mg total) by mouth daily. 04/08/18  Yes Laverda Page B, NP  Magnesium Carbonate (MAGONATE PO) Take 1 tablet by mouth daily.   Yes [provider]  metoprolol succinate (TOPROL XL) 25 MG 24 hr tablet Take 1 tablet (25 mg total) by mouth daily. 04/08/18  Yes Arty Baumgartner, NP  Misc Natural Products (GREEN TEA) TABS Take 1 tablet by mouth daily.   Yes [provider]  Misc Natural Products (LUTEIN 20 PO) Take 1 tablet by mouth daily.   Yes [provider]  Multiple Vitamin (MULTIVITAMIN) capsule Take 1 capsule by mouth daily.   Yes [provider]  nitroGLYCERIN (NITROSTAT) 0.4 MG SL tablet Place 1 tablet (0.4 mg total) under the tongue every 5 (five) minutes as needed for chest pain. 04/08/18  Yes Laverda Page B, NP  OIL OF OREGANO PO Take 1 tablet by mouth daily.   Yes [provider]  Omega-3 1000 MG CAPS Take 1,000 mg by mouth daily.    Yes [provider]  OVER THE COUNTER MEDICATION Lectin   Yes [provider]  SPIRULINA PO Take 500 mg by mouth daily.   Yes [provider]  ticagrelor (BRILINTA) 90 MG TABS tablet Take 1 tablet (90 mg total) by mouth 2 (two) times daily. 04/08/18  Yes Arty Baumgartner, NP     Allergies:   No Known Allergies  Social History:   Social History   Socioeconomic History  . Marital status: Married    Spouse name: Not on file  . Number of children: Not on file  . Years of education: Not on file  .  Highest education level: Not on file  Occupational History  . Not on file  Social Needs  . Financial resource strain: Not on file  . Food insecurity:    Worry: Not on file    Inability: Not on file  . Transportation needs:    Medical: Not on file    Non-medical: Not on file  Tobacco Use  . Smoking status: Never Smoker  . Smokeless tobacco: Never Used  Substance and Sexual Activity  . Alcohol use: No  . Drug use: No  . Sexual activity: Not on file  Lifestyle  . Physical activity:    Days per week: Not on file    Minutes per session: Not on file  . Stress:  Not on file  Relationships  . Social connections:    Talks on phone: Not on file    Gets together: Not on file    Attends religious service: Not on file    Active member of club or organization: Not on file    Attends meetings of clubs or organizations: Not on file    Relationship status: Not on file  . Intimate partner violence:    Fear of current or ex partner: Not on file    Emotionally abused: Not on file    Physically abused: Not on file    Forced sexual activity: Not on file  Other Topics Concern  . Not on file  Social History Narrative  . Not on file    Family History:   The patient's family history includes Hypertension in her mother.    ROS:  Please see the history of present illness.  All other ROS reviewed and negative.     Physical Exam/Data:   Vitals:   04/12/18 0952 04/12/18 1030 04/12/18 1100 04/12/18 1130  BP: 138/80 132/75 134/76 126/66  Pulse: 88 77 72 76  Resp: 20 (!) 9 18 15   Temp: 98.7 F (37.1 C)     TempSrc: Oral     SpO2: 94% 96% 98% 97%   No intake or output data in the 24 hours ending 04/12/18 1225 There were no vitals filed for this visit. There is no height or weight on file to calculate BMI.  General:  Elderly, moderately obese WF, Well nourished, well developed, in no acute distress HEENT: normal Lymph: no adenopathy Neck: noJVD Endocrine:  No thryomegaly Vascular: No  carotid bruits; FA pulses 2+ bilaterally without bruits  Cardiac:  normal S1, S2; RRR; no murmur  Lungs:  bibasilar rales  Abd: soft, nontender, no hepatomegaly  Ext: no edema Musculoskeletal:  Right forearm ecchymosis s/p radial cath.  Skin: warm and dry  Neuro:  CNs 2-12 intact, no focal abnormalities noted Psych:  Normal affect    EKG:  The ECG that was done 04/12/18 was personally reviewed and demonstrates SR, 1st degree AVB.   Relevant CV Studies:  LHC 04/04/18  Procedures   Coronary/Graft Acute MI Revascularization  LEFT HEART CATH AND CORONARY ANGIOGRAPHY  Conclusion     Dist RCA lesion is 95% stenosed. This occurs just before a high bifurcation of the large posterior lateral artery and posterior descending artery.  LV end diastolic pressure is severely elevated. LVEDP 30 mm Hg.  There is no aortic valve stenosis.  Ost LAD to Prox LAD lesion is 100% stenosed.  A drug-eluting stent was successfully placed using a STENT SYNERGY DES 3X38.  Post intervention, there is a 0% residual stenosis.  Mid LAD lesion is 75% stenosed, distal to the stented area.  Mid LM to Dist LM lesion is 30% stenosed.   Complex multivessel disease.  Given her age and elevated LVEDP, we did not think she would be a good candidate for emergency surgery.  Therefore, we stented the LAD to restore TIMI-3 flow.  There is still residual distal left main disease and mid LAD disease which was not treated today.  There is complex bifurcation disease on the right.  The plan will be to let her recover over the next 4 weeks, on dual antiplatelet therapy.  Then we can consider whether she would be a candidate for bypass surgery after that.     2D Echo 04/05/18 Study Conclusions  - Left ventricle: The cavity size was  normal. Wall thickness was   increased in a pattern of mild LVH. Systolic function was mildly   to moderately reduced. The estimated ejection fraction was in the   range of 40% to 45%.  Anterior, anteroseptal, apical and   inferoapical akinesis suggestive of LAD territory infarct. No   apical thrombus by Definity contrast. The study is not   technically sufficient to allow evaluation of LV diastolic   function. - Aortic valve: Sclerosis without stenosis. There was mild   regurgitation. - Mitral valve: Mildly thickened leaflets . There was trivial   regurgitation. - Left atrium: The atrium was normal in size. - Inferior vena cava: The vessel was dilated. The respirophasic   diameter changes were blunted (< 50%), consistent with elevated   central venous pressure.  Impressions:  - LVEF 40-45%, mild LVH, LAD territory infarct, no apical thrombus,   mild AI, trivial MR, normal LA size, dilated IVC.  Laboratory Data:  Chemistry Recent Labs  Lab 04/07/18 0238 04/12/18 1009  NA 137 138  K 4.0 4.0  CL 102 108  CO2 27 21*  GLUCOSE 129* 127*  BUN 32* 31*  CREATININE 0.91 1.03*  CALCIUM 8.7* 8.9  GFRNONAA 58* 50*  GFRAA >60 57*  ANIONGAP 8 9    No results for input(s): PROT, ALBUMIN, AST, ALT, ALKPHOS, BILITOT in the last 168 hours. Hematology Recent Labs  Lab 04/12/18 1009  WBC 13.3*  RBC 3.65*  HGB 11.4*  HCT 37.1  MCV 101.6*  MCH 31.2  MCHC 30.7  RDW 13.5  PLT 226   Cardiac EnzymesNo results for input(s): TROPONINI in the last 168 hours.  Recent Labs  Lab 04/12/18 1017  TROPIPOC 0.76*    BNP Recent Labs  Lab 04/12/18 1009  BNP 1,045.0*    DDimer No results for input(s): DDIMER in the last 168 hours.  Radiology/Studies:  Dg Chest Portable 1 View  Result Date: 04/12/2018 CLINICAL DATA:  Shortness of breath and chest heaviness. EXAM: PORTABLE CHEST 1 VIEW COMPARISON:  Chest CT 08/10/2011 FINDINGS: Enlarged heart. Calcific atherosclerotic disease of the aorta. Mediastinal contours appear intact. There is no evidence of pleural effusion or pneumothorax. Low lung volumes. Left lower lobe peribronchial airspace consolidation versus  atelectasis. Osseous structures are without acute abnormality. Soft tissues are grossly normal. IMPRESSION: Enlarged heart with calcific atherosclerotic disease of the aorta. Left lower lobe peribronchial airspace consolidation versus atelectasis. Electronically Signed   By: Ted Mcalpine M.D.   On: 04/12/2018 12:05    Assessment and Plan:   [name] is a 84 y.o. female with CAD s/p recent anterior wall STEMI 04/04/18 w/ cath showing severe 2V disease, treated with emergent PCI of 100% occluded pLCx with plans for stage RCA PCI for 95% distal stenosis, systolic HF w/ EF of 40% and atrial fibrillation, presenting to ED with SOB secondary to acute CHF.   1. Acute Systolic HF: pt presenting w/ s/s c/w acute CHF following recent admission for anterior MI. No acute EKG changes. BNP elevated at 1,400. CXR shows CHF cephalization and curley B lines. Echo 04/05/18 showed EF of 40-45%. Will repeat echo to reassess LVEF and to r/o ischemic MR. She is feeling better after dose of IV Lasix. Will continue. Plan to treat w/ 20 mg IV lasix daily. Will order strict I/Os, daily weights. Monitor renal function and electrolytes. Low salt diet. Will also start ARB, Cozaar 25 mg daily. Continue  blocker therapy w/ metoprolol.   2. CAD: s/p recent anterior  wall STEMI 04/04/18 w/ cath showing severe 2V disease, treated with emergent PCI of 100% occluded pLCx with plans for stage RCA PCI for 95% distal stenosis. Troponin previous admission was > 65K. TOC troponin today shows level has trended down to 0.76. EKG shows no acute ST changes. Will tentatively put on for right heart cath and PCI/Stent of RCA on Monday. Continue ASA, Brilinta, Lipitor and Metoprolol. Adding Cozaar.   3. PAF: new onset afib noted recent admission. Amiodarone was added. Will plan to taper amiodarone from 400mg  BID x2 weeks, then 200mg  BID x2 weeks, then 200mg  daily.  Per d/c summary from last admit, given the episode of afib was brief,  decision was made not to anticoagulate. We will monitor on tele.    For questions or updates, please contact [facility] Please consult [email or web address] for contact info under        Signed, Robbie Lis, PA-C  04/12/2018 12:25 PM &{  Patient examined chart reviewed. Admitting for new onset CHF post anterior MI 04/04/18 with residual RCA disease No signs of acute stent thrombosis troponins much lower than recent admit and no acute ECG changes. Lasix 20 mg iv just given in ER start ACE continue DAT. TTE to reassess EF and r/o mechanical complication ischemic MR Likely consider staged RCA intervention Monday.    See separate progress note from today   [employer]

## 2018-04-12 NOTE — ED Provider Notes (Signed)
[facility] EMERGENCY DEPARTMENT Provider Note   CSN: [id number] Arrival date & time: 04/12/18  4098     History   Chief Complaint Chief Complaint  Patient presents with  . Shortness of Breath    HPI [name] is a 84 y.o. female.  The history is provided by the patient.  Chest Pain   This is a new problem. The current episode started 6 to 12 hours ago. The problem occurs constantly. The problem has been gradually improving. The pain is present in the substernal region. The pain is at a severity of 3/10. The pain is mild. The quality of the pain is described as pressure-like. The pain does not radiate. Associated symptoms include shortness of breath. Pertinent negatives include no abdominal pain, no back pain, no cough, no dizziness, no exertional chest pressure, no fever, no hemoptysis, no leg pain, no lower extremity edema, no malaise/fatigue, no nausea, no near-syncope, no numbness, no orthopnea, no palpitations and no vomiting. She has tried rest for the symptoms. The treatment provided mild relief.  Her past medical history is significant for CAD and hyperlipidemia.  Pertinent negatives for past medical history include no seizures.  Procedure history is positive for cardiac catheterization (stent last week).    Past Medical History:  Diagnosis Date  . Acute systolic heart failure (HCC)   . Hyperlipidemia   . PAF (paroxysmal atrial fibrillation) (HCC)   . STEMI (ST elevation myocardial infarction) (HCC)    04/04/18 PCI/DES to the pLAD, 95% dRCA (possible staged intervention), 75% mLAD, EF 45%    Patient Active Problem List   Diagnosis Date Noted  . Acute CHF (congestive heart failure) (HCC) 04/12/2018  . Acute on chronic combined systolic and diastolic congestive heart failure (HCC) 04/12/2018  . PAF (paroxysmal atrial fibrillation) (HCC) 04/08/2018  . Acute systolic heart failure (HCC) 04/08/2018  . Hyperlipidemia 04/08/2018  . Acute anterior  wall MI (HCC) 04/04/2018  . Cholelithiasis with acute or chronic cholecystitis 08/10/2011  . Obesity (BMI 30.0-34.9) 08/10/2011    Past Surgical History:  Procedure Laterality Date  . CHOLECYSTECTOMY    . CHOLECYSTECTOMY  08/09/2011   Procedure: LAPAROSCOPIC CHOLECYSTECTOMY;  Surgeon: Robyne Askew, MD;  Location: [facility] ORS;  Service: General;  Laterality: N/A;  . CORONARY/GRAFT ACUTE MI REVASCULARIZATION N/A 04/04/2018   Procedure: Coronary/Graft Acute MI Revascularization;  Surgeon: Corky Crafts, MD;  Location: [facility] INVASIVE CV LAB;  Service: Cardiovascular;  Laterality: N/A;  . HERNIA REPAIR    . LEFT HEART CATH AND CORONARY ANGIOGRAPHY N/A 04/04/2018   Procedure: LEFT HEART CATH AND CORONARY ANGIOGRAPHY;  Surgeon: Corky Crafts, MD;  Location: [facility] INVASIVE CV LAB;  Service: Cardiovascular;  Laterality: N/A;  . TUBAL LIGATION       OB History   None      Home Medications    Prior to Admission medications   Medication Sig Start Date End Date Taking? Authorizing Provider  amiodarone (PACERONE) 200 MG tablet Take 400mg  (2 tabs) twice a day for 2 weeks, then 200mg  (1 tab) twice a day for 2 weeks, then 200mg  daily. 04/08/18  Yes Arty Baumgartner, NP  APPLE CIDER VINEGAR PO Take 1 tablet by mouth daily.   Yes [provider]  aspirin 81 MG chewable tablet Chew 1 tablet (81 mg total) by mouth daily. 04/08/18  Yes Arty Baumgartner, NP  atorvastatin (LIPITOR) 80 MG tablet Take 1 tablet (80 mg total) by mouth daily at 6 PM. 04/08/18  Yes Arty Baumgartner, NP  Boswellia Serrata (BOSWELLIA PO) Take 1 tablet by mouth daily.   Yes [provider]  Cholecalciferol (VITAMIN D-3) 5000 UNITS TABS Take 5,000 Units by mouth daily.    Yes [provider]  ferrous sulfate 325 (65 FE) MG tablet Take 325 mg by mouth daily with breakfast.   Yes [provider]  GLUCOSAMINE HCL PO Take 1 tablet by mouth daily.   Yes [provider]  isosorbide  mononitrate (IMDUR) 30 MG 24 hr tablet Take 1 tablet (30 mg total) by mouth daily. 04/08/18  Yes Laverda Page B, NP  Magnesium Carbonate (MAGONATE PO) Take 1 tablet by mouth daily.   Yes [provider]  metoprolol succinate (TOPROL XL) 25 MG 24 hr tablet Take 1 tablet (25 mg total) by mouth daily. 04/08/18  Yes Arty Baumgartner, NP  Misc Natural Products (GREEN TEA) TABS Take 1 tablet by mouth daily.   Yes [provider]  Misc Natural Products (LUTEIN 20 PO) Take 1 tablet by mouth daily.   Yes [provider]  Multiple Vitamin (MULTIVITAMIN) capsule Take 1 capsule by mouth daily.   Yes [provider]  nitroGLYCERIN (NITROSTAT) 0.4 MG SL tablet Place 1 tablet (0.4 mg total) under the tongue every 5 (five) minutes as needed for chest pain. 04/08/18  Yes Laverda Page B, NP  OIL OF OREGANO PO Take 1 tablet by mouth daily.   Yes [provider]  Omega-3 1000 MG CAPS Take 1,000 mg by mouth daily.    Yes [provider]  OVER THE COUNTER MEDICATION Lectin   Yes [provider]  SPIRULINA PO Take 500 mg by mouth daily.   Yes [provider]  ticagrelor (BRILINTA) 90 MG TABS tablet Take 1 tablet (90 mg total) by mouth 2 (two) times daily. 04/08/18  Yes Arty Baumgartner, NP    Family History Family History  Problem Relation Age of Onset  . Hypertension Mother     Social History Social History   Tobacco Use  . Smoking status: Never Smoker  . Smokeless tobacco: Never Used  Substance Use Topics  . Alcohol use: No  . Drug use: No     Allergies   Patient has no known allergies.   Review of Systems Review of Systems  Constitutional: Negative for chills, fever and malaise/fatigue.  HENT: Negative for ear pain and sore throat.   Eyes: Negative for pain and visual disturbance.  Respiratory: Positive for shortness of breath. Negative for cough and hemoptysis.   Cardiovascular: Positive for chest pain.  Negative for palpitations, orthopnea and near-syncope.  Gastrointestinal: Negative for abdominal pain, nausea and vomiting.  Genitourinary: Negative for dysuria and hematuria.  Musculoskeletal: Negative for arthralgias and back pain.  Skin: Negative for color change and rash.  Neurological: Negative for dizziness, seizures, syncope and numbness.  All other systems reviewed and are negative.    Physical Exam Updated Vital Signs  ED Triage Vitals  Enc Vitals Group     BP 04/12/18 0952 138/80     Pulse Rate 04/12/18 0952 88     Resp 04/12/18 0952 20     Temp 04/12/18 0952 98.7 F (37.1 C)     Temp Source 04/12/18 0952 Oral     SpO2 04/12/18 0952 94 %     Weight --      Height --      Head Circumference --      Peak Flow --  Pain Score 04/12/18 0957 4     Pain Loc --      Pain Edu? --      Excl. in GC? --     Physical Exam  Constitutional: She is oriented to person, place, and time. She appears well-developed and well-nourished. No distress.  HENT:  Head: Normocephalic and atraumatic.  Mouth/Throat: Oropharynx is clear and moist.  Eyes: Pupils are equal, round, and reactive to light. Conjunctivae and EOM are normal.  Neck: Normal range of motion. Neck supple.  Cardiovascular: Normal rate, regular rhythm, normal heart sounds and intact distal pulses.  No murmur heard. Pulmonary/Chest: Effort normal and breath sounds normal. No respiratory distress. She has no decreased breath sounds.  Abdominal: Soft. There is no tenderness.  Musculoskeletal: Normal range of motion. She exhibits no edema.       Right lower leg: She exhibits no edema.       Left lower leg: She exhibits no edema.  Neurological: She is alert and oriented to person, place, and time.  Skin: Skin is warm and dry. Capillary refill takes less than 2 seconds.  Psychiatric: She has a normal mood and affect.  Nursing note and vitals reviewed.    ED Treatments / Results  Labs (all labs ordered are listed,  but only abnormal results are displayed) Labs Reviewed  BASIC METABOLIC PANEL - Abnormal; Notable for the following components:      Result Value   CO2 21 (*)    Glucose, Bld 127 (*)    BUN 31 (*)    Creatinine, Ser 1.03 (*)    GFR calc non Af Amer 50 (*)    GFR calc Af Amer 57 (*)    All other components within normal limits  CBC - Abnormal; Notable for the following components:   WBC 13.3 (*)    RBC 3.65 (*)    Hemoglobin 11.4 (*)    MCV 101.6 (*)    All other components within normal limits  BRAIN NATRIURETIC PEPTIDE - Abnormal; Notable for the following components:   B Natriuretic Peptide 1,045.0 (*)    All other components within normal limits  I-STAT TROPONIN, ED - Abnormal; Notable for the following components:   Troponin i, poc 0.76 (*)    All other components within normal limits    EKG EKG Interpretation  Date/Time:  Saturday April 12 2018 09:50:53 EDT Ventricular Rate:  82 PR Interval:  244 QRS Duration: 82 QT Interval:  386 QTC Calculation: 450 R Axis:   -54 Text Interpretation:  Sinus rhythm with 1st degree A-V block Left axis deviation Abnormal ECG Anterior st changes improved Confirmed by Virgina Norfolk [phone]) on 04/12/2018 9:58:22 AM Also confirmed by Virgina Norfolk [phone]), editor Josephine Igo ([id number])  on 04/12/2018 2:19:27 PM   Radiology Dg Chest Portable 1 View  Result Date: 04/12/2018 CLINICAL DATA:  Shortness of breath and chest heaviness. EXAM: PORTABLE CHEST 1 VIEW COMPARISON:  Chest CT 08/10/2011 FINDINGS: Enlarged heart. Calcific atherosclerotic disease of the aorta. Mediastinal contours appear intact. There is no evidence of pleural effusion or pneumothorax. Low lung volumes. Left lower lobe peribronchial airspace consolidation versus atelectasis. Osseous structures are without acute abnormality. Soft tissues are grossly normal. IMPRESSION: Enlarged heart with calcific atherosclerotic disease of the aorta. Left lower lobe peribronchial  airspace consolidation versus atelectasis. Electronically Signed   By: Ted Mcalpine M.D.   On: 04/12/2018 12:05    Procedures Procedures (including critical care time)  Medications Ordered in ED Medications  aspirin chewable tablet 81 mg (has no administration in time range)  amiodarone (PACERONE) tablet 400 mg (400 mg Oral Given 04/12/18 1441)  atorvastatin (LIPITOR) tablet 80 mg (has no administration in time range)  isosorbide mononitrate (IMDUR) 24 hr tablet 30 mg (30 mg Oral Given 04/12/18 1441)  metoprolol succinate (TOPROL-XL) 24 hr tablet 25 mg (has no administration in time range)  nitroGLYCERIN (NITROSTAT) SL tablet 0.4 mg (has no administration in time range)  ferrous sulfate tablet 325 mg (has no administration in time range)  ticagrelor (BRILINTA) tablet 90 mg (has no administration in time range)  cholecalciferol (VITAMIN D) tablet 5,000 Units (has no administration in time range)  sodium chloride flush (NS) 0.9 % injection 3 mL (3 mLs Intravenous Given 04/12/18 1442)  sodium chloride flush (NS) 0.9 % injection 3 mL (has no administration in time range)  0.9 %  sodium chloride infusion (has no administration in time range)  acetaminophen (TYLENOL) tablet 650 mg (650 mg Oral Given 04/12/18 1348)  furosemide (LASIX) injection 20 mg (20 mg Intravenous Not Given 04/12/18 1444)  losartan (COZAAR) tablet 25 mg (25 mg Oral Given 04/12/18 1449)  sodium chloride flush (NS) 0.9 % injection 3 mL (has no administration in time range)  sodium chloride flush (NS) 0.9 % injection 3 mL (has no administration in time range)  0.9 %  sodium chloride infusion (has no administration in time range)  aspirin chewable tablet 81 mg (has no administration in time range)  0.9 %  sodium chloride infusion (has no administration in time range)  furosemide (LASIX) injection 40 mg (40 mg Intravenous Given 04/12/18 1141)     Initial Impression / Assessment and Plan / ED Course  I have reviewed  the triage vital signs and the nursing notes.  Pertinent labs & imaging results that were available during my care of the patient were reviewed by me and considered in my medical decision making (see chart for details).     [name] is an 84 year old female history of CAD with recent cardiac stent placed last week who presents to the ED with shortness of breath, chest pain.  Patient has been chest pain free and without shortness of breath since her cardiac catheterization and stent until earlier this morning.  She states some difficulty breathing and some left-sided chest pain.  EKG overall shows no acute ST changes.  Troponin is elevated and BNP is elevated and chest x-ray is showing signs of volume overload.  Suspect patient likely with heart failure exacerbation.  She did have a reduced EF seen during her catheterization.  She is not on any diuretics at this time.  Cardiology was consulted and they recommend IV Lasix and admission for further work-up and care.  She supposedly is a candidate for likely CABG in the future. Pt with heart failure symptoms today. Will need trop trended, possibly new ACS. Patient remained hemodynamically stable throughout my care and admitted in stable condition.  Final Clinical Impressions(s) / ED Diagnoses   Final diagnoses:  Acute on chronic congestive heart failure, unspecified heart failure type [facility])    ED Discharge Orders    None       Virgina Norfolk, DO 04/12/18 1626

## 2018-04-13 ENCOUNTER — Inpatient Hospital Stay (HOSPITAL_COMMUNITY): Payer: PPO

## 2018-04-13 DIAGNOSIS — I340 Nonrheumatic mitral (valve) insufficiency: Secondary | ICD-10-CM

## 2018-04-13 DIAGNOSIS — I351 Nonrheumatic aortic (valve) insufficiency: Secondary | ICD-10-CM

## 2018-04-13 DIAGNOSIS — I5043 Acute on chronic combined systolic (congestive) and diastolic (congestive) heart failure: Principal | ICD-10-CM

## 2018-04-13 LAB — ECHOCARDIOGRAM COMPLETE
HEIGHTINCHES: 64 in
Weight: 3288 oz

## 2018-04-13 LAB — BASIC METABOLIC PANEL
ANION GAP: 9 (ref 5–15)
BUN: 29 mg/dL — ABNORMAL HIGH (ref 8–23)
CHLORIDE: 105 mmol/L (ref 98–111)
CO2: 26 mmol/L (ref 22–32)
CREATININE: 0.9 mg/dL (ref 0.44–1.00)
Calcium: 8.9 mg/dL (ref 8.9–10.3)
GFR calc Af Amer: >60 mL/min (ref >60)
GFR calc non Af Amer: 58 mL/min — ABNORMAL LOW (ref >60)
GLUCOSE: 114 mg/dL — AB (ref 70–99)
POTASSIUM: 4.1 mmol/L (ref 3.5–5.1)
Sodium: 140 mmol/L (ref 135–145)

## 2018-04-13 MED ORDER — FERROUS SULFATE 325 (65 FE) MG PO TABS
325.0000 mg | ORAL_TABLET | Freq: Every day | ORAL | Status: DC
  Administered 2018-04-13 – 2018-04-16 (×4): 325 mg via ORAL
  Filled 2018-04-13 (×4): qty 1

## 2018-04-13 MED ORDER — PERFLUTREN LIPID MICROSPHERE
1.0000 mL | INTRAVENOUS | Status: AC | PRN
  Administered 2018-04-13: 3 mL via INTRAVENOUS
  Filled 2018-04-13: qty 10

## 2018-04-13 NOTE — Progress Notes (Signed)
  Echocardiogram 2D Echocardiogram has been performed with Definity.  Gerda Diss 04/13/2018, 4:16 PM

## 2018-04-13 NOTE — Evaluation (Signed)
Physical Therapy Evaluation Patient Details Name: [name] MRN: [id number] DOB: 1933/08/06 Today's Date: 04/13/2018   History of Present Illness  [name] is a 84 y.o. female with CAD s/p recent anterior wall STEMI 04/04/18 w/ cath showing severe 2V disease, treated with emergent PCI of 100% occluded pLCx with plans for stage RCA PCI for 95% distal stenosis, systolic HF w/ EF of 40% and atrial fibrillation, presenting to ED with SOB secondary to acute CHF.   Clinical Impression  Pt admitted with above diagnosis. Pt currently with functional limitations due to the deficits listed below (see PT Problem List). Independent with RW in her home, prior to this admission, and she did reveal that at times she pushes an office chair for support with walking in her home; Presents with decr functional mobility, decr activity tolerance; Pt will benefit from skilled PT to increase their independence and safety with mobility to allow discharge to the venue listed below.    Will place OT consult for ADLs per protocol.     Follow Up Recommendations Home health PT;Supervision/Assistance - 24 hour(HHRN as well for chronic disease management)    Equipment Recommendations  Rolling walker with 5" wheels;3in1 (PT)    Recommendations for Other Services OT consult(low vision)     Precautions / Restrictions Precautions Precaution Comments: Fall risk very slight      Mobility  Bed Mobility Overal bed mobility: Needs Assistance Bed Mobility: Supine to Sit     Supine to sit: Supervision     General bed mobility comments: Slow moving, but no physical assist  Transfers Overall transfer level: Needs assistance Equipment used: Rolling walker (2 wheeled) Transfers: Sit to/from Stand Sit to Stand: Min guard(without physical contact)         General transfer comment: Minguard for safety; cues for control of descent to sit  Ambulation/Gait Ambulation/Gait assistance: Min guard(without  physical contact) Gait Distance (Feet): 75 Feet Assistive device: Rolling walker (2 wheeled) Gait Pattern/deviations: Step-through pattern;Decreased step length - right;Decreased step length - left;Decreased stride length     General Gait Details: Cues to self-monitor for activity tolerance  Stairs            Wheelchair Mobility    Modified Rankin (Stroke Patients Only)       Balance Overall balance assessment: Needs assistance   Sitting balance-Leahy Scale: Fair       Standing balance-Leahy Scale: Fair                               Pertinent Vitals/Pain Pain Assessment: Faces Faces Pain Scale: Hurts a little bit Pain Location: Low back pain; Soreness RUE, with noted bruising; pt reports this is post cardiac Cath Pain Descriptors / Indicators: Sore Pain Intervention(s): Monitored during session    Home Living Family/patient expects to be discharged to:: Private residence Living Arrangements: Spouse/significant other Available Help at Discharge: Family;Available 24 hours/day Type of Home: House Home Access: Stairs to enter Entrance Stairs-Rails: None Entrance Stairs-Number of Steps: 2(reports not difficulty with steps to enter home) Home Layout: Two level;Other (Comment)(Has a stair lift) Home Equipment: Walker - 2 wheels      Prior Function Level of Independence: Independent with assistive device(s)         Comments: Has a RW, not sure how much she uses it; Tells me she walks and tends to push an office chair for support in her home     Hand  Dominance        Extremity/Trunk Assessment   Upper Extremity Assessment Upper Extremity Assessment: Overall WFL for tasks assessed(for simple tasks; noted bruising RUE and some soreness)    Lower Extremity Assessment Lower Extremity Assessment: Generalized weakness       Communication   Communication: No difficulties  Cognition Arousal/Alertness: Awake/alert Behavior During Therapy: WFL  for tasks assessed/performed Overall Cognitive Status: Within Functional Limits for tasks assessed                                        General Comments General comments (skin integrity, edema, etc.): Walked on Room Air, and noted O2 sats remained 92-94% throughout session; Noted decr vision as well    Exercises     Assessment/Plan    PT Assessment Patient needs continued PT services  PT Problem List Decreased activity tolerance;Decreased balance;Decreased mobility       PT Treatment Interventions DME instruction;Gait training;Stair training;Functional mobility training;Therapeutic activities;Therapeutic exercise;Balance training;Patient/family education    PT Goals (Current goals can be found in the Care Plan section)  Acute Rehab PT Goals Patient Stated Goal: Tells me she would like to avoid needing a Cardiac Cath; hopes to be home soon PT Goal Formulation: With patient Time For Goal Achievement: 04/27/18 Potential to Achieve Goals: Good    Frequency Min 3X/week   Barriers to discharge        Co-evaluation               AM-PAC PT "6 Clicks" Daily Activity  Outcome Measure Difficulty turning over in bed (including adjusting bedclothes, sheets and blankets)?: A Little Difficulty moving from lying on back to sitting on the side of the bed? : A Little Difficulty sitting down on and standing up from a chair with arms (e.g., wheelchair, bedside commode, etc,.)?: A Little Help needed moving to and from a bed to chair (including a wheelchair)?: None Help needed walking in hospital room?: A Little Help needed climbing 3-5 steps with a railing? : A Little 6 Click Score: 19    End of Session Equipment Utilized During Treatment: Gait belt Activity Tolerance: Patient tolerated treatment well Patient left: in chair;with call bell/phone within reach;with chair alarm set Nurse Communication: Mobility status PT Visit Diagnosis: Other abnormalities of gait  and mobility (R26.89);Unsteadiness on feet (R26.81)    Time: 0981-1914 PT Time Calculation (min) (ACUTE ONLY): 30 min   Charges:   PT Evaluation $PT Eval Low Complexity: 1 Low PT Treatments $Gait Training: 8-22 mins        Van Clines, PT  Acute Rehabilitation Services Pager [phone] Office [phone]   Levi Aland 04/13/2018, 9:54 AM

## 2018-04-13 NOTE — Progress Notes (Signed)
Progress Note  Patient Name: [name] Date of Encounter: 04/13/2018  Primary Cardiologist: Lance Muss, MD    Subjective   Feels much better no chest pain no dyspnea RUE still sore from previous intervention   Inpatient Medications    Scheduled Meds: . amiodarone  400 mg Oral BID  . aspirin  81 mg Oral Daily  . [START ON 04/14/2018] aspirin  81 mg Oral Pre-Cath  . atorvastatin  80 mg Oral q1800  . cholecalciferol  5,000 Units Oral Daily  . ferrous sulfate  325 mg Oral Q breakfast  . furosemide  20 mg Intravenous Daily  . isosorbide mononitrate  30 mg Oral Daily  . losartan  25 mg Oral Daily  . metoprolol succinate  25 mg Oral Daily  . sodium chloride flush  3 mL Intravenous Q12H  . [START ON 04/14/2018] sodium chloride flush  3 mL Intravenous Q12H  . ticagrelor  90 mg Oral BID   Continuous Infusions: . sodium chloride    . [START ON 04/14/2018] sodium chloride    . [START ON 04/14/2018] sodium chloride     PRN Meds: sodium chloride, [START ON 04/14/2018] sodium chloride, acetaminophen, nitroGLYCERIN, sodium chloride flush, [START ON 04/14/2018] sodium chloride flush   Vital Signs    Vitals:   04/12/18 1859 04/12/18 2029 04/13/18 0626 04/13/18 0847  BP: 115/62 117/66 136/76 133/73  Pulse: 84 82 84 (!) 101  Resp: 18 18 18    Temp: 98 F (36.7 C) 98.6 F (37 C) 98.4 F (36.9 C)   TempSrc: Oral Oral Oral   SpO2: 96% 98% 96%   Weight:   93.2 kg   Height:        Intake/Output Summary (Last 24 hours) at 04/13/2018 1030 Last data filed at 04/13/2018 0300 Gross per 24 hour  Intake 483 ml  Output 1975 ml  Net -1492 ml   Filed Weights   04/12/18 1303 04/13/18 0626  Weight: 94.3 kg 93.2 kg    Telemetry    NSR 04/13/2018  - Personally Reviewed  ECG    SR LAD PR 51 old anterior MI no acute changes  - Personally Reviewed  Physical Exam  Elderly white female  GEN: No acute distress.   Neck: No JVD Cardiac: RRR, no murmurs, rubs, or gallops.   Respiratory: Clear to auscultation bilaterally. GI: Soft, nontender, non-distended  MS: No edema; No deformity. Neuro:  Nonfocal  Psych: Normal affect  Large ecchymosis RUE form cath sight   Labs    Chemistry Recent Labs  Lab 04/07/18 0238 04/12/18 1009 04/13/18 0525  NA 137 138 140  K 4.0 4.0 4.1  CL 102 108 105  CO2 27 21* 26  GLUCOSE 129* 127* 114*  BUN 32* 31* 29*  CREATININE 0.91 1.03* 0.90  CALCIUM 8.7* 8.9 8.9  GFRNONAA 58* 50* 58*  GFRAA >60 57* >60  ANIONGAP 8 9 9      Hematology Recent Labs  Lab 04/12/18 1009  WBC 13.3*  RBC 3.65*  HGB 11.4*  HCT 37.1  MCV 101.6*  MCH 31.2  MCHC 30.7  RDW 13.5  PLT 226    Cardiac EnzymesNo results for input(s): TROPONINI in the last 168 hours.  Recent Labs  Lab 04/12/18 1017  TROPIPOC 0.76*     BNP Recent Labs  Lab 04/12/18 1009  BNP 1,045.0*     DDimer No results for input(s): DDIMER in the last 168 hours.   Radiology    Dg Chest Portable 1  View  Result Date: 04/12/2018 CLINICAL DATA:  Shortness of breath and chest heaviness. EXAM: PORTABLE CHEST 1 VIEW COMPARISON:  Chest CT 08/10/2011 FINDINGS: Enlarged heart. Calcific atherosclerotic disease of the aorta. Mediastinal contours appear intact. There is no evidence of pleural effusion or pneumothorax. Low lung volumes. Left lower lobe peribronchial airspace consolidation versus atelectasis. Osseous structures are without acute abnormality. Soft tissues are grossly normal. IMPRESSION: Enlarged heart with calcific atherosclerotic disease of the aorta. Left lower lobe peribronchial airspace consolidation versus atelectasis. Electronically Signed   By: Ted Mcalpine M.D.   On: 04/12/2018 12:05    Cardiac Studies   04/04/18 EF 40-45% trivial MR   Patient Profile     84 y.o. female CAD stent to LAD for STEMI 04/04/18 with residual RCA disease planned Staged intervention admitted with CHF   Assessment & Plan   1. Acute Systolic HF: pt presenting  w/ s/s c/w acute CHF following recent admission for anterior MI. No acute EKG changes. BNP elevated at 1,400. CXR shows CHF cephalization and curley B lines. Echo 04/05/18 showed EF of 40-45%. Will repeat echo to reassess LVEF and to r/o ischemic MR. She is feeling better after dose of IV Lasix. Will continue. Plan to treat w/ 20 mg IV lasix daily. Will order strict I/Os, daily weights. Monitor renal function and electrolytes. Low salt diet. Will also start ARB, Cozaar 25 mg daily. Continue ? blocker therapy w/ metoprolol.   2. CAD: s/p recent anterior wall STEMI 04/04/18 w/ cath showing severe 2V disease, treated with emergent PCI of 100% occluded pLCx with plans for stage RCA PCI for 95% distal stenosis. Troponin previous admission was > 65K. TOC troponin today shows level has trended down to 0.76. EKG shows no acute ST changes. Will tentatively put on for right heart cath and PCI/Stent of RCA on Monday. Continue ASA, Brilinta, Lipitor and Metoprolol. Adding Cozaar.   3. PAF: new onset afib noted recent admission. Amiodarone was added. Will plan to taper amiodarone from 400mg  BID x2 weeks, then 200mg  BID x2 weeks, then 200mg  daily. Per d/c summary from last admit, given the episode of afib was brief, decision was made not to anticoagulate. We will monitor on tele.   Would not use right radial for RCA intervention if done this admission due to bruising   For questions or updates, please contact [facility] Please consult [email or web address] for contact info under        Signed, Charlton Haws, MD  04/13/2018, 10:30 AM

## 2018-04-14 ENCOUNTER — Other Ambulatory Visit: Payer: Self-pay

## 2018-04-14 ENCOUNTER — Encounter (HOSPITAL_COMMUNITY)
Admission: EM | Disposition: A | Discharge status: Home Health | Payer: Self-pay | Source: Home / Self Care | Attending: Cardiovascular Disease

## 2018-04-14 LAB — BASIC METABOLIC PANEL
ANION GAP: 7 (ref 5–15)
BUN: 24 mg/dL — ABNORMAL HIGH (ref 8–23)
CALCIUM: 8.6 mg/dL — AB (ref 8.9–10.3)
CO2: 23 mmol/L (ref 22–32)
Chloride: 108 mmol/L (ref 98–111)
Creatinine, Ser: 0.83 mg/dL (ref 0.44–1.00)
GFR calc non Af Amer: >60 mL/min (ref >60)
Glucose, Bld: 115 mg/dL — ABNORMAL HIGH (ref 70–99)
POTASSIUM: 3.8 mmol/L (ref 3.5–5.1)
Sodium: 138 mmol/L (ref 135–145)

## 2018-04-14 SURGERY — RIGHT/LEFT HEART CATH AND CORONARY ANGIOGRAPHY
Anesthesia: LOCAL

## 2018-04-14 MED ORDER — SPIRONOLACTONE 12.5 MG HALF TABLET
12.5000 mg | ORAL_TABLET | Freq: Every day | ORAL | Status: DC
  Administered 2018-04-14 – 2018-04-16 (×3): 12.5 mg via ORAL
  Filled 2018-04-14 (×3): qty 1

## 2018-04-14 MED ORDER — CLOPIDOGREL BISULFATE 75 MG PO TABS
300.0000 mg | ORAL_TABLET | Freq: Once | ORAL | Status: AC
  Administered 2018-04-15: 300 mg via ORAL
  Filled 2018-04-14: qty 4

## 2018-04-14 MED ORDER — APIXABAN 5 MG PO TABS
5.0000 mg | ORAL_TABLET | Freq: Two times a day (BID) | ORAL | Status: DC
  Administered 2018-04-14 – 2018-04-16 (×4): 5 mg via ORAL
  Filled 2018-04-14 (×4): qty 1

## 2018-04-14 MED ORDER — CLOPIDOGREL BISULFATE 75 MG PO TABS
75.0000 mg | ORAL_TABLET | Freq: Every day | ORAL | Status: DC
  Administered 2018-04-16: 75 mg via ORAL
  Filled 2018-04-14: qty 1

## 2018-04-14 MED ORDER — FUROSEMIDE 10 MG/ML IJ SOLN
20.0000 mg | Freq: Two times a day (BID) | INTRAMUSCULAR | Status: DC
  Administered 2018-04-14: 20 mg via INTRAVENOUS
  Filled 2018-04-14: qty 2

## 2018-04-14 NOTE — Progress Notes (Addendum)
Progress Note  Patient Name: [name] Date of Encounter: 04/14/2018  Primary Cardiologist: Lance Muss, MD   Subjective   No recurrent chest pain like MI presentation. Breathing slow gradually improving. endorse high salt intake.   Inpatient Medications    Scheduled Meds: . amiodarone  400 mg Oral BID  . aspirin  81 mg Oral Daily  . atorvastatin  80 mg Oral q1800  . cholecalciferol  5,000 Units Oral Daily  . ferrous sulfate  325 mg Oral Q breakfast  . furosemide  20 mg Intravenous Daily  . isosorbide mononitrate  30 mg Oral Daily  . losartan  25 mg Oral Daily  . metoprolol succinate  25 mg Oral Daily  . sodium chloride flush  3 mL Intravenous Q12H  . sodium chloride flush  3 mL Intravenous Q12H  . spironolactone  12.5 mg Oral Daily  . ticagrelor  90 mg Oral BID   Continuous Infusions: . sodium chloride    . sodium chloride     PRN Meds: sodium chloride, sodium chloride, acetaminophen, nitroGLYCERIN, sodium chloride flush, sodium chloride flush   Vital Signs    Vitals:   04/13/18 1843 04/13/18 2019 04/14/18 0051 04/14/18 0433  BP: (!) 107/55 117/71 126/72 136/73  Pulse: 93 (!) 111 84 81  Resp: 18 16 19 18   Temp:  98.8 F (37.1 C) 98.4 F (36.9 C) 98 F (36.7 C)  TempSrc:  Oral Oral Oral  SpO2: 92% (!) 89% 94% 95%  Weight:    94.1 kg  Height:        Intake/Output Summary (Last 24 hours) at 04/14/2018 0945 Last data filed at 04/14/2018 0659 Gross per 24 hour  Intake 356.85 ml  Output 500 ml  Net -143.15 ml   Filed Weights   04/12/18 1303 04/13/18 0626 04/14/18 0433  Weight: 94.3 kg 93.2 kg 94.1 kg    Telemetry    SR at rate of 60-70s - Personally Reviewed  ECG    N/A  Physical Exam   GEN: No acute distress.   Neck: No JVD Cardiac: RRR, no murmurs, rubs, or gallops.  Respiratory: Faint rales GI: Soft, nontender, non-distended  MS: trace edema; No deformity. Neuro:  Nonfocal  Psych: Normal affect   Labs     Chemistry Recent Labs  Lab 04/12/18 1009 04/13/18 0525 04/14/18 0531  NA 138 140 138  K 4.0 4.1 3.8  CL 108 105 108  CO2 21* 26 23  GLUCOSE 127* 114* 115*  BUN 31* 29* 24*  CREATININE 1.03* 0.90 0.83  CALCIUM 8.9 8.9 8.6*  GFRNONAA 50* 58* >60  GFRAA 57* >60 >60  ANIONGAP 9 9 7      Hematology Recent Labs  Lab 04/12/18 1009  WBC 13.3*  RBC 3.65*  HGB 11.4*  HCT 37.1  MCV 101.6*  MCH 31.2  MCHC 30.7  RDW 13.5  PLT 226    Cardiac EnzymesNo results for input(s): TROPONINI in the last 168 hours.  Recent Labs  Lab 04/12/18 1017  TROPIPOC 0.76*     BNP Recent Labs  Lab 04/12/18 1009  BNP 1,045.0*     DDimer No results for input(s): DDIMER in the last 168 hours.   Radiology    Dg Chest Portable 1 View  Result Date: 04/12/2018 CLINICAL DATA:  Shortness of breath and chest heaviness. EXAM: PORTABLE CHEST 1 VIEW COMPARISON:  Chest CT 08/10/2011 FINDINGS: Enlarged heart. Calcific atherosclerotic disease of the aorta. Mediastinal contours appear intact. There is no evidence  of pleural effusion or pneumothorax. Low lung volumes. Left lower lobe peribronchial airspace consolidation versus atelectasis. Osseous structures are without acute abnormality. Soft tissues are grossly normal. IMPRESSION: Enlarged heart with calcific atherosclerotic disease of the aorta. Left lower lobe peribronchial airspace consolidation versus atelectasis. Electronically Signed   By: Ted Mcalpine M.D.   On: 04/12/2018 12:05    Cardiac Studies   Echo 04/13/18 Study Conclusions  - Left ventricle: The cavity size was normal. Systolic function was   moderately to severely reduced. The estimated ejection fraction   was in the range of 30% to 35%. Wall motion was normal; there   were no regional wall motion abnormalities. - Aortic valve: There was mild regurgitation. - Mitral valve: There was mild regurgitation. - Right atrium: The atrium was normal in size. - Pulmonary arteries:  Systolic pressure was within the normal   range.  Impressions:  - There is akinesis of the basal and mid inferoseptal,   anteroseptal, anterior and apical septal and anterior walls.   There is no definitive thrombus but heavy smoke consistent with   pre-thrombotic state. Anticoagulation is recommended.  Patient Profile     [name] is a 84 y.o. female with CAD s/p recent anterior wall STEMI 04/04/18 w/ cath showing severe 2V disease, treated with emergent PCI of 100% occluded pLCx with plans for stage RCA PCI for 95% distal stenosis, systolic HF w/ EF of 40% and atrial fibrillation, presenting to ED with SOB secondary to acute CHF.   Assessment & Plan    1. Acute on chronic systolic CHF - BNP elevated at 1,400.CXRshowsCHF cephalization and curley B lines. - Echo this admission showed LVEF of 30-35% which slightly lower than LVEF of 40-45% on 04/05/18.  Low EF due to ischemia vs thrombus or just readers margin.  - Hold L & R cath today given early thrombus formation. Will discuss timing to staged PCI with MD.  - Breathing slowly improving.  - Net I & O negative 1.7 L.  - Continue IV lasix 20mg  daily. Might able to switch to PO tomorrow.  - Continue Toprol XL 25mg  and Cozaar 25mg  daily.  - Add low dose spironolactone today - May consider Entresto prior to discharge. Renal function stable.  - She eats excess salt at home, this is most culprit etiology of this presentation. Will need heart failure education with SSI lasix at discharge.   2. CAD s/p PCI of pLCx  - Hold staged PCI today as above  - no chest pain like prior admission  - Continue ASA, Brillinta, statin and BB  3. ? Early thrombus formation - Will review anticoagulation with MD  4. PAF - Brief afib episode last admission did not warrant long term anticoagulation however now question rises for thrombus formation . Continue amiodarone. Will discuss anticoagulation with MD - heparin vs NOAC.  -CHADSVASC score of  5-6.   5. HLD - 04/04/2018: Cholesterol 230; HDL 50; LDL Cholesterol 126; Triglycerides 271; VLDL 54  - Continue high intensity statin. Repeat labs in few weeks.   For questions or updates, please contact [facility] Please consult [email or web address] for contact info under     SignedManson Passey, PA  04/14/2018, 9:45 AM    Patient seen, examined. Available data reviewed. Agree with findings, assessment, and plan as outlined by Chelsea Aus, PA.  The patient is independently interviewed and examined.  Her husband and her brother are at the bedside.  On exam, she is a bright, elderly  woman in no distress.  JVP is moderately elevated.  Lung fields are clear with the exception of fine rales in the bases, heart is regular rate and rhythm with a soft ejection murmur at the left upper sternal border, abdomen is soft and nontender, extremities demonstrate trace bilateral pretibial edema, the right arm has extensive ecchymosis.  I have personally reviewed the patient's cath films and echo study.  She has fairly complex coronary artery disease but has felt relatively well without any anginal symptoms since undergoing primary PCI of the LAD.  She has smoked in the left ventricle concerning for a "pre-thrombotic state" and oral anticoagulation has been recommended.  I discussed her case with Dr. Eldridge Dace and we specifically discussed revascularization options.  While CABG may be the safest treatment for her considering complex bifurcation disease of the RCA, she is not very interested in even considering CABG at this point.  Complex PCI versus medical therapy or other considerations.  At this time it seems reasonable to proceed with medical therapy, anticoagulate her at least in the short-term after a large myocardial infarction with LV smoke and previous incidence of atrial fibrillation during her last hospitalization.  We will change her from ticagrelor to clopidogrel to reduce bleeding risk.  We will  continue aspirin 81 mg for another 2 to 3 weeks to get her out to 1 month from her initial event.  After 30 days she will continue on clopidogrel and apixaban and follow-up as an outpatient with a repeat echocardiogram in 8 to 12 weeks.  With respect to her heart failure, she should be continued on IV diuresis.  She has been started on Spironolactone.  Will defer other specific medical recommendations to the advanced heart failure team.  Appreciate their consultation.  Tonny Bollman, M.D. 04/14/2018 2:13 PM

## 2018-04-14 NOTE — Consult Note (Signed)
   [facility] CM Inpatient Consult   04/14/2018  [name] 12/03/1933 [id number]  Patient had just become active with [facility] for chronic disease management services.  Patient has been engaged by a [facility].  Our community based plan of care has focused on disease management.  Per MD History and Physical notes:  Patient is a 84 y.o. female with CAD s/p recent anterior wall STEMI 04/04/18 w/ cath showing severe 2V disease, treated with emergent PCI of 100% occluded pLCx with plans for stage RCA PCI for 95% distal stenosis, systolic HF w/ EF of 40% and atrial fibrillation, presenting to ED with SOB secondary to acute CHF.  Patient is currently resting on rounds awaiting procedure.  Will continue to follow for progression and disposition needs.  Primary Care provider: Billie Ruddy, PA Pharmacy: [facility]   Of note, [facility] Care Management services does not replace or interfere with any services that are needed or arranged by inpatient case management or social work.  For additional questions or referrals please contact:  Charlesetta Shanks, RN BSN CCM [facility]  [phone] business mobile phone Toll free office [phone]

## 2018-04-14 NOTE — Consult Note (Deleted)
Advanced Heart Failure Team Consult Note   Primary Physician: Daylene Katayama, [place] PCP-Cardiologist:  Lance Muss, MD  Reason for Consultation: Acute systolic HF  HPI:    [name] is seen today for evaluation of Acute systolic HF at the request of Dr Eden Emms.  [name] is a 84 y.o. female with history of CAD with STEMI and PCI 04/04/2018, hyperlipidemia, PAF after STEMI (not on AC), and chronic systolic HF due to ICM (EF 30-35%).  Recently admitted 10/11-10/15/19 with STEMI. LHC with results below. Not thought to be a good emergent CABG candidate given age and elevated LVEDP. She had DES to LAD to restore TIMI-3 flow. She had residual distal left main disease and mid LAD disease. Plan was to let her recover x 4 weeks and then reassess for possible CABG. She went into afib briefly. Treated with IV amio, then transitioned to PO after appropriate time. She was not started on [facility].  She presented to [facility]191[facility]19 with SOB, orthopnea, and PND. Denies CP, weight gain, or peripheral edema. Pertinent admission labs include: creatinine 1.03, BNP 1045, troponin 0.76 (>65 on 10/12), WBC 13.3.   CXR showed enlarged heart with calcific atherosclerotic disease of the aorta. Left lower lobe peribronchial airspace consolidation versus atelectasis.  Repeat Echo showed worse EF 30-35% and heavy smoke. She has refused the last two doses of IV lasix, but received 40 mg IV lasix 10/19. Weight down 1 lb. She is not on AC.  She feels a little better today. No SOB on exertion. Denies orthopnea. She still has a dry cough. No fever or chills. Denies any CP. Having mild dizziness when she first stands up.   Creatinine stable 0.83. SBP 100-120s. Sluggish UOP, but had refused IV lasix yesterday. (agreeable today)  SH: No ETOH, tobacco, or drugs. Lives in [place] with her husband. No problems with getting medications or transportation  FH: Father died of MI at 10, brother died of MI at  71  Echo 2018/04/14: - Left ventricle: The cavity size was normal. Systolic function was   moderately to severely reduced. The estimated ejection fraction   was in the range of 30% to 35%. Wall motion was normal; there   were no regional wall motion abnormalities. - Aortic valve: There was mild regurgitation. - Mitral valve: There was mild regurgitation. - Right atrium: The atrium was normal in size. - Pulmonary arteries: Systolic pressure was within the normal   range. Impressions: - There is akinesis of the basal and mid inferoseptal,   anteroseptal, anterior and apical septal and anterior walls.   There is no definitive thrombus but heavy smoke consistent with   pre-thrombotic state. Anticoagulation is recommended.  Echo 04/05/18: - Left ventricle: The cavity size was normal. Wall thickness was increased in a pattern of mild LVH. Systolic function was mildly to moderately reduced. The estimated ejection fraction was in the range of 40% to 45%. Anterior, anteroseptal, apical and inferoapical akinesis suggestive of LAD territory infarct. No apical thrombus by Definity contrast. The study is not technically sufficient to allow evaluation of LV diastolic function. - Aortic valve: Sclerosis without stenosis. There was mild regurgitation. - Mitral valve: Mildly thickened leaflets . There was trivial regurgitation. - Left atrium: The atrium was normal in size. - Inferior vena cava: The vessel was dilated. The respirophasic diameter changes were blunted (<50%), consistent with elevated central venous pressure.  R/LHC 04/04/18:  Dist RCA lesion is 95% stenosed. This occurs just before a high  bifurcation of the large posterior lateral artery and posterior descending artery.  LV end diastolic pressure is severely elevated. LVEDP 30 mm Hg.  There is no aortic valve stenosis.  Ost LAD to Prox LAD lesion is 100% stenosed.  A drug-eluting stent was successfully  placed using a STENT SYNERGY DES 3X38.  Post intervention, there is a 0% residual stenosis.  Mid LAD lesion is 75% stenosed, distal to the stented area.  Mid LM to Dist LM lesion is 30% stenosed.   Complex multivessel disease.  Given her age and elevated LVEDP, we did not think she would be a good candidate for emergency surgery.  Therefore, we stented the LAD to restore TIMI-3 flow.  There is still residual distal left main disease and mid LAD disease which was not treated today.  There is complex bifurcation disease on the right.  The plan will be to let her recover over the next 4 weeks, on dual antiplatelet therapy.  Then we can consider whether she would be a candidate for bypass surgery after that.  Recommend uninterrupted dual antiplatelet therapy with Aspirin 81mg  daily and Ticagrelor 90mg  twice daily for a minimum of 12 months (ACS - Class I recommendation).  DAPT may need to be temporarily held for CABG after 30 days.    Review of Systems: [y] = yes, [ ]  = no   General: Weight gain [ ] ; Weight loss [ ] ; Anorexia [ ] ; Fatigue [ ] ; Fever [ ] ; Chills [ ] ; Weakness [ ]   Cardiac: Chest pain/pressure [ ] ; Resting SOB [ ] ; Exertional SOB [ ] ; Orthopnea [ ] ; Pedal Edema [ ] ; Palpitations [ ] ; Syncope [ ] ; Presyncope [ ] ; Paroxysmal nocturnal dyspnea[ ]   Pulmonary: Cough [ ] ; Wheezing[ ] ; Hemoptysis[ ] ; Sputum [ ] ; Snoring [ ]   GI: Vomiting[ ] ; Dysphagia[ ] ; Melena[ ] ; Hematochezia [ ] ; Heartburn[ ] ; Abdominal pain [ ] ; Constipation [ ] ; Diarrhea [ ] ; BRBPR [ ]   GU: Hematuria[ ] ; Dysuria [ ] ; Nocturia[ ]   Vascular: Pain in legs with walking [ ] ; Pain in feet with lying flat [ ] ; Non-healing sores [ ] ; Stroke [ ] ; TIA [ ] ; Slurred speech [ ] ;  Neuro: Headaches[ ] ; Vertigo[ ] ; Seizures[ ] ; Paresthesias[ ] ;Blurred vision [ ] ; Diplopia [ ] ; Vision changes [ ]   Ortho/Skin: Arthritis [ ] ; Joint pain [ ] ; Muscle pain [ ] ; Joint swelling [ ] ; Back Pain [ ] ; Rash [ ]   Psych: Depression[ ] ; Anxiety[  ]  Heme: Bleeding problems [ ] ; Clotting disorders [ ] ; Anemia [ ]   Endocrine: Diabetes [ ] ; Thyroid dysfunction[ ]   Home Medications Prior to Admission medications   Medication Sig Start Date End Date Taking? Authorizing Provider  amiodarone (PACERONE) 200 MG tablet Take 400mg  (2 tabs) twice a day for 2 weeks, then 200mg  (1 tab) twice a day for 2 weeks, then 200mg  daily. 04/08/18  Yes Arty Baumgartner, NP  APPLE CIDER VINEGAR PO Take 1 tablet by mouth daily.   Yes [provider]  aspirin 81 MG chewable tablet Chew 1 tablet (81 mg total) by mouth daily. 04/08/18  Yes Arty Baumgartner, NP  atorvastatin (LIPITOR) 80 MG tablet Take 1 tablet (80 mg total) by mouth daily at 6 PM. 04/08/18  Yes Arty Baumgartner, NP  Boswellia Serrata (BOSWELLIA PO) Take 1 tablet by mouth daily.   Yes [provider]  Cholecalciferol (VITAMIN D-3) 5000 UNITS TABS Take 5,000 Units by mouth daily.    Yes [provider]  ferrous sulfate 325 (65 FE) MG tablet Take 325 mg by mouth daily with breakfast.   Yes [provider]  GLUCOSAMINE HCL PO Take 1 tablet by mouth daily.   Yes [provider]  isosorbide mononitrate (IMDUR) 30 MG 24 hr tablet Take 1 tablet (30 mg total) by mouth daily. 04/08/18  Yes Laverda Page B, NP  Magnesium Carbonate (MAGONATE PO) Take 1 tablet by mouth daily.   Yes [provider]  metoprolol succinate (TOPROL XL) 25 MG 24 hr tablet Take 1 tablet (25 mg total) by mouth daily. 04/08/18  Yes Arty Baumgartner, NP  Misc Natural Products (GREEN TEA) TABS Take 1 tablet by mouth daily.   Yes [provider]  Misc Natural Products (LUTEIN 20 PO) Take 1 tablet by mouth daily.   Yes [provider]  Multiple Vitamin (MULTIVITAMIN) capsule Take 1 capsule by mouth daily.   Yes [provider]  nitroGLYCERIN (NITROSTAT) 0.4 MG SL tablet Place 1 tablet (0.4 mg total) under the tongue every 5 (five) minutes as needed for  chest pain. 04/08/18  Yes Laverda Page B, NP  OIL OF OREGANO PO Take 1 tablet by mouth daily.   Yes [provider]  Omega-3 1000 MG CAPS Take 1,000 mg by mouth daily.    Yes [provider]  OVER THE COUNTER MEDICATION Lectin   Yes [provider]  SPIRULINA PO Take 500 mg by mouth daily.   Yes [provider]  ticagrelor (BRILINTA) 90 MG TABS tablet Take 1 tablet (90 mg total) by mouth 2 (two) times daily. 04/08/18  Yes Arty Baumgartner, NP    Past Medical History: Past Medical History:  Diagnosis Date  . Acute systolic heart failure (HCC)   . Hyperlipidemia   . PAF (paroxysmal atrial fibrillation) (HCC)   . STEMI (ST elevation myocardial infarction) (HCC)    04/04/18 PCI/DES to the pLAD, 95% dRCA (possible staged intervention), 75% mLAD, EF 45%    Past Surgical History: Past Surgical History:  Procedure Laterality Date  . CHOLECYSTECTOMY    . CHOLECYSTECTOMY  08/09/2011   Procedure: LAPAROSCOPIC CHOLECYSTECTOMY;  Surgeon: Robyne Askew, MD;  Location: [facility] ORS;  Service: General;  Laterality: N/A;  . CORONARY/GRAFT ACUTE MI REVASCULARIZATION N/A 04/04/2018   Procedure: Coronary/Graft Acute MI Revascularization;  Surgeon: Corky Crafts, MD;  Location: [facility] INVASIVE CV LAB;  Service: Cardiovascular;  Laterality: N/A;  . HERNIA REPAIR    . LEFT HEART CATH AND CORONARY ANGIOGRAPHY N/A 04/04/2018   Procedure: LEFT HEART CATH AND CORONARY ANGIOGRAPHY;  Surgeon: Corky Crafts, MD;  Location: [facility] INVASIVE CV LAB;  Service: Cardiovascular;  Laterality: N/A;  . TUBAL LIGATION      Family History: Family History  Problem Relation Age of Onset  . Hypertension Mother     Social History: Social History   Socioeconomic History  . Marital status: Married    Spouse name: Not on file  . Number of children: Not on file  . Years of education: Not on file  . Highest education level: Not on file  Occupational History  . Not on file    Social Needs  . Financial resource strain: Not on file  . Food insecurity:    Worry: Not on file    Inability: Not on file  . Transportation needs:    Medical: Not on file    Non-medical: Not on file  Tobacco Use  . Smoking status: Never Smoker  .  Smokeless tobacco: Never Used  Substance and Sexual Activity  . Alcohol use: No  . Drug use: No  . Sexual activity: Not on file  Lifestyle  . Physical activity:    Days per week: Not on file    Minutes per session: Not on file  . Stress: Not on file  Relationships  . Social connections:    Talks on phone: Not on file    Gets together: Not on file    Attends religious service: Not on file    Active member of club or organization: Not on file    Attends meetings of clubs or organizations: Not on file    Relationship status: Not on file  Other Topics Concern  . Not on file  Social History Narrative  . Not on file    Allergies:  No Known Allergies  Objective:    Vital Signs:   Temp:  [98 F (36.7 C)-98.8 F (37.1 C)] 98 F (36.7 C) (10/21 0433) Pulse Rate:  [81-111] 81 (10/21 0433) Resp:  [16-19] 18 (10/21 0433) BP: (107-136)/(55-73) 136/73 (10/21 0433) SpO2:  [89 %-95 %] 95 % (10/21 0433) Weight:  [94.1 kg] 94.1 kg (10/21 0433) Last BM Date: 04/12/18  Weight change: Filed Weights   04/12/18 1303 04/13/18 0626 04/14/18 0433  Weight: 94.3 kg 93.2 kg 94.1 kg    Intake/Output:   Intake/Output Summary (Last 24 hours) at 04/14/2018 1038 Last data filed at 04/14/2018 0947 Gross per 24 hour  Intake 356.85 ml  Output 600 ml  Net -243.15 ml      Physical Exam    General:  Elderly. No resp difficulty HEENT: normal Neck: supple. JVP ~10. Carotids 2+ bilat; no bruits. No lymphadenopathy or thyromegaly appreciated. Cor: PMI nondisplaced. Regular rate & rhythm. No rubs, gallops or murmurs. Lungs: diminished in bases. + cough.  Abdomen: obese, soft, nontender, nondistended. No hepatosplenomegaly. No bruits or  masses. Good bowel sounds. Extremities: no cyanosis, clubbing, rash, edema. Warm to touch. RUE ecchymotic from hand to elbow. Tender to touch. No hematoma.  Neuro: alert & orientedx3, cranial nerves grossly intact. moves all 4 extremities w/o difficulty. Affect pleasant   Telemetry   NSR 70s with 1st degree AV block. Personally reviewed.   EKG    NSR 82 bpm with 1st degree AV block. Improved anterior ST elevation when compared to previous. Personally reviewed.   Labs   Basic Metabolic Panel: Recent Labs  Lab 04/12/18 1009 04/13/18 0525 04/14/18 0531  NA 138 140 138  K 4.0 4.1 3.8  CL 108 105 108  CO2 21* 26 23  GLUCOSE 127* 114* 115*  BUN 31* 29* 24*  CREATININE 1.03* 0.90 0.83  CALCIUM 8.9 8.9 8.6*    Liver Function Tests: No results for input(s): AST, ALT, ALKPHOS, BILITOT, PROT, ALBUMIN in the last 168 hours. No results for input(s): LIPASE, AMYLASE in the last 168 hours. No results for input(s): AMMONIA in the last 168 hours.  CBC: Recent Labs  Lab 04/12/18 1009  WBC 13.3*  HGB 11.4*  HCT 37.1  MCV 101.6*  PLT 226    Cardiac Enzymes: No results for input(s): CKTOTAL, CKMB, CKMBINDEX, TROPONINI in the last 168 hours.  BNP: BNP (last 3 results) Recent Labs    04/12/18 1009  BNP 1,045.0*    ProBNP (last 3 results) No results for input(s): PROBNP in the last 8760 hours.   CBG: No results for input(s): GLUCAP in the last 168 hours.  Coagulation Studies: No results for  input(s): LABPROT, INR in the last 72 hours.   Imaging    No results found.   Medications:     Current Medications: . amiodarone  400 mg Oral BID  . aspirin  81 mg Oral Daily  . atorvastatin  80 mg Oral q1800  . cholecalciferol  5,000 Units Oral Daily  . ferrous sulfate  325 mg Oral Q breakfast  . furosemide  20 mg Intravenous Daily  . isosorbide mononitrate  30 mg Oral Daily  . losartan  25 mg Oral Daily  . metoprolol succinate  25 mg Oral Daily  . sodium chloride  flush  3 mL Intravenous Q12H  . sodium chloride flush  3 mL Intravenous Q12H  . spironolactone  12.5 mg Oral Daily  . ticagrelor  90 mg Oral BID     Infusions: . sodium chloride    . sodium chloride         Patient Profile   [name] is a 84 y.o. female with history of CAD with STEMI and PCI 04/04/2018, hyperlipidemia, PAF after STEMI (not on AC), and chronic systolic HF due to ICM (EF 30-35%).  Admitted 04/12/18 with worsening SOB.  Assessment/Plan   1. Acute on chronic systolic HF due to ICM. - S/p LHC 04/04/18 with DES to LAD. Has - Echo 04/05/18 showed EF 40-45% - Echo this admit with lower EF 30-35% with smoke - Volume status remains elevated.  - She has been refusing IV lasix. She now understands what it is for and is agreeable. She is on 20 mg IV lasix daily. Will keep at that for now since she was not on any diuretics at home.  - Continue spiro 12.5 mg daily (added today) - Continue losartan 25 mg daily. SBP 100-130s. Hold off on transitioning to [place] in 84 year old on DAPT with possible need for [facility] as well.  - Continue Toprol XL 25 mg daily  2. CAD with recent STEMI - LHC 04/04/18 with DES to LAD. Distal RCA with 95% stenosis.  - Continue ASA, brilinta, statin, BB - Continue imdur 30 mg daily - Original plan was for staged PCI today. This is on hold due to thrombus formation. Also considering possible CABG after 4 weeks DAPT if unable to do PCI.    3. PAF - Remains in NSR.  - CHADSVASC 5 (CHF, age, sex category, vasc dz) - Brief, occurred after STEMI - On amio 400 mg BID (on taper). Did not start AC.  4. ?Early LV thrombus - Smoke noted on echo.  - Not currently on any AC. Will review with Dr Shirlee Latch. Should probably start heparin vs coumadin, pending PCI/CABG plan.   5. Hyperlipidemia - Continue statin   Medication concerns reviewed with patient and pharmacy team. Barriers identified: none at this time  Length of Stay: 2  Alford Highland, NP   04/14/2018, 10:38 AM  Advanced Heart Failure Team Pager [phone] (M-F; 7a - 4p)  Please contact [facility] for night-coverage after hours (4p -7a ) and weekends on [email or web address]

## 2018-04-14 NOTE — Evaluation (Signed)
Occupational Therapy Evaluation and Discharge Patient Details Name: [name] MRN: [id number] DOB: 05/09/1934 Today's Date: 04/14/2018    History of Present Illness [name] is a 84 y.o. female with CAD s/p recent anterior wall STEMI 04/04/18 w/ cath showing severe 2V disease, treated with emergent PCI of 100% occluded pLCx with plans for stage RCA PCI for 95% distal stenosis, systolic HF w/ EF of 40% and atrial fibrillation, presenting to ED with SOB secondary to acute CHF.    Clinical Impression   This 84 yo female admitted with above presents to acute OT at an overall S level for basic ADLs and will have this at home from husband. I am a little concerned that she is using an office chair as her "walker" when she has a walker so feel HHOT would be good to see about safety in the home. No further acute OT needs, we will sign off.     Follow Up Recommendations  Home health OT;Supervision/Assistance - 24 hour    Equipment Recommendations  None recommended by OT       Precautions / Restrictions Precautions Precautions: None Restrictions Weight Bearing Restrictions: No      Mobility Bed Mobility Overal bed mobility: Independent Bed Mobility: Supine to Sit              Transfers Overall transfer level: Needs assistance Equipment used: None Transfers: Sit to/from Stand Sit to Stand: Supervision                  ADL either performed or assessed with clinical judgement   ADL                                         General ADL Comments: Overall at a S level for basic ADLs and has this at home from husband     Vision Baseline Vision/History: Legally blind              Pertinent Vitals/Pain Pain Assessment: Faces Faces Pain Scale: Hurts a little bit Pain Location: Soreness RUE, with noted bruising; pt reports this is post cardiac Cath Pain Descriptors / Indicators: Sore Pain Intervention(s): Monitored during session      Hand Dominance Right   Extremity/Trunk Assessment Upper Extremity Assessment Upper Extremity Assessment: Overall WFL for tasks assessed           Communication Communication Communication: No difficulties   Cognition Arousal/Alertness: Awake/alert Behavior During Therapy: WFL for tasks assessed/performed Overall Cognitive Status: Within Functional Limits for tasks assessed                                                Home Living Family/patient expects to be discharged to:: Private residence Living Arrangements: Spouse/significant other Available Help at Discharge: Family;Available 24 hours/day Type of Home: House Home Access: Stairs to enter [employer] of Steps: 2 Entrance Stairs-Rails: None Home Layout: Two level;Other (Comment)(has a stair lift)     Bathroom Shower/Tub: Walk-in shower;Door   [employer] Toilet: Standard     Home Equipment: Environmental consultant - 2 wheels   Additional Comments: toilet with bidet; pt reports she has an office chair she uses as her walker because it moves faster and she can just push it up to somewhere and have  as seat to sit on      Prior Functioning/Environment Level of Independence: Independent with assistive device(s)                 OT Problem List: Impaired balance (sitting and/or standing)         OT Goals(Current goals can be found in the care plan section) Acute Rehab OT Goals Patient Stated Goal: to go home  OT Frequency:                AM-PAC PT "6 Clicks" Daily Activity     Outcome Measure Help from another person eating meals?: None Help from another person taking care of personal grooming?: A Little(needs S) Help from another person toileting, which includes using toliet, bedpan, or urinal?: A Little(needs S) Help from another person bathing (including washing, rinsing, drying)?: A Little(needs S) Help from another person to put on and taking off regular upper body clothing?: A  Little(needs S) Help from another person to put on and taking off regular lower body clothing?: A Little(needs S) 6 Click Score: 19   End of Session    Activity Tolerance: Patient tolerated treatment well Patient left: in bed;with call bell/phone within reach;with bed alarm set  OT Visit Diagnosis: Unsteadiness on feet (R26.81)                Time: 1610-9604 OT Time Calculation (min): 17 min Charges:  OT General Charges $OT Visit: 1 Visit OT Evaluation $OT Eval Moderate Complexity: 1 Mod  Ignacia Palma, OTR/L Acute [employer] [phone] Office [phone]

## 2018-04-14 NOTE — Plan of Care (Signed)

## 2018-04-14 NOTE — Progress Notes (Signed)
Daughter Burnetta Sabin called as she reports pt and spouse do not commuicate well. She is wondering why they are doing a procedure when the heart doc felt it was  Too difficult last week. I paged mD to call her per her request

## 2018-04-14 NOTE — Patient Outreach (Signed)
[facility]) Care Management  04/14/2018  [name] 11/02/1933 [id number]   Patient noted to be hospitalized.  Hospital liaison notified for follow up and re-assignment.    Plan: RN CM will close current program.  Bary Leriche, RN, MSN [facility] Care Management Care Management Coordinator Direct Line [phone] Cell [phone] Toll Free: [phone]  Fax: [phone]

## 2018-04-15 ENCOUNTER — Ambulatory Visit: Payer: PPO

## 2018-04-15 DIAGNOSIS — I2510 Atherosclerotic heart disease of native coronary artery without angina pectoris: Secondary | ICD-10-CM

## 2018-04-15 LAB — BASIC METABOLIC PANEL
Anion gap: 10 (ref 5–15)
BUN: 32 mg/dL — AB (ref 8–23)
CALCIUM: 9 mg/dL (ref 8.9–10.3)
CO2: 24 mmol/L (ref 22–32)
Chloride: 104 mmol/L (ref 98–111)
Creatinine, Ser: 1.2 mg/dL — ABNORMAL HIGH (ref 0.44–1.00)
GFR calc Af Amer: 48 mL/min — ABNORMAL LOW (ref >60)
GFR, EST NON AFRICAN AMERICAN: 41 mL/min — AB (ref >60)
Glucose, Bld: 147 mg/dL — ABNORMAL HIGH (ref 70–99)
Potassium: 3.9 mmol/L (ref 3.5–5.1)
SODIUM: 138 mmol/L (ref 135–145)

## 2018-04-15 LAB — MAGNESIUM: MAGNESIUM: 1.9 mg/dL (ref 1.7–2.4)

## 2018-04-15 MED ORDER — FUROSEMIDE 40 MG PO TABS
40.0000 mg | ORAL_TABLET | Freq: Every day | ORAL | Status: DC
  Administered 2018-04-15: 40 mg via ORAL
  Filled 2018-04-15 (×2): qty 1

## 2018-04-15 MED ORDER — BENZONATATE 100 MG PO CAPS
100.0000 mg | ORAL_CAPSULE | Freq: Three times a day (TID) | ORAL | Status: DC | PRN
  Administered 2018-04-15 – 2018-04-16 (×2): 100 mg via ORAL
  Filled 2018-04-15 (×2): qty 1

## 2018-04-15 MED ORDER — GUAIFENESIN-DM 100-10 MG/5ML PO SYRP
5.0000 mL | ORAL_SOLUTION | ORAL | Status: DC | PRN
  Administered 2018-04-15: 5 mL via ORAL
  Filled 2018-04-15 (×2): qty 5

## 2018-04-15 NOTE — Progress Notes (Addendum)
Progress Note  Patient Name: [name] Date of Encounter: 04/15/2018  Primary Cardiologist: Lance Muss, MD   Subjective   No significant overnight events. Patient states breathing has improved from yesterday and she feels like she can take deep breaths again. Patient denies any chest pain. Patient wants to go home.  Inpatient Medications    Scheduled Meds: . amiodarone  400 mg Oral BID  . apixaban  5 mg Oral BID  . aspirin  81 mg Oral Daily  . atorvastatin  80 mg Oral q1800  . cholecalciferol  5,000 Units Oral Daily  . clopidogrel  300 mg Oral Once  . [START ON 04/16/2018] clopidogrel  75 mg Oral Daily  . ferrous sulfate  325 mg Oral Q breakfast  . isosorbide mononitrate  30 mg Oral Daily  . metoprolol succinate  25 mg Oral Daily  . sodium chloride flush  3 mL Intravenous Q12H  . sodium chloride flush  3 mL Intravenous Q12H  . spironolactone  12.5 mg Oral Daily   Continuous Infusions: . sodium chloride    . sodium chloride     PRN Meds: sodium chloride, sodium chloride, acetaminophen, nitroGLYCERIN, sodium chloride flush, sodium chloride flush   Vital Signs    Vitals:   04/14/18 0433 04/14/18 1111 04/14/18 2040 04/15/18 0519  BP: 136/73 126/77 (!) 127/57 134/75  Pulse: 81 88 91 79  Resp: 18  20 20   Temp: 98 F (36.7 C)  98.8 F (37.1 C) 98.6 F (37 C)  TempSrc: Oral  Oral Oral  SpO2: 95% 93% 90% 93%  Weight: 94.1 kg   93 kg  Height:        Intake/Output Summary (Last 24 hours) at 04/15/2018 0904 Last data filed at 04/15/2018 0740 Gross per 24 hour  Intake 641.66 ml  Output 1100 ml  Net -458.34 ml   Filed Weights   04/13/18 0626 04/14/18 0433 04/15/18 0519  Weight: 93.2 kg 94.1 kg 93 kg    Telemetry    Normal sinus rhythm/mild sinus tachycardia with rate ranging between 70s and 110s bpm. - Personally Reviewed  Physical Exam   GEN: No acute distress.   Neck: Supple. Cardiac: RRR. No murmurs, rubs, or gallops.  Respiratory: Overall  clear to auscultation except for faint rale in bases. GI: Abdomen soft, nontender, non-distended. Bowel sounds present.  MS: No significant lower extremity edema. Distal pedal pulses 2+ and equal bilaterally. Extensive ecchymosis of right upper extremity. Neuro:  No focal deficits.  Psych: Normal affect.  Labs    Chemistry Recent Labs  Lab 04/13/18 0525 04/14/18 0531 04/15/18 0545  NA 140 138 138  K 4.1 3.8 3.9  CL 105 108 104  CO2 26 23 24   GLUCOSE 114* 115* 147*  BUN 29* 24* 32*  CREATININE 0.90 0.83 1.20*  CALCIUM 8.9 8.6* 9.0  GFRNONAA 58* >60 41*  GFRAA >60 >60 48*  ANIONGAP 9 7 10      Hematology Recent Labs  Lab 04/12/18 1009  WBC 13.3*  RBC 3.65*  HGB 11.4*  HCT 37.1  MCV 101.6*  MCH 31.2  MCHC 30.7  RDW 13.5  PLT 226    Cardiac EnzymesNo results for input(s): TROPONINI in the last 168 hours.  Recent Labs  Lab 04/12/18 1017  TROPIPOC 0.76*     BNP Recent Labs  Lab 04/12/18 1009  BNP 1,045.0*     DDimer No results for input(s): DDIMER in the last 168 hours.   Radiology  No results found.  Cardiac Studies   Echocardiogram 04/13/18: Study Conclusions - Left ventricle: The cavity size was normal. Systolic function was moderately to severely reduced. The estimated ejection fraction was in the range of 30% to 35%. Wall motion was normal; there were no regional wall motion abnormalities. - Aortic valve: There was mild regurgitation. - Mitral valve: There was mild regurgitation. - Right atrium: The atrium was normal in size. - Pulmonary arteries: Systolic pressure was within the normal range.  Impressions: - There is akinesis of the basal and mid inferoseptal, anteroseptal, anterior and apical septal and anterior walls. There is no definitive thrombus but heavy smoke consistent with pre-thrombotic state. Anticoagulation is recommended.  _______________  Left Heart Catheterization 04/04/2018:  Dist RCA lesion is 95%  stenosed. This occurs just before a high bifurcation of the large posterior lateral artery and posterior descending artery.  LV end diastolic pressure is severely elevated. LVEDP 30 mm Hg.  There is no aortic valve stenosis.  Ost LAD to Prox LAD lesion is 100% stenosed.  A drug-eluting stent was successfully placed using a STENT SYNERGY DES 3X38.  Post intervention, there is a 0% residual stenosis.  Mid LAD lesion is 75% stenosed, distal to the stented area.  Mid LM to Dist LM lesion is 30% stenosed.   Complex multivessel disease.  Given her age and elevated LVEDP, we did not think she would be a good candidate for emergency surgery.  Therefore, we stented the LAD to restore TIMI-3 flow.  There is still residual distal left main disease and mid LAD disease which was not treated today.  There is complex bifurcation disease on the right.  The plan will be to let her recover over the next 4 weeks, on dual antiplatelet therapy.  Then we can consider whether she would be a candidate for bypass surgery after that.  Recommend uninterrupted dual antiplatelet therapy with Aspirin 81mg  daily and Ticagrelor 90mg  twice daily for a minimum of 12 months (ACS - Class I recommendation).  DAPT may need to be temporarily held for CABG after 30 days.   High dose statin, beta blocker and DAPT orally.  For now, will give IV Lasix and IV NTG to help with volume overload.  Patient Profile     Thedora Rings Rayleis a 84 y.o.femalewith CAD s/p recent anterior wall STEMI 04/04/18 w/ cath showing severe 2V disease, treated with emergent PCI of 100% occluded pLCx with plans for stage RCA PCI for 95% distal stenosis, systolic HF w/ EF of 40% and atrial fibrillation, presenting to ED with SOB secondary to acute CHF.  Assessment & Plan    1. Acute on Chronic Systolic Heart Failure - BNP elevated at 1,400.  - Chest x-ray shows CHF cephalization and Kerley B lines.  - Echo on 04/13/2018 showed LVEF 30-35% which is  slightly lower than LVEF of 40-45% on 04/05/2018. Low EF due to ischemia vs. thrombus vs. readers margin.  - Breathing is improving. - Net I&O negative 1.7L. - IV Lasix has been held at this time due to renal function. SCr 1.20 today, up from 0.83 yesterday.  - Losartan also held due to renal function. - Continue Toprol XL 25mg  daily.  - Spironolactone 12.5mg  daily added yesterday. - May consider Entresto prior to discharge pending renal function.  - Continue monitoring daily weights and strict I's&O's. - Patient will need heart failure education at discharge.  2. CAD s/p PCI  - S/p PCI to proximal left circumflex.  - Left  and right cath was initially scheduled for tomorrow but was cancelled. Dr. Excell Seltzer discussed case with Dr. Eldridge Dace - CABG may be the safest treatment option given complex bifurcation disease of RCA but patient is not interested in even considering CABG at this time. Therefore, it is reasonable to proceed with medical therapy at this time.  - Patient denies any angina at this time. - Continue Lipitor 80mg  daily. - Eliquis added yesterday for "LV smoke" and previous incidence of atrial fibrillation during last hospitalization. Brilinta switched to Plavix to reduce bleeding risk. Plan will be to continue Aspirin 81mg  for another 2-3 weeks to get her out to 1 month from her initial event. After 30 days, will continue Plavix and Eliquis.    3. Possible Early Thrombus Formation in LV  - Echo showed no definitive thrombus but heavy smoke consistent with pre-thrombotic state. - Eliquis added yesterday.  4. Paroxysmal Atrial Fibrillation - Brief episode of atrial fibrillation last admission. At that time, it was not felt like it warranted long term anticoagulation. However, Echo this admission showed "heavy smoke" consistent with pre-thrombotic state.  - No atrial fibrillation noted on telemetry this admission.  - Eliquis added yesterday.   - Continue amiodarone.   5.  Hyperlipidemia - LDL 126 on 04/04/2018. - Continue high intensity statin as above. Will need repeat labs in a few weeks.  For questions or updates, please contact [facility] Please consult [email or web address] for contact info under        Signed, Corrin Parker, PA-C  04/15/2018, 9:04 AM    I have personally seen and examined this patient with Marjie Skiff, PA-C. I agree with the assessment and plan as outlined above. She is feeling much better this am. JVD is mildly elevated, No LE edema. Lungs clear. Soft systolic murmur. She has no chest pain this am. Her breathing is much better following diuresis. She has been started on Eliquis for smoke in her ventricle (pre thrombotic state).  Will consider CABG vs complex PCI vs medical management of CAD at her follow up with Dr. Eldridge Dace. She is not excited to think about the possibility of CABG.  Will continue ASA/Plavix along with Eliquis for one month post MI then stop ASA.  Her volume status is much improved.  Will change to po Lasix today. Repeat creatinine in the am.  Possible discharge home tomorrow.   Verne Carrow 04/15/2018 11:14 AM

## 2018-04-15 NOTE — Care Management Important Message (Signed)
Important Message  Patient Details  Name: [name] MRN: [id number] Date of Birth: 1933/09/03   Medicare Important Message Given:  Yes    Deeksha Cotrell P Ladarion Munyon 04/15/2018, 1:07 PM

## 2018-04-15 NOTE — Progress Notes (Signed)
PT Cancellation Note  Patient Details Name: [name] MRN: [id number] DOB: 1933/10/30   Cancelled Treatment:    Reason Eval/Treat Not Completed: Other (comment). Pt visiting with family, politely requesting PT come back for treatment session. Will follow-up as schedule permits.  Ina Homes, PT, DPT Acute Rehabilitation Services  Pager [phone] Office [phone]  Malachy Chamber 04/15/2018, 11:36 AM

## 2018-04-15 NOTE — Progress Notes (Signed)
Physical Therapy Treatment Patient Details Name: [name] MRN: [id number] DOB: [birth year] Today's Date: 04/15/2018    History of Present Illness Pt is a 84 y.o. female with CAD s/p recent anterior wall STEMI 04/04/18 s/p PCA to prximal L circumflex, now admitted 04/12/18 with SOB secondary to CHF. PMH also includes PAF, HF, HLD.   PT Comments    Pt progressing well with mobility. Pt reports using rolling office chair at home for support with household ambulation; suggested use of rollator since pt likes having the option to sit and rest. Pt reports "I think I have once of those in a box somewhere." Ambulatory with rollator at supervision-level. Encouraged use of one at home due to safety concerns with current situation. Will continue to follow acutely.    Follow Up Recommendations  Home health PT;Supervision/Assistance - 24 hour     Equipment Recommendations  None recommended by PT    Recommendations for Other Services       Precautions / Restrictions Precautions Precautions: Fall Restrictions Weight Bearing Restrictions: No    Mobility  Bed Mobility Overal bed mobility: Modified Independent Bed Mobility: Supine to Sit;Sit to Supine           General bed mobility comments: Increased time and effort; no physical assist required  Transfers Overall transfer level: Needs assistance Equipment used: None Transfers: Sit to/from Stand           General transfer comment: Stood from bed and toilet  Ambulation/Gait Ambulation/Gait assistance: Supervision Gait Distance (Feet): 350 Feet Assistive device: 4-wheeled walker Gait Pattern/deviations: Step-through pattern;Decreased stride length;Trunk flexed Gait velocity: Decreased Gait velocity interpretation: 1.31 - 2.62 ft/sec, indicative of limited community ambulator General Gait Details: Cues to maintain closer proximity to rollator and upright posture; DOE 2/4   Stairs             Wheelchair Mobility    Modified Rankin (Stroke Patients Only)       Balance Overall balance assessment: Needs assistance   Sitting balance-Leahy Scale: Fair Sitting balance - Comments: Requires assist to reach feet     Standing balance-Leahy Scale: Fair Standing balance comment: 2x self-correct LOB with dynamic standing balance, reliant on UE support to correct                            Cognition Arousal/Alertness: Awake/alert Behavior During Therapy: WFL for tasks assessed/performed Overall Cognitive Status: Within Functional Limits for tasks assessed                                        Exercises      General Comments        Pertinent Vitals/Pain Pain Assessment: No/denies pain    Home Living                      Prior Function            PT Goals (current goals can now be found in the care plan section) Acute Rehab PT Goals Patient Stated Goal: to go home PT Goal Formulation: With patient Time For Goal Achievement: 04/27/18 Potential to Achieve Goals: Good Progress towards PT goals: Progressing toward goals    Frequency    Min 3X/week      PT Plan Current plan remains appropriate    Co-evaluation  AM-PAC PT "6 Clicks" Daily Activity  Outcome Measure  Difficulty turning over in bed (including adjusting bedclothes, sheets and blankets)?: None Difficulty moving from lying on back to sitting on the side of the bed? : None Difficulty sitting down on and standing up from a chair with arms (e.g., wheelchair, bedside commode, etc,.)?: A Little Help needed moving to and from a bed to chair (including a wheelchair)?: A Little Help needed walking in hospital room?: A Little Help needed climbing 3-5 steps with a railing? : A Little 6 Click Score: 20    End of Session Equipment Utilized During Treatment: Gait belt Activity Tolerance: Patient tolerated treatment well Patient left: in bed;with call bell/phone within  reach;with bed alarm set Nurse Communication: Mobility status PT Visit Diagnosis: Other abnormalities of gait and mobility (R26.89);Unsteadiness on feet (R26.81)     Time: 1610-9604 PT Time Calculation (min) (ACUTE ONLY): 18 min  Charges:  $Gait Training: 8-22 mins                     Ina Homes, PT, DPT Acute Rehabilitation Services  Pager [phone] Office [phone]  Malachy Chamber 04/15/2018, 5:33 PM

## 2018-04-16 ENCOUNTER — Ambulatory Visit: Payer: PPO | Admitting: Cardiology

## 2018-04-16 ENCOUNTER — Telehealth: Payer: Self-pay | Admitting: Interventional Cardiology

## 2018-04-16 LAB — BASIC METABOLIC PANEL
Anion gap: 10 (ref 5–15)
BUN: 36 mg/dL — ABNORMAL HIGH (ref 8–23)
CALCIUM: 8.9 mg/dL (ref 8.9–10.3)
CHLORIDE: 103 mmol/L (ref 98–111)
CO2: 24 mmol/L (ref 22–32)
CREATININE: 1.11 mg/dL — AB (ref 0.44–1.00)
GFR, EST AFRICAN AMERICAN: 53 mL/min — AB (ref >60)
GFR, EST NON AFRICAN AMERICAN: 45 mL/min — AB (ref >60)
Glucose, Bld: 115 mg/dL — ABNORMAL HIGH (ref 70–99)
Potassium: 4.7 mmol/L (ref 3.5–5.1)
SODIUM: 137 mmol/L (ref 135–145)

## 2018-04-16 MED ORDER — CLOPIDOGREL BISULFATE 75 MG PO TABS: 75.0000 mg | ORAL_TABLET | Freq: Every day | ORAL | 2 refills | Status: DC

## 2018-04-16 MED ORDER — APIXABAN 5 MG PO TABS: 5.0000 mg | ORAL_TABLET | Freq: Two times a day (BID) | ORAL | 2 refills | Status: DC

## 2018-04-16 MED ORDER — FUROSEMIDE 40 MG PO TABS: 40.0000 mg | ORAL_TABLET | Freq: Every day | ORAL | 2 refills | Status: DC

## 2018-04-16 MED ORDER — SPIRONOLACTONE 25 MG PO TABS: 12.5000 mg | ORAL_TABLET | Freq: Every day | ORAL | 2 refills | Status: DC

## 2018-04-16 MED FILL — FUROSEMIDE 40 MG TABLET: 40 | 30 days supply | Qty: 30 | Fill #0

## 2018-04-16 MED FILL — CLOPIDOGREL 75 MG TABLET: 75 | 10 days supply | Qty: 30 | Fill #0

## 2018-04-16 MED FILL — ELIQUIS 5 MG TABLET: 5 | 30 days supply | Qty: 60 | Fill #0

## 2018-04-16 MED FILL — SPIRONOLACTONE 25 MG TABLET: 25 | 30 days supply | Qty: 30 | Fill #0

## 2018-04-16 NOTE — Plan of Care (Signed)
Nutrition Education Note  RD consulted for nutrition education regarding CHF.  Case discussed with RN prior to visit; pt is discharging today and pt with multiple question regarding diet. Per RN, pt and family eat out frequently.   Spoke with pt, daughter, and husband at bedside. Pt reports that they generally eat out. Favorite restaurants include [employer], [employer], and [employer]. Per pt, her daughter showed her the calorie and sodium contents in commonly eaten foods and pt is was very surprised at the amount of sodium in her favorite foods. Pt also admits to salting her foods. Frequently consumed foods include soup and salad at [employer], sausage, egg, and cheese biscuit from [employer], and hamburger from [employer].   Education was mainly focused on alternative low sodium seasonings and healthier options at restaurants.   RD provided "Low Sodium Nutrition Therapy" and "Sodium Free Seasoning Tips" handouts from the Academy of [employer]. Reviewed patient's dietary recall. Provided examples on ways to decrease sodium intake in diet. Discouraged intake of processed foods and use of salt shaker. Encouraged fresh fruits and vegetables as well as whole grain sources of carbohydrates to maximize fiber intake.   RD discussed why it is important for patient to adhere to diet recommendations, and emphasized the role of fluids, foods to avoid, and importance of weighing self daily. Teach back method used.  Expect fair compliance.  Body mass index is 35.07 kg/m. Pt meets criteria for obesity, class II based on current BMI.  Current diet order is Heart Healthy, patient is consuming approximately 100% of meals at this time. Labs and medications reviewed. No further nutrition interventions warranted at this time. RD contact information provided. If additional nutrition issues arise, please re-consult RD.   Jerlene Rockers A. Mayford Knife, RD, LDN, CDE Pager: [phone] After hours Pager: [phone]

## 2018-04-16 NOTE — Telephone Encounter (Signed)
Pt discharged from the hospital today.  Nursing to call the pt on 04/17/18 for TCM follow-up.

## 2018-04-16 NOTE — Telephone Encounter (Signed)
New message:      TOC appt on 04/24/18 @ 9:30 with Boyce Medici per [place]

## 2018-04-16 NOTE — Discharge Summary (Addendum)
Discharge Summary    Patient ID: [name],  MRN: [id number], DOB/AGE: [birth year] 82 y.o.  Admit date: 04/12/2018 Discharge date: 04/16/2018  Primary Care Provider: Daylene Katayama Primary Cardiologist: Dr. Eldridge Dace   Discharge Diagnoses    Active Problems:   Acute systolic heart failure (HCC)   Hyperlipidemia   Acute CHF (congestive heart failure) (HCC)   Acute on chronic combined systolic and diastolic congestive heart failure (HCC)   Allergies No Known Allergies  Diagnostic Studies/Procedures    Cath: 04/13/18  Study Conclusions  - Left ventricle: The cavity size was normal. Systolic function was   moderately to severely reduced. The estimated ejection fraction   was in the range of 30% to 35%. Wall motion was normal; there   were no regional wall motion abnormalities. - Aortic valve: There was mild regurgitation. - Mitral valve: There was mild regurgitation. - Right atrium: The atrium was normal in size. - Pulmonary arteries: Systolic pressure was within the normal   range.  Impressions:  - There is akinesis of the basal and mid inferoseptal,   anteroseptal, anterior and apical septal and anterior walls.   There is no definitive thrombus but heavy smoke consistent with   pre-thrombotic state. Anticoagulation is recommended. _____________   History of Present Illness     84 y.o. female with CAD s/p recent anterior wall STEMI 04/04/18 w/ cath showing severe 2V disease, treated with emergent PCI of 100% occluded pLCx with plans for stage RCA PCI for 95% distal stenosis, systolic HF w/ EF of 40% and atrial fibrillation, who presented to ED with SOB secondary to acute CHF.   [name] has recently diagnosis of CAD. She was admitted 04/04/18 for acute anterior wall STEMI and was found to have dRCA stenosis 95% just before a bifurcation of the large PDL and PDA, with 100% pLAD. It was felt given her advanced age she would not be a good candidate for  emergency surgery, therefore her LAD was stented emergently. Placed on DAPT with ASA/Brilinta for at least 6 months. Ideally plan to allow for recovery over the next 4 weeks, then consider intervention to the RCA. Trop peaked at >65 x3. LVEDP noted at 30 mmHg and she was diuresed with IV lasix. EF was 40% by echo. She also went into new onset atrial fibrillation andn required treatment with IV amiodarone, and later transitioned to PO. Plan was to taper amiodarone from 400mg  BID x2 weeks, then 200mg  BID x2 weeks, then 200mg  daily. Given the episode of afib was brief, decision made not to anticoagulate at that time.  She was discharged home on 04/08/18. She presented back to the [facility] ED w/ CC of SOB. Had been present since returning home but had worsened.  The morning of admission she woke up with orthopnea and PND. She had mild chest pressure, but not like her angina when she had her MI (more tight/squeezing pain). She reported full med compliance and had been restricting salt at home. She had not noticed any edema or weight gain at home.  In the ED, BNP was elevated at 1,045. POC troponin is 0.76. BMP showed SCr at 1.03. K 4.0. CBC showed leukocytosis with WBC at 13.3 (previously 12.9). Hgb was stable at 11.6. She was afebrile. Felt much better after receiving a dose of lasix. She was admitted for further diuresis.    Hospital Course     She was diuresed with IV lasix and had good UOP. Repeat echo was done  this admission showing further decline of her EF to 30-35% but also noting "LV smoke" concerning for early thrombus formation. Given her Afib during previous admission she was started on Eliquis. Brilinta was therefore switched to Plavix. Plan for triple therapy for one month from her initial STEMI, then stop ASA. Her losartan was held given increase in Cr with IV diuresis. No recurrent Afib noted. Continued on high dose statin. She was transitioned to oral lasix 40mg  daily with continued good UOP noted  and Cr improving. Discharge weight was 204.3lbs and net negative 2L at the time of DC. No further chest pain since admission. Will plan to discuss CABG vs PCI vs medical therapy for RCA lesion at her follow up with Dr. Eldridge Dace.   General: Well developed, well nourished, female appearing in no acute distress. Head: Normocephalic, atraumatic.  Neck: Supple without bruits, JVD. Lungs:  Resp regular and unlabored, CTA. Heart: RRR, S1, S2, no S3, S4, or murmur; no rub. Abdomen: Soft, non-tender, non-distended with normoactive bowel sounds. No hepatomegaly. No rebound/guarding. No obvious abdominal masses. Extremities: No clubbing, cyanosis, edema. Distal pedal pulses are 2+ bilaterally. Right radial cath site stable without bruising or hematoma Neuro: Alert and oriented X 3. Moves all extremities spontaneously. Psych: Normal affect.  [name] was seen by Dr. Excell Seltzer and determined stable for discharge home. Follow up in the office has been arranged. Medications are listed below.   _____________  Discharge Vitals Blood pressure 128/71, pulse 74, temperature 98.3 F (36.8 C), temperature source Oral, resp. rate 18, height 5\' 4"  (1.626 m), weight 92.7 kg, SpO2 94 %.  Filed Weights   04/14/18 0433 04/15/18 0519 04/16/18 0424  Weight: 94.1 kg 93 kg 92.7 kg    Labs & Radiologic Studies    CBC No results for input(s): WBC, NEUTROABS, HGB, HCT, MCV, PLT in the last 72 hours. Basic Metabolic Panel Recent Labs    52/84/13 0545 04/16/18 0524  NA 138 137  K 3.9 4.7  CL 104 103  CO2 24 24  GLUCOSE 147* 115*  BUN 32* 36*  CREATININE 1.20* 1.11*  CALCIUM 9.0 8.9  MG 1.9  --    Liver Function Tests No results for input(s): AST, ALT, ALKPHOS, BILITOT, PROT, ALBUMIN in the last 72 hours. No results for input(s): LIPASE, AMYLASE in the last 72 hours. Cardiac Enzymes No results for input(s): CKTOTAL, CKMB, CKMBINDEX, TROPONINI in the last 72 hours. BNP Invalid input(s):  POCBNP D-Dimer No results for input(s): DDIMER in the last 72 hours. Hemoglobin A1C No results for input(s): HGBA1C in the last 72 hours. Fasting Lipid Panel No results for input(s): CHOL, HDL, LDLCALC, TRIG, CHOLHDL, LDLDIRECT in the last 72 hours. Thyroid Function Tests No results for input(s): TSH, T4TOTAL, T3FREE, THYROIDAB in the last 72 hours.  Invalid input(s): FREET3 _____________  Dg Chest Portable 1 View  Result Date: 04/12/2018 CLINICAL DATA:  Shortness of breath and chest heaviness. EXAM: PORTABLE CHEST 1 VIEW COMPARISON:  Chest CT 08/10/2011 FINDINGS: Enlarged heart. Calcific atherosclerotic disease of the aorta. Mediastinal contours appear intact. There is no evidence of pleural effusion or pneumothorax. Low lung volumes. Left lower lobe peribronchial airspace consolidation versus atelectasis. Osseous structures are without acute abnormality. Soft tissues are grossly normal. IMPRESSION: Enlarged heart with calcific atherosclerotic disease of the aorta. Left lower lobe peribronchial airspace consolidation versus atelectasis. Electronically Signed   By: Ted Mcalpine M.D.   On: 04/12/2018 12:05   Disposition   Pt is being discharged home today  in good condition.  Follow-up Plans & Appointments    Follow-up Information    Allayne Butcher, PA-C Follow up on 04/24/2018.   Specialties:  Cardiology, Radiology Why:  at 9:30am for your follow up appt.  Contact information: [address] [place] [address] [phone]          Discharge Instructions    (HEART FAILURE PATIENTS) Call MD:  Anytime you have any of the following symptoms: 1) 3 pound weight gain in 24 hours or 5 pounds in 1 week 2) shortness of breath, with or without a dry hacking cough 3) swelling in the hands, feet or stomach 4) if you have to sleep on extra pillows at night in order to breathe.   Complete by:  As directed    Diet - low sodium heart healthy   Complete by:  As  directed    Discharge instructions   Complete by:  As directed    For patients with congestive heart failure, we give them these special instructions:  1. Follow a low-salt diet and watch your fluid intake. In general, you should not be taking in more than 2 liters of fluid per day (no more than 8 glasses per day). Some patients are restricted to less than 1.5 liters of fluid per day (no more than 6 glasses per day). This includes sources of water in foods like soup, coffee, tea, milk, etc. 2. Weigh yourself on the same scale at same time of day and keep a log. 3. Call your doctor: (Anytime you feel any of the following symptoms)  - 3-4 pound weight gain in 1-2 days or 2 pounds overnight  - Shortness of breath, with or without a dry hacking cough  - Swelling in the hands, feet or stomach  - If you have to sleep on extra pillows at night in order to breathe   IT IS IMPORTANT TO LET YOUR DOCTOR KNOW EARLY ON IF YOU ARE HAVING SYMPTOMS SO WE CAN HELP YOU!   Increase activity slowly   Complete by:  As directed        Discharge Medications     Medication List    STOP taking these medications   APPLE CIDER VINEGAR PO   BOSWELLIA PO   GLUCOSAMINE HCL PO   Green Tea Tabs   LUTEIN 20 PO   OIL OF OREGANO PO   SPIRULINA PO   ticagrelor 90 MG Tabs tablet Commonly known as:  BRILINTA     TAKE these medications   amiodarone 200 MG tablet Commonly known as:  PACERONE Take 400mg  (2 tabs) twice a day for 2 weeks, then 200mg  (1 tab) twice a day for 2 weeks, then 200mg  daily.   apixaban 5 MG Tabs tablet Commonly known as:  ELIQUIS Take 1 tablet (5 mg total) by mouth 2 (two) times daily.   aspirin 81 MG chewable tablet Chew 1 tablet (81 mg total) by mouth daily.   atorvastatin 80 MG tablet Commonly known as:  LIPITOR Take 1 tablet (80 mg total) by mouth daily at 6 PM.   clopidogrel 75 MG tablet Commonly known as:  PLAVIX Take 1 tablet (75 mg total) by mouth daily.    ferrous sulfate 325 (65 FE) MG tablet Take 325 mg by mouth daily with breakfast.   furosemide 40 MG tablet Commonly known as:  LASIX Take 1 tablet (40 mg total) by mouth daily.   isosorbide mononitrate 30 MG 24 hr tablet Commonly known  as:  IMDUR Take 1 tablet (30 mg total) by mouth daily.   MAGONATE PO Take 1 tablet by mouth daily.   metoprolol succinate 25 MG 24 hr tablet Commonly known as:  TOPROL-XL Take 1 tablet (25 mg total) by mouth daily.   multivitamin capsule Take 1 capsule by mouth daily.   nitroGLYCERIN 0.4 MG SL tablet Commonly known as:  NITROSTAT Place 1 tablet (0.4 mg total) under the tongue every 5 (five) minutes as needed for chest pain.   Omega-3 1000 MG Caps Take 1,000 mg by mouth daily.   OVER THE COUNTER MEDICATION Lectin   spironolactone 25 MG tablet Commonly known as:  ALDACTONE Take 0.5 tablets (12.5 mg total) by mouth daily.   Vitamin D-3 5000 units Tabs Take 5,000 Units by mouth daily.       Acute coronary syndrome (MI, NSTEMI, STEMI, etc) this admission?: No.     Outstanding Labs/Studies   BMET at follow up appt.   Duration of Discharge Encounter   Greater than 30 minutes including physician time.  Signed, Laverda Page NP-C 04/16/2018, 9:55 AM  Patient seen, examined. Available data reviewed. Agree with findings, assessment, and plan as outlined by Laverda Page, NP-C.  On exam, the patient is a pleasant elderly woman in no distress, lung fields are clear, heart is regular rate and rhythm with no murmur gallop, JVP is normal, abdomen is soft and nontender, extremities show no edema, skin has diffuse ecchymosis but no rash.  The patient is clinically improved with IV diuresis.  She is now tolerating a heart failure regimen that includes a beta-blocker, isosorbide, Spironolactone, furosemide.  She has mild acute kidney injury and therefore ACE/ARB/Entresto are held during her hospitalization.  This could be restarted as an  outpatient.  She is given specific instructions regarding antiplatelet/antithrombotic therapy.  She understands that she will be able to discontinue aspirin in a few weeks and remain on a combination of clopidogrel and apixaban.  We also discussed revascularization options at length today.  She really is not interested in the idea of CABG.  Options will be limited to high risk PCI versus ongoing medical therapy.  She will follow-up further with Dr. Eldridge Dace to make a decision regarding staged PCI versus ongoing med Rx. The patient is stable for discharge.   Tonny Bollman, M.D. 04/16/2018 12:57 PM

## 2018-04-16 NOTE — Progress Notes (Signed)
Benefit check for Eliquis per [facility] CMA co pay is $45, no prior authorization is needed. [name] [phone]

## 2018-04-16 NOTE — Progress Notes (Signed)
Pt , spouse and daughter given dc instructions, dtr reviewed, question about taper as she was on before, they did not want to wait to get clarification as they have waited long enough and ready to go home, dtr will call tomorrow. Pt dc home with family

## 2018-04-16 NOTE — Consult Note (Signed)
   [facility] CM Inpatient Consult   04/16/2018  [name] 08-14-1933 [id number]  Met with patient at bedside.  She consents to ongoing [facility] follow up.  Patient will be assigned to a [facility] for complex disease program for hearth failure management and new medications for cost and education.  Patient and family expressed concerns for hospital bills.  Encouraged them to contact the [facility] and they could also contact the hospital patient billing department.  Expressed that it may take some time to get all of the charges and bills sent to them. They verbalized understanding.   For questions, please contact:  Natividad Brood, RN BSN [facility] Liaison  [phone] business mobile phone Toll free office [phone]

## 2018-04-17 ENCOUNTER — Telehealth: Payer: Self-pay | Admitting: Interventional Cardiology

## 2018-04-17 ENCOUNTER — Other Ambulatory Visit: Payer: Self-pay | Admitting: *Deleted

## 2018-04-17 NOTE — Telephone Encounter (Signed)
Patient contacted regarding discharge from [facility] on 04/16/18.  Patient understands to follow up with provider Boyce Medici PA-C on 04/24/18 at 0930 at [facility] location. Patient understands discharge instructions? yes Patient understands medications and regiment? yes Patient understands to bring all medications to this visit? yes  Pt voiced no further questions or concerns at this time.

## 2018-04-17 NOTE — Patient Outreach (Signed)
[facility]) Care Management  04/17/2018  Roshni Burbano Shivley 03/12/1934 [id number]  Initial transition of care.  Initial outreach attempt today was unsuccessful as party requested RN to call back tomorrow. RN will follow up tomorrow morning and further engage with possible needed related to recent discharge.  Elliot Cousin, RN Care Management Coordinator [facility] Main Office [phone]

## 2018-04-18 ENCOUNTER — Other Ambulatory Visit: Payer: Self-pay | Admitting: *Deleted

## 2018-04-18 NOTE — Patient Outreach (Signed)
[facility]) Care Management  04/18/2018  [name] 01-Jul-1933 [id number]    Transition of care (2nd attempt)  RN attempted the 2nd outreach however unsuccessful. Will rescheduled another outreach call next week for pending [facility] services.  Also sent outreach letter to pt for possible call back.  Elliot Cousin, RN Care Management Coordinator [facility] Main Office [phone]

## 2018-04-24 ENCOUNTER — Ambulatory Visit (INDEPENDENT_AMBULATORY_CARE_PROVIDER_SITE_OTHER): Payer: PPO | Admitting: Cardiology

## 2018-04-24 ENCOUNTER — Telehealth: Payer: Self-pay | Admitting: *Deleted

## 2018-04-24 ENCOUNTER — Encounter: Payer: Self-pay | Admitting: Cardiology

## 2018-04-24 ENCOUNTER — Other Ambulatory Visit: Payer: Self-pay | Admitting: *Deleted

## 2018-04-24 VITALS — BP 138/82 | HR 92 | Ht 64.0 in | Wt 202.6 lb

## 2018-04-24 DIAGNOSIS — I2510 Atherosclerotic heart disease of native coronary artery without angina pectoris: Secondary | ICD-10-CM | POA: Diagnosis not present

## 2018-04-24 DIAGNOSIS — Z9861 Coronary angioplasty status: Secondary | ICD-10-CM | POA: Diagnosis not present

## 2018-04-24 DIAGNOSIS — I255 Ischemic cardiomyopathy: Secondary | ICD-10-CM | POA: Diagnosis not present

## 2018-04-24 DIAGNOSIS — I480 Paroxysmal atrial fibrillation: Secondary | ICD-10-CM

## 2018-04-24 DIAGNOSIS — I5022 Chronic systolic (congestive) heart failure: Secondary | ICD-10-CM

## 2018-04-24 DIAGNOSIS — Z5181 Encounter for therapeutic drug level monitoring: Secondary | ICD-10-CM | POA: Diagnosis not present

## 2018-04-24 LAB — BASIC METABOLIC PANEL
BUN / CREAT RATIO: 27 (ref 12–28)
BUN: 32 mg/dL — AB (ref 8–27)
CO2: 24 mmol/L (ref 20–29)
CREATININE: 1.19 mg/dL — AB (ref 0.57–1.00)
Calcium: 9.3 mg/dL (ref 8.7–10.3)
Chloride: 103 mmol/L (ref 96–106)
GFR, EST AFRICAN AMERICAN: 49 mL/min/{1.73_m2} — AB (ref >59)
GFR, EST NON AFRICAN AMERICAN: 43 mL/min/{1.73_m2} — AB (ref >59)
Glucose: 150 mg/dL — ABNORMAL HIGH (ref 65–99)
Potassium: 4 mmol/L (ref 3.5–5.2)
Sodium: 136 mmol/L (ref 134–144)

## 2018-04-24 MED ORDER — AMIODARONE HCL 200 MG PO TABS: ORAL_TABLET | ORAL | 0 refills | Status: DC

## 2018-04-24 MED ORDER — SACUBITRIL-VALSARTAN 24-26 MG PO TABS: 1.0000 | ORAL_TABLET | Freq: Two times a day (BID) | ORAL | 0 refills | Status: DC

## 2018-04-24 NOTE — Patient Instructions (Signed)
Medication Instructions:  Your physician has recommended you make the following change in your medication:  1.  REDUCE the Amiodarone down to 200 mg taking 1 tablet twice a day for 2 weeks then go down to 1 tablet daily 2.  STOP the Aspirin after the dose on 05/05/18  If you need a refill on your cardiac medications before your next appointment, please call your pharmacy.   Lab work: TODAY;  STAT BMET  If you have labs (blood work) drawn today and your tests are completely normal, you will receive your results only by: Marland Kitchen MyChart Message (if you have MyChart) OR . A paper copy in the mail If you have any lab test that is abnormal or we need to change your treatment, we will call you to review the results.  Testing/Procedures: None ordered  Follow-Up: At [facility], you and your health needs are our priority.  As part of our continuing mission to provide you with exceptional heart care, we have created designated Provider Care Teams.  These Care Teams include your primary Cardiologist (physician) and Advanced Practice Providers (APPs -  Physician Assistants and Nurse Practitioners) who all work together to provide you with the care you need, when you need it. You will need a follow up appointment in 4-6  weeks with Dr. Eldridge Dace.  We will have to call you with this appt.  Any Other Special Instructions Will Be Listed Below (If Applicable).

## 2018-04-24 NOTE — Progress Notes (Signed)
04/24/2018 [name]   1933-12-25  [id number]  Primary Physician Gordnier, Skeet Simmer, PA Primary Cardiologist: Dr. Eldridge Dace   Reason for Visit/CC: Post hospital follow-up for CAD status post STEMI, s/p LAD PCI, systolic heart failure, LV thrombus, atrial fibrillation  HPI:  [name] is a 84 y.o. female who is being seen today for post hospital follow-up.  She has had 2 hospitalizations within the past 3 weeks.  She was first admitted to the hospital 04/04/2018 for anterior wall STEMI. Cath showed severe two-vessel disease and she underwent emergent PCI of 100% occluded proximal left circumflex with plans for staged RCA PCI for 95% distal stenosis.  She was also found to have systolic heart failure with an EF of 40% and brief atrial fibrillation.  She was initially placed on dual antiplatelet therapy with aspirin and Brilinta.  She also required IV amiodarone for her atrial fibrillation and later transitioned to p.o. with plans to taper down from 400 mg twice daily x2 weeks to 200 mg twice daily x2 weeks x200 mg daily.  Given the brief episode of A. Fib, it was decided at that time not to anticoagulate.    She was discharged home on 04/08/2018 per but presented back to the emergency department on 10/19 with complaints of shortness of breath and orthopnea.  She was felt to be in acute heart failure.  BNP was elevated at 1,045.  Point-of-care troponin was 0.76, felt to be trending down from her initial MI (troponin had peaked to greater than 65).  She was readmitted to the hospital and started on IV Lasix from diuresis and had good urinary response.  She had a repeat echocardiogram to reassess EF and also to rule out ischemic mitral regurgitation.  Echo showed further reduction of her EF down to 30 to 35%.  There was only mild mitral regurgitation.  There was also noted to be akinesis of the basal and mid inferoseptal, anteroseptal, anterior and apical septal and anterior walls.  There was no  definitive thrombus but heavy smoke consistent with pre-thrombotic state therefore anticoagulation was recommended.  Subsequently, Brilinta was discontinued.  Patient was transitioned to Plavix and Eliquis was started for possible LV thrombus.  Patient was also continued on aspirin with plans to continue for duration of 30 days.  Given concerns for LV thrombus it was decided to postpone plans to do staged PCI of the RCA.  It was decided to treat medically for now and to later reconsider CABG versus PCI.  She is back today with her husband and her daughter.  She feels nauseated, some vomiting recently.  I suspect this is likely due to her amiodarone.  She reports that her breathing has significantly improved.  No further dyspnea, no orthopnea or PND.  She also denies chest pain.  Unfortunately at time of her initial cardiac catheterization she developed significant right forearm hematoma however this is gradually improving.  She denies any palpitations.  Regular rate and rhythm on exam.  She reports full medication compliance.  No abnormal bleeding with Eliquis. Compliant w/ daily weights no abnormal bleeding.    Current Meds  Medication Sig  . apixaban (ELIQUIS) 5 MG TABS tablet Take 1 tablet (5 mg total) by mouth 2 (two) times daily.  Marland Kitchen aspirin 81 MG chewable tablet Chew 1 tablet (81 mg total) by mouth daily.  Marland Kitchen atorvastatin (LIPITOR) 80 MG tablet Take 1 tablet (80 mg total) by mouth daily at 6 PM.  . clopidogrel (PLAVIX) 75 MG  tablet Take 1 tablet (75 mg total) by mouth daily.  . ferrous sulfate 325 (65 FE) MG tablet Take 325 mg by mouth daily with breakfast.  . furosemide (LASIX) 40 MG tablet Take 1 tablet (40 mg total) by mouth daily.  . isosorbide mononitrate (IMDUR) 30 MG 24 hr tablet Take 1 tablet (30 mg total) by mouth daily.  . Magnesium Carbonate (MAGONATE PO) Take 1 tablet by mouth daily.  . metoprolol succinate (TOPROL XL) 25 MG 24 hr tablet Take 1 tablet (25 mg total) by mouth daily.    . Multiple Vitamin (MULTIVITAMIN) capsule Take 1 capsule by mouth daily.  . nitroGLYCERIN (NITROSTAT) 0.4 MG SL tablet Place 1 tablet (0.4 mg total) under the tongue every 5 (five) minutes as needed for chest pain.  . Omega-3 1000 MG CAPS Take 1,000 mg by mouth daily.   Marland Kitchen OVER THE COUNTER MEDICATION Lectin  . spironolactone (ALDACTONE) 25 MG tablet Take 0.5 tablets (12.5 mg total) by mouth daily.  . [DISCONTINUED] amiodarone (PACERONE) 200 MG tablet Take 400mg  (2 tabs) twice a day for 2 weeks, then 200mg  (1 tab) twice a day for 2 weeks, then 200mg  daily.   No Known Allergies Past Medical History:  Diagnosis Date  . Acute systolic heart failure (HCC)   . Hyperlipidemia   . PAF (paroxysmal atrial fibrillation) (HCC)   . STEMI (ST elevation myocardial infarction) (HCC)    04/04/18 PCI/DES to the pLAD, 95% dRCA (possible staged intervention), 75% mLAD, EF 45%   Family History  Problem Relation Age of Onset  . Hypertension Mother    Past Surgical History:  Procedure Laterality Date  . CHOLECYSTECTOMY    . CHOLECYSTECTOMY  08/09/2011   Procedure: LAPAROSCOPIC CHOLECYSTECTOMY;  Surgeon: Robyne Askew, MD;  Location: [facility];  Service: General;  Laterality: N/A;  . CORONARY/GRAFT ACUTE MI REVASCULARIZATION N/A 04/04/2018   Procedure: Coronary/Graft Acute MI Revascularization;  Surgeon: Corky Crafts, MD;  Location: [facility] INVASIVE CV LAB;  Service: Cardiovascular;  Laterality: N/A;  . HERNIA REPAIR    . LEFT HEART CATH AND CORONARY ANGIOGRAPHY N/A 04/04/2018   Procedure: LEFT HEART CATH AND CORONARY ANGIOGRAPHY;  Surgeon: Corky Crafts, MD;  Location: [facility] INVASIVE CV LAB;  Service: Cardiovascular;  Laterality: N/A;  . TUBAL LIGATION     Social History   Socioeconomic History  . Marital status: Married    Spouse name: Not on file  . Number of children: Not on file  . Years of education: Not on file  . Highest education level: Not on file  Occupational History  . Not on file   Social Needs  . Financial resource strain: Not on file  . Food insecurity:    Worry: Not on file    Inability: Not on file  . Transportation needs:    Medical: Not on file    Non-medical: Not on file  Tobacco Use  . Smoking status: Never Smoker  . Smokeless tobacco: Never Used  Substance and Sexual Activity  . Alcohol use: No  . Drug use: No  . Sexual activity: Not on file  Lifestyle  . Physical activity:    Days per week: Not on file    Minutes per session: Not on file  . Stress: Not on file  Relationships  . Social connections:    Talks on phone: Not on file    Gets together: Not on file    Attends religious service: Not on file    Active member  of club or organization: Not on file    Attends meetings of clubs or organizations: Not on file    Relationship status: Not on file  . Intimate partner violence:    Fear of current or ex partner: Not on file    Emotionally abused: Not on file    Physically abused: Not on file    Forced sexual activity: Not on file  Other Topics Concern  . Not on file  Social History Narrative  . Not on file     Review of Systems: General: negative for chills, fever, night sweats or weight changes.  Cardiovascular: negative for chest pain, dyspnea on exertion, edema, orthopnea, palpitations, paroxysmal nocturnal dyspnea or shortness of breath Dermatological: negative for rash Respiratory: negative for cough or wheezing Urologic: negative for hematuria Abdominal: negative for nausea, vomiting, diarrhea, bright red blood per rectum, melena, or hematemesis Neurologic: negative for visual changes, syncope, or dizziness All other systems reviewed and are otherwise negative except as noted above.   Physical Exam:  Blood pressure 138/82, pulse 92, height 5\' 4"  (1.626 m), weight 202 lb 9.6 oz (91.9 kg), SpO2 93 %.  General appearance: alert, cooperative and no distress Neck: no carotid bruit and no JVD Lungs: clear to auscultation  bilaterally Heart: regular rate and rhythm, S1, S2 normal, no murmur, click, rub or gallop Extremities: extremities normal, atraumatic, no cyanosis or edema Pulses: 2+ and symmetric Skin: Skin color, texture, turgor normal. No rashes or lesions Neurologic: Grossly normal  EKG not performed -- personally reviewed   ASSESSMENT AND PLAN:   1. CAD: s/p recent anterior wall STEMI 04/04/18 w/ cath showing severe 2V disease, treated with emergent PCI of 100% occluded pLCx with plans for stage RCA PCI for 95% distal stenosis. Treating medically for now. May later consider CABG. Stable w/o CP. No dyspnea. Continue medical management. Stop ASA after 05/05/18. Will continue Plavix + Eliquis there after. Continue BB and statin.   2. Systolic HF/ Ischemic CM: EF 30-35% s/p MI. Continue BB therapy and aldactone. Her Losartan was held at discharge due to slight bump in SCr. Will recheck BMP. If SCr is ok, we will try adding Entresto. Volume is stable. Continue lasix. We discussed daily weights and low sodium diet. Will need repeat echo after 3 months of guidelines directed medical therapy for systolic HF.   3. Atrial Fibrillation: maintaining NSR on amiodarone. We will titrate down due to her nausea. I suspect this is the primary cause. Will try to reduce down gradually to 200 mg daily. Continue Eliquis.   4. ? LV Thrombus: now on Eliquis. Reports full compliance.   Follow-Up: we will await results of BMP. If I elect to start Entresto, I will have her f/u with pharmacist, 1-2 weeks after initiation. We will have her f/u with Dr. Eldridge Dace in 4-6 weeks to discuss staged RCA PCI vs possible CABG.   Isidra Mings Delmer Islam, MHS [facility] 04/24/2018 1:08 PM

## 2018-04-24 NOTE — Telephone Encounter (Signed)
-----   Message from Allayne Butcher, [place] sent at 04/24/2018  1:18 PM EDT ----- Kidney function is ok. Start Entresto 24/26 mg BID for heart failure. Return to office in 2 weeks for BP check in HTN clinic and repeat BMP.

## 2018-04-25 ENCOUNTER — Other Ambulatory Visit: Payer: Self-pay | Admitting: *Deleted

## 2018-04-26 ENCOUNTER — Telehealth: Payer: Self-pay | Admitting: Physician Assistant

## 2018-04-26 NOTE — Telephone Encounter (Signed)
The patient called the answering service after-hours today (Saturday). She was prescribed Entresto on 10/31 and has not picked up because it is too expensive. Creatinine recently bumped and remained slightly elevated after MI. Over the weekend, I do not have access to patient assistance resources from the office or samples so I advised that I would route to Brittainy and [facility] for review to decide on options for patient.The patient verbalized understanding and gratitude. Jalene Lacko PA-C

## 2018-04-28 ENCOUNTER — Telehealth: Payer: Self-pay | Admitting: Cardiology

## 2018-04-28 ENCOUNTER — Other Ambulatory Visit: Payer: Self-pay | Admitting: *Deleted

## 2018-04-28 NOTE — Telephone Encounter (Signed)
**Note De-Identified  Obfuscation** Letter received  fax from [employer] stating that they have approved the pts [place] PA. Approval good from 04/28/18 until 06/25/2019.  I have notified the pts pharmacy.

## 2018-04-28 NOTE — Patient Outreach (Addendum)
[facility]) Care Management  04/25/2018  [name] [birth year] [id number]    Transition of care  RN attempted outreach call however unsuccessful and unable to leave a message. Call back received and RN spoke with pt concerning [facility] services and the purpose for today's call. Further discussed pt's medical issues (HF) and offered to assist in managing this medical condition. Pt receptive and reports her weights yesterday 202 lbs at her provider's office and today 205 lbs. Also states she is in the GREEN once education discussed concerning HF zones. RN stressed the importance of daily weights due to fluid retention and the affect this can have on her breathing with increase swelling to the hands, trunk or lower legs. Pt will clear understanding on what to do if acute symptoms should occur. RN also discussed pt's discharge medications and pt reports her daughter fills her pill box with all her medications. Pt  did not wish to review her medications at this time but confirms she has all her medications and taking accordingly to what has been recommended.   RN offered home visit however pt feels this is not necessary and aware of the HF zone from past education. RN offered to follow up telephonically for ongoing transition of care weekly over the next month with pt to further assist with managing her HF however pt does not wish to be called weekly but receptive to monthly contacts for ongoing case management of care. Also offered to mail information packet along with a calendar to pt for future managment of her HF, again pt was receptive. No inquires or request at this time as RN will follow few next week to obtain additional information on the initial assessment and inquire further on pt's progress with managing her HF.  TH[facility] CM Care Plan Problem One     Most Recent Value  Care Plan Problem One  Knowledge deficit related to HF  Role Documenting the Problem One  Care Management  Coordinator  Care Plan for Problem One  Active  THN Long Term Goal   Pt will have an increase knowledge base on managing HF over the next 90 days.  [facility] Long Term Goal Start Date  04/25/18  Interventions for Problem One Long Term Goal  Discussed HF zones and what to do if acute symptoms occur. Will send information on HF zones that was discussed today. Will also verify pt remains in the GREEN zone with her ongogn management of care  [facility] CM Short Term Goal #1   Pt will complete daily weights and document all readings for providers to view over the next 30 days.  [facility] CM Short Term Goal #1 Start Date  04/25/18  Interventions for Short Term Goal #1  Will stress the importance of how fuild retention affects her HF and why it is importance to prevent the occurrence related to her HF. Will discussed the risk involved if unmonitored.  [facility] CM Short Term Goal #2   Medication adherence over the next 30 days.  [facility] CM Short Term Goal #2 Start Date  04/25/18  Interventions for Short Term Goal #2  Will offered to review all medications and confirm pt has sufficient refills with all her medications. Will stress the importance of administeration adherence to prevent acute issues from occuring.    [facility] CM Care Plan Problem Two     Most Recent Value  Care Plan Problem Two  Transportantion issues  Role Documenting the Problem Two  Care Management Coordinator  Care Plan for Problem Two  Active  THN CM Short Term Goal #1   Pt will be receptive to consult with [facility] BSW for transportation resources within the next 2 weeks  [facility]N CM Short Term Goal #1 Start Date  04/25/18  Interventions for Short Term Goal #2   Will make a referral for [facility] social worker to follow up with pt for transportation resoruces      Elliot Cousin, RN Care Management Coordinator [facility] Main Office [phone]  Elliot Cousin, RN Care Management Coordinator [facility] Main Office [phone]

## 2018-04-28 NOTE — Addendum Note (Signed)
Addended by: Elliot Cousin D on: 04/28/2018 12:27 PM   Modules accepted: Orders

## 2018-04-28 NOTE — Telephone Encounter (Signed)
Pt called and spoke with Ronie Spies, PA-C 11/2 (see phone note)   Pt was seen 10/31 in office by Robbie Lis, PA-C who after checking lab work started patient on Entresto 24/26 mg BID.  According to documentation from 11/2 patient did not p/u medication d/t cost.  Dayna routed her note to [facility] Via for patient assistance.   Also on 10/31, Amiodarone was decrease to 200 mg BID x 2 weeks and then she is to decrease to once daily.   Spoke with pt who reports she has been taking her medication with food but this does not seem to help.  She reports feeling "yucky" most all the time with a feeling of constant hunger and upset stomach.  Advised I will forward this information to Brittiany and Dr Eldridge Dace for review and further recommendations.  Pt states understanding and was very grateful for the call and information.   Also, of note - no DPR on file for [facility] to speak with daughter Lajoyce Corners.  I called and spoke with patient at her home number as listed.

## 2018-04-28 NOTE — Telephone Encounter (Signed)
New Message   Pt c/o medication issue:  1. Name of Medication: sacubitril-valsartan (ENTRESTO) 24-26 MG  2. How are you currently taking this medication (dosage and times per day)? Take 1 tablet by mouth 2 (two) times daily.  3. Are you having a reaction (difficulty breathing--STAT)? no  4. What is your medication issue? Pt's daughter is calling, states that when they tried to go pick the medication up it was $350 and the pharmacy says medicare rejected prescription due to needing preautorization and wants to know what they should do. Please call   Pt c/o medication issue:  1. Name of Medication: amiodarone (PACERONE) 200 MG tablet  2. How are you currently taking this medication (dosage and times per day)? Take 1 tablet by mouth twice daily for 2 weeks then go down to taking 1 tablet by mouth daily  3. Are you having a reaction (difficulty breathing--STAT)?   4. What is your medication issue? States pt is also having some nausea with this medication and wants to know if she can be prescribed some nausea medicines. Please call

## 2018-04-28 NOTE — Patient Outreach (Signed)
[facility]) Care Management  04/24/2018  [name] Lamy Sep 04, 1933 [id number]    RN attempted to contact pt however unsuccessful and unable to leave a message. Will attempt once again to reach this pt for transition of care.  Elliot Cousin, RN Care Management Coordinator [facility] Main Office [phone]

## 2018-04-28 NOTE — Telephone Encounter (Signed)
**Note De-Identified  Obfuscation** I have done an urgent Entresto PA through covermymeds. Key: [id number]

## 2018-04-28 NOTE — Telephone Encounter (Signed)
Letter received via fax from [employer] stating that they have approved the pts Entresto PA. Approval good from 04/28/18 until 06/25/2019.  I have notified the pts pharmacy. 

## 2018-04-28 NOTE — Telephone Encounter (Signed)
**Note De-identified  Obfuscation** I have done an urgent Entresto PA through covermymeds. Key: [id number] 

## 2018-04-28 NOTE — Patient Outreach (Signed)
[facility]) Care Management  04/24/2018 (late entry)  SADEEL FIDDLER 14-Feb-1934 [id number]    RN attempted outreach call to the pt's provider Dr. Beola Cord via [facility]. Spoke with a representative and explained [facility] services and inability to reach pt via telephone and no response to outreach letter. Inquired on pt's next office visit however office unable to release that information and will inform the provider nurse with case manager's name and CB #. Will awaiting a cb and continue outreach call to this pt for pending [facility] services.   Elliot Cousin, RN Care Management Coordinator [facility] Main Office [phone]

## 2018-04-29 ENCOUNTER — Other Ambulatory Visit: Payer: Self-pay

## 2018-04-29 NOTE — Patient Outreach (Signed)
[facility]) Care Management  04/29/2018  [name] 29-Oct-1933 [id number]   Initial outreach to patient regarding social work referral for assistance with transportation resources.  BSW spoke with [name] today and she denied the need for transportation and reported that her husband and daughter have been getting her to all necessary appointments.  BSW discussed other social work services and she denied the need for these.  BSW is closing case.  Malachy Chamber, BSW Social Worker [phone]

## 2018-04-30 ENCOUNTER — Encounter: Payer: Self-pay | Admitting: *Deleted

## 2018-04-30 DIAGNOSIS — I5043 Acute on chronic combined systolic (congestive) and diastolic (congestive) heart failure: Secondary | ICD-10-CM | POA: Diagnosis not present

## 2018-04-30 DIAGNOSIS — M549 Dorsalgia, unspecified: Secondary | ICD-10-CM | POA: Diagnosis not present

## 2018-04-30 DIAGNOSIS — I480 Paroxysmal atrial fibrillation: Secondary | ICD-10-CM | POA: Diagnosis not present

## 2018-04-30 DIAGNOSIS — G894 Chronic pain syndrome: Secondary | ICD-10-CM | POA: Diagnosis not present

## 2018-04-30 DIAGNOSIS — I255 Ischemic cardiomyopathy: Secondary | ICD-10-CM | POA: Diagnosis not present

## 2018-04-30 DIAGNOSIS — R110 Nausea: Secondary | ICD-10-CM | POA: Diagnosis not present

## 2018-04-30 DIAGNOSIS — Z9861 Coronary angioplasty status: Secondary | ICD-10-CM | POA: Diagnosis not present

## 2018-04-30 DIAGNOSIS — I2510 Atherosclerotic heart disease of native coronary artery without angina pectoris: Secondary | ICD-10-CM | POA: Diagnosis not present

## 2018-04-30 NOTE — Telephone Encounter (Signed)
This encounter was created in error - please disregard.

## 2018-05-05 ENCOUNTER — Telehealth: Payer: Self-pay | Admitting: Interventional Cardiology

## 2018-05-05 ENCOUNTER — Encounter: Payer: Self-pay | Admitting: Interventional Cardiology

## 2018-05-05 MED ORDER — AMIODARONE HCL 200 MG PO TABS: 200.0000 mg | ORAL_TABLET | Freq: Every day | ORAL | 0 refills | Status: DC

## 2018-05-05 NOTE — Telephone Encounter (Signed)
Returned call to [employer] (DPR on file). She states that the patient has continued to have nausea with the amiodarone. She states that she has 1 more week of taking 200 mg BID and is suppose to then decrease it to 200 mg QD. Lajoyce Corners states that the patient has been taking the medicine with food. She denies any irregular heart beats, SOB, or any other Sx. Patient has been compliant with Eliquis. Will discuss with Robbie Lis, PA to see if she is okay with the patient going ahead and decreasing amio to 200 mg QD to see if this helps to improve her nausea. Patient has an appointment with Dr. Eldridge Dace on 11/14.

## 2018-05-05 NOTE — Telephone Encounter (Signed)
New Message:    Patient daughter calling her mother is still being nauseated every day. She seems to think it may some medication issue not sure.

## 2018-05-05 NOTE — Telephone Encounter (Signed)
Called and made Ivy aware that Robbie Lis, PA was okay with the patient decreasing her amio to 200 mg QD. Patient will keep her appointment with Dr. Eldridge Dace on 11/14.

## 2018-05-06 DIAGNOSIS — I255 Ischemic cardiomyopathy: Secondary | ICD-10-CM | POA: Insufficient documentation

## 2018-05-06 DIAGNOSIS — I5022 Chronic systolic (congestive) heart failure: Secondary | ICD-10-CM | POA: Insufficient documentation

## 2018-05-06 DIAGNOSIS — Z9861 Coronary angioplasty status: Secondary | ICD-10-CM

## 2018-05-06 DIAGNOSIS — I2510 Atherosclerotic heart disease of native coronary artery without angina pectoris: Secondary | ICD-10-CM | POA: Insufficient documentation

## 2018-05-07 NOTE — Progress Notes (Signed)
Cardiology Office Note   Date:  05/08/2018   ID:  Kelly HolsterSandra S Gienger, DOB 09/14/1933, MRN [id number]  PCP:  Daylene KatayamaGordnier, [name], PA    No chief complaint on file.  CAD  Wt Readings from Last 3 Encounters:  05/08/18 200 lb (90.7 kg)  04/24/18 202 lb 9.6 oz (91.9 kg)  04/16/18 204 lb 4.8 oz (92.7 kg)       History of Present Illness: [name] is a 84 y.o. female  was first admitted to the hospital 04/04/2018 for anterior wall STEMI. Cath showed severe two-vessel disease and she underwent emergent PCI of 100% occluded proximal left circumflex with plans for staged RCA PCI for 95% distal stenosis.  She was also found to have systolic heart failure with an EF of 40% and brief atrial fibrillation.  She was initially placed on dual antiplatelet therapy with aspirin and Brilinta.  She also required IV amiodarone for her atrial fibrillation and later transitioned to p.o. with plans to taper down from 400 mg twice daily x2 weeks to 200 mg twice daily x2 weeks x200 mg daily.  Given the brief episode of A. Fib, it was decided at that time not to anticoagulate.    She was discharged home on 04/08/2018 per but presented back to the emergency department on 10/19 with complaints of shortness of breath and orthopnea.  She was felt to be in acute heart failure.  BNP was elevated at 1,045.  Point-of-care troponin was 0.76, felt to be trending down from her initial MI (troponin had peaked to greater than 65).  She was readmitted to the hospital and started on IV Lasix from diuresis and had good urinary response.  She had a repeat echocardiogram to reassess EF and also to rule out ischemic mitral regurgitation.  Echo showed further reduction of her EF down to 30 to 35%.  There was only mild mitral regurgitation.  There was also noted to be akinesis of the basal and mid inferoseptal, anteroseptal, anterior and apical septal and anterior walls.  There was no definitive thrombus but heavy smoke consistent  with pre-thrombotic state therefore anticoagulation was recommended.  Subsequently, Brilinta was discontinued.  Patient was transitioned to Plavix and Eliquis was started for possible LV thrombus.  Patient was also continued on aspirin with plans to continue for duration of 30 days.  Given concerns for LV thrombus it was decided to postpone plans to do staged PCI of the RCA.  It was decided to treat medically for now and to later reconsider CABG versus PCI.  She reported nausea a few weeks ago, thought secondary to amiodarone.  This was recently decreased to 200 mg daily.     She has back pain and the nausea is worse when the back pain is worse.   He has not eaten much.  THis is their big concern today.  Denies : Chest pain. Dizziness. Leg edema. Nitroglycerin use. Orthopnea. Palpitations. Paroxysmal nocturnal dyspnea. Shortness of breath. Syncope.   She is not interested in any procedure at this time for her heart.  Her concerns are her back pain and her nausea.  Tylenol seems to help her back pain.   No bleeding issues.   Past Medical History:  Diagnosis Date  . Acute systolic heart failure (HCC)   . Hyperlipidemia   . PAF (paroxysmal atrial fibrillation) (HCC)   . STEMI (ST elevation myocardial infarction) (HCC)    04/04/18 PCI/DES to the pLAD, 95% dRCA (possible staged intervention), 75% mLAD,  EF 45%    Past Surgical History:  Procedure Laterality Date  . CHOLECYSTECTOMY    . CHOLECYSTECTOMY  08/09/2011   Procedure: LAPAROSCOPIC CHOLECYSTECTOMY;  Surgeon: Robyne Askew, MD;  Location: [facility];  Service: General;  Laterality: N/A;  . CORONARY/GRAFT ACUTE MI REVASCULARIZATION N/A 04/04/2018   Procedure: Coronary/Graft Acute MI Revascularization;  Surgeon: Corky Crafts, MD;  Location: [facility] INVASIVE CV LAB;  Service: Cardiovascular;  Laterality: N/A;  . HERNIA REPAIR    . LEFT HEART CATH AND CORONARY ANGIOGRAPHY N/A 04/04/2018   Procedure: LEFT HEART CATH AND CORONARY  ANGIOGRAPHY;  Surgeon: Corky Crafts, MD;  Location: [facility] INVASIVE CV LAB;  Service: Cardiovascular;  Laterality: N/A;  . TUBAL LIGATION       Current Outpatient Medications  Medication Sig Dispense Refill  . amiodarone (PACERONE) 200 MG tablet Take 1 tablet (200 mg total) by mouth daily. 90 tablet 0  . apixaban (ELIQUIS) 5 MG TABS tablet Take 1 tablet (5 mg total) by mouth 2 (two) times daily. 60 tablet 2  . atorvastatin (LIPITOR) 80 MG tablet Take 1 tablet (80 mg total) by mouth daily at 6 PM. 30 tablet 2  . clopidogrel (PLAVIX) 75 MG tablet Take 1 tablet (75 mg total) by mouth daily. 90 tablet 2  . DULoxetine (CYMBALTA) 20 MG capsule Take 20 mg by mouth as needed for pain.    . furosemide (LASIX) 40 MG tablet Take 1 tablet (40 mg total) by mouth daily. 30 tablet 2  . isosorbide mononitrate (IMDUR) 30 MG 24 hr tablet Take 1 tablet (30 mg total) by mouth daily. 30 tablet 2  . metoprolol succinate (TOPROL XL) 25 MG 24 hr tablet Take 1 tablet (25 mg total) by mouth daily. 30 tablet 1  . nitroGLYCERIN (NITROSTAT) 0.4 MG SL tablet Place 1 tablet (0.4 mg total) under the tongue every 5 (five) minutes as needed for chest pain. 25 tablet 2  . ondansetron (ZOFRAN-ODT) 4 MG disintegrating tablet Take 4 mg by mouth as needed for nausea/vomiting.  0  . sacubitril-valsartan (ENTRESTO) 24-26 MG Take 1 tablet by mouth 2 (two) times daily. 28 tablet 0  . spironolactone (ALDACTONE) 25 MG tablet Take 0.5 tablets (12.5 mg total) by mouth daily. 30 tablet 2  . traMADol (ULTRAM) 50 MG tablet Take 50 mg by mouth as needed for pain.     No current facility-administered medications for this visit.     Allergies:   Patient has no known allergies.    Social History:  The patient  reports that she has never smoked. She has never used smokeless tobacco. She reports that she does not drink alcohol or use drugs.   Family History:  The patient's family history includes Hypertension in her mother.    ROS:   Please see the history of present illness.   Otherwise, review of systems are positive for nausea.   All other systems are reviewed and negative.    PHYSICAL EXAM: VS:  BP 122/74   Pulse 85   Ht 5\' 4"  (1.626 m)   Wt 200 lb (90.7 kg)   SpO2 98%   BMI 34.33 kg/m  , BMI Body mass index is 34.33 kg/m. GEN: Well nourished, well developed, in no acute distress , frail HEENT: normal  Neck: no JVD, carotid bruits, or masses Cardiac: RRR; no murmurs, rubs, or gallops,no edema  Respiratory:  clear to auscultation bilaterally, normal work of breathing GI: soft, nontender, nondistended, + BS MS: no deformity  or atrophy  Skin: warm and dry, no rash Neuro:  Strength and sensation are intact Psych: euthymic mood, full affect     Recent Labs: 04/04/2018: ALT 12 04/12/2018: B Natriuretic Peptide 1,045.0; Hemoglobin 11.4; Platelets 226 04/15/2018: Magnesium 1.9 04/24/2018: BUN 32; Creatinine, Ser 1.19; Potassium 4.0; Sodium 136   Lipid Panel    Component Value Date/Time   CHOL 230 (H) 04/04/2018 1248   TRIG 271 (H) 04/04/2018 1248   HDL 50 04/04/2018 1248   CHOLHDL 4.6 04/04/2018 1248   VLDL 54 (H) 04/04/2018 1248   LDLCALC 126 (H) 04/04/2018 1248     Other studies Reviewed: Additional studies/ records that were reviewed today with results demonstrating: hospital records.   ASSESSMENT AND PLAN:  1. CAD/ Old Anterior MI: REsidual RCA disease.  She is not a good candidate for CABG. She is not interested in any revascularization at this time given her lack of cardiac sx.  Will continue medical therapy at this time  2. Chronic systolic heart failure: Entresto started. Will not increase dose today given the nausea.  Check labs today.  3. Hyperlipidemia: High dose statin. 4. No LV thrombus but severe apical smoke noted on echo: Anticoagulated with Eliquis for this.  Plan was for 3 months of treatment. Given the nausea, will stop Eliquis since that was started in the second hospital stay,  which is when the nausea started.  If nausea persists, the next step would be to change Plavix back to Brilinta.  Would continue to hold Eliquis.   5. COntinue Amio at low dose.  She may not tolerate AFib well due to CAD and low EF.  THerefore, want to try to maintain NSR.   6. Family expressed some concern that she was on all natural supplements prior to MI and bnow she is on so many prescription meds.   Current medicines are reviewed at length with the patient today.  The patient concerns regarding her medicines were addressed.  The following changes have been made:  No change  Labs/ tests ordered today include:  No orders of the defined types were placed in this encounter.   Recommend 150 minutes/week of aerobic exercise Low fat, low carb, high fiber diet recommended  Disposition:   FU in 3 months   Signed, Lance Muss, MD  05/08/2018 11:53 AM    [facility] [address] [place], [place], [place]  [address] Phone: [phone]; Fax: [phone]

## 2018-05-08 ENCOUNTER — Encounter: Payer: Self-pay | Admitting: Interventional Cardiology

## 2018-05-08 ENCOUNTER — Ambulatory Visit: Payer: PPO | Admitting: Cardiology

## 2018-05-08 ENCOUNTER — Ambulatory Visit (INDEPENDENT_AMBULATORY_CARE_PROVIDER_SITE_OTHER): Payer: PPO | Admitting: Interventional Cardiology

## 2018-05-08 VITALS — BP 122/74 | HR 85 | Ht 64.0 in | Wt 200.0 lb

## 2018-05-08 DIAGNOSIS — R110 Nausea: Secondary | ICD-10-CM

## 2018-05-08 DIAGNOSIS — I5022 Chronic systolic (congestive) heart failure: Secondary | ICD-10-CM

## 2018-05-08 DIAGNOSIS — Z7901 Long term (current) use of anticoagulants: Secondary | ICD-10-CM

## 2018-05-08 DIAGNOSIS — E782 Mixed hyperlipidemia: Secondary | ICD-10-CM | POA: Diagnosis not present

## 2018-05-08 DIAGNOSIS — I25118 Atherosclerotic heart disease of native coronary artery with other forms of angina pectoris: Secondary | ICD-10-CM | POA: Diagnosis not present

## 2018-05-08 MED ORDER — NITROGLYCERIN 0.4 MG SL SUBL: 0.4000 mg | SUBLINGUAL_TABLET | SUBLINGUAL | 2 refills | Status: DC | PRN

## 2018-05-08 MED ORDER — METOPROLOL SUCCINATE ER 25 MG PO TB24: 25.0000 mg | ORAL_TABLET | Freq: Every day | ORAL | 1 refills | Status: DC

## 2018-05-08 MED ORDER — SPIRONOLACTONE 25 MG PO TABS: 12.5000 mg | ORAL_TABLET | Freq: Every day | ORAL | 1 refills | Status: DC

## 2018-05-08 MED ORDER — CLOPIDOGREL BISULFATE 75 MG PO TABS: 75.0000 mg | ORAL_TABLET | Freq: Every day | ORAL | 1 refills | Status: DC

## 2018-05-08 MED ORDER — FUROSEMIDE 40 MG PO TABS: 40.0000 mg | ORAL_TABLET | Freq: Every day | ORAL | 1 refills | Status: DC

## 2018-05-08 MED ORDER — ATORVASTATIN CALCIUM 80 MG PO TABS: 80.0000 mg | ORAL_TABLET | Freq: Every day | ORAL | 1 refills | Status: DC

## 2018-05-08 MED ORDER — ISOSORBIDE MONONITRATE ER 30 MG PO TB24: 30.0000 mg | ORAL_TABLET | Freq: Every day | ORAL | 1 refills | Status: DC

## 2018-05-08 NOTE — Patient Instructions (Addendum)
Medication Instructions:  Your physician has recommended you make the following change in your medication:   1. STOP: Eliquis  2. CONTINUE: amiodarone 200 mg once daily  LET US KNOW IF YOUR NAUSEA DOES NOT IMPROVE  If you need a refill on your cardiac medications before your next appointment, please call your pharmacy.   Lab work: TODAY: BMET, TSH  If you have labs (blood work) drawn today and your tests are completely normal, you will receive your results only by: Marland Kitchen. MyChart Message (if you have MyChart) OR . A paper copy in the mail If you have any lab test that is abnormal or we need to change your treatment, we will call you to review the results.  Testing/Procedures: None ordered  Follow-Up: At [facility], you and your health needs are our priority.  As part of our continuing mission to provide you with exceptional heart care, we have created designated Provider Care Teams.  These Care Teams include your primary Cardiologist (physician) and Advanced Practice Providers (APPs -  Physician Assistants and Nurse Practitioners) who all work together to provide you with the care you need, when you need it. . You will need a follow up appointment in 3 months. You may see Everette RankJay Varanasi, MD or one of the following Advanced Practice Providers on your designated Care Team:   . Robbie LisBrittainy Simmons, PA-C . Dayna Dunn, PA-C . Jacolyn ReedyMichele Lenze, PA-C  Any Other Special Instructions Will Be Listed Below (If Applicable).

## 2018-05-09 LAB — BASIC METABOLIC PANEL
BUN / CREAT RATIO: 21 (ref 12–28)
BUN: 34 mg/dL — AB (ref 8–27)
CHLORIDE: 99 mmol/L (ref 96–106)
CO2: 22 mmol/L (ref 20–29)
Calcium: 9.5 mg/dL (ref 8.7–10.3)
Creatinine, Ser: 1.61 mg/dL — ABNORMAL HIGH (ref 0.57–1.00)
GFR calc non Af Amer: 30 mL/min/{1.73_m2} — ABNORMAL LOW (ref >59)
GFR, EST AFRICAN AMERICAN: 34 mL/min/{1.73_m2} — AB (ref >59)
GLUCOSE: 149 mg/dL — AB (ref 65–99)
POTASSIUM: 4.2 mmol/L (ref 3.5–5.2)
Sodium: 143 mmol/L (ref 134–144)

## 2018-05-09 LAB — TSH: TSH: 1.47 u[IU]/mL (ref 0.450–4.500)

## 2018-05-11 ENCOUNTER — Other Ambulatory Visit: Payer: Self-pay

## 2018-05-13 ENCOUNTER — Telehealth: Payer: Self-pay

## 2018-05-13 DIAGNOSIS — I5022 Chronic systolic (congestive) heart failure: Secondary | ICD-10-CM

## 2018-05-13 MED ORDER — LOSARTAN POTASSIUM 25 MG PO TABS: 25.0000 mg | ORAL_TABLET | Freq: Every day | ORAL | 3 refills | Status: DC

## 2018-05-13 NOTE — Telephone Encounter (Signed)
Called and spoke to daughter Lajoyce Cornersvy (DPR on file) and made her aware of lab results. Instructed for patient to stop entresto and start losartan 25 mg QD. Patient will return for BMET on 12/2. Daughter verbalized understanding and thanked me for the call.

## 2018-05-14 ENCOUNTER — Inpatient Hospital Stay (HOSPITAL_COMMUNITY)
Admission: EM | Admit: 2018-05-14 | Discharge: 2018-05-17 | DRG: 682 | Disposition: A | Discharge status: Home / Self Care | Payer: PPO | Attending: Internal Medicine | Admitting: Internal Medicine

## 2018-05-14 ENCOUNTER — Emergency Department (HOSPITAL_COMMUNITY): Payer: PPO

## 2018-05-14 ENCOUNTER — Encounter (HOSPITAL_COMMUNITY): Payer: Self-pay

## 2018-05-14 ENCOUNTER — Other Ambulatory Visit: Payer: Self-pay

## 2018-05-14 DIAGNOSIS — N179 Acute kidney failure, unspecified: Principal | ICD-10-CM | POA: Diagnosis present

## 2018-05-14 DIAGNOSIS — N183 Chronic kidney disease, stage 3 unspecified: Secondary | ICD-10-CM

## 2018-05-14 DIAGNOSIS — I25118 Atherosclerotic heart disease of native coronary artery with other forms of angina pectoris: Secondary | ICD-10-CM | POA: Diagnosis not present

## 2018-05-14 DIAGNOSIS — Z955 Presence of coronary angioplasty implant and graft: Secondary | ICD-10-CM | POA: Diagnosis not present

## 2018-05-14 DIAGNOSIS — I252 Old myocardial infarction: Secondary | ICD-10-CM | POA: Diagnosis not present

## 2018-05-14 DIAGNOSIS — R110 Nausea: Secondary | ICD-10-CM | POA: Diagnosis not present

## 2018-05-14 DIAGNOSIS — Z79899 Other long term (current) drug therapy: Secondary | ICD-10-CM | POA: Diagnosis not present

## 2018-05-14 DIAGNOSIS — I5043 Acute on chronic combined systolic (congestive) and diastolic (congestive) heart failure: Secondary | ICD-10-CM | POA: Diagnosis not present

## 2018-05-14 DIAGNOSIS — K5900 Constipation, unspecified: Secondary | ICD-10-CM | POA: Diagnosis not present

## 2018-05-14 DIAGNOSIS — E782 Mixed hyperlipidemia: Secondary | ICD-10-CM | POA: Diagnosis not present

## 2018-05-14 DIAGNOSIS — I2510 Atherosclerotic heart disease of native coronary artery without angina pectoris: Secondary | ICD-10-CM | POA: Diagnosis not present

## 2018-05-14 DIAGNOSIS — Z66 Do not resuscitate: Secondary | ICD-10-CM | POA: Diagnosis not present

## 2018-05-14 DIAGNOSIS — Z888 Allergy status to other drugs, medicaments and biological substances status: Secondary | ICD-10-CM

## 2018-05-14 DIAGNOSIS — R5383 Other fatigue: Secondary | ICD-10-CM | POA: Diagnosis not present

## 2018-05-14 DIAGNOSIS — E860 Dehydration: Secondary | ICD-10-CM | POA: Diagnosis present

## 2018-05-14 DIAGNOSIS — R0902 Hypoxemia: Secondary | ICD-10-CM | POA: Diagnosis not present

## 2018-05-14 DIAGNOSIS — I255 Ischemic cardiomyopathy: Secondary | ICD-10-CM

## 2018-05-14 DIAGNOSIS — N289 Disorder of kidney and ureter, unspecified: Secondary | ICD-10-CM

## 2018-05-14 DIAGNOSIS — R112 Nausea with vomiting, unspecified: Secondary | ICD-10-CM | POA: Diagnosis present

## 2018-05-14 DIAGNOSIS — Z7902 Long term (current) use of antithrombotics/antiplatelets: Secondary | ICD-10-CM

## 2018-05-14 DIAGNOSIS — I951 Orthostatic hypotension: Secondary | ICD-10-CM

## 2018-05-14 DIAGNOSIS — R531 Weakness: Secondary | ICD-10-CM | POA: Diagnosis not present

## 2018-05-14 DIAGNOSIS — E785 Hyperlipidemia, unspecified: Secondary | ICD-10-CM

## 2018-05-14 DIAGNOSIS — Z6834 Body mass index (BMI) 34.0-34.9, adult: Secondary | ICD-10-CM

## 2018-05-14 DIAGNOSIS — E669 Obesity, unspecified: Secondary | ICD-10-CM | POA: Diagnosis not present

## 2018-05-14 DIAGNOSIS — I5022 Chronic systolic (congestive) heart failure: Secondary | ICD-10-CM | POA: Diagnosis not present

## 2018-05-14 DIAGNOSIS — I480 Paroxysmal atrial fibrillation: Secondary | ICD-10-CM | POA: Diagnosis present

## 2018-05-14 DIAGNOSIS — K5730 Diverticulosis of large intestine without perforation or abscess without bleeding: Secondary | ICD-10-CM | POA: Diagnosis not present

## 2018-05-14 DIAGNOSIS — R42 Dizziness and giddiness: Secondary | ICD-10-CM | POA: Diagnosis not present

## 2018-05-14 DIAGNOSIS — M545 Low back pain: Secondary | ICD-10-CM | POA: Diagnosis not present

## 2018-05-14 DIAGNOSIS — I959 Hypotension, unspecified: Secondary | ICD-10-CM | POA: Diagnosis not present

## 2018-05-14 DIAGNOSIS — I5042 Chronic combined systolic (congestive) and diastolic (congestive) heart failure: Secondary | ICD-10-CM | POA: Diagnosis not present

## 2018-05-14 DIAGNOSIS — Z9861 Coronary angioplasty status: Secondary | ICD-10-CM | POA: Diagnosis not present

## 2018-05-14 HISTORY — DX: Atherosclerotic heart disease of native coronary artery without angina pectoris: I25.10

## 2018-05-14 HISTORY — DX: Heart failure, unspecified: I50.9

## 2018-05-14 HISTORY — DX: Unspecified osteoarthritis, unspecified site: M19.90

## 2018-05-14 LAB — CBC WITH DIFFERENTIAL/PLATELET
Abs Immature Granulocytes: 0.02 10*3/uL (ref 0.00–0.07)
Basophils Absolute: 0 10*3/uL (ref 0.0–0.1)
Basophils Relative: 0 %
EOS ABS: 0.1 10*3/uL (ref 0.0–0.5)
EOS PCT: 1 %
HCT: 40.7 % (ref 36.0–46.0)
HEMOGLOBIN: 12.6 g/dL (ref 12.0–15.0)
Immature Granulocytes: 0 %
LYMPHS PCT: 20 %
Lymphs Abs: 1.9 10*3/uL (ref 0.7–4.0)
MCH: 30.3 pg (ref 26.0–34.0)
MCHC: 31 g/dL (ref 30.0–36.0)
MCV: 97.8 fL (ref 80.0–100.0)
Monocytes Absolute: 1 10*3/uL (ref 0.1–1.0)
Monocytes Relative: 11 %
Neutro Abs: 6.3 10*3/uL (ref 1.7–7.7)
Neutrophils Relative %: 68 %
Platelets: 178 10*3/uL (ref 150–400)
RBC: 4.16 MIL/uL (ref 3.87–5.11)
RDW: 14.7 % (ref 11.5–15.5)
WBC: 9.4 10*3/uL (ref 4.0–10.5)
nRBC: 0 % (ref 0.0–0.2)

## 2018-05-14 LAB — COMPREHENSIVE METABOLIC PANEL
ALT: 23 U/L (ref 0–44)
ANION GAP: 12 (ref 5–15)
AST: 21 U/L (ref 15–41)
Albumin: 3.4 g/dL — ABNORMAL LOW (ref 3.5–5.0)
Alkaline Phosphatase: 52 U/L (ref 38–126)
BUN: 29 mg/dL — AB (ref 8–23)
CALCIUM: 8.7 mg/dL — AB (ref 8.9–10.3)
CHLORIDE: 103 mmol/L (ref 98–111)
CO2: 24 mmol/L (ref 22–32)
CREATININE: 1.86 mg/dL — AB (ref 0.44–1.00)
GFR, EST AFRICAN AMERICAN: 28 mL/min — AB (ref >60)
GFR, EST NON AFRICAN AMERICAN: 24 mL/min — AB (ref >60)
Glucose, Bld: 96 mg/dL (ref 70–99)
POTASSIUM: 3.5 mmol/L (ref 3.5–5.1)
SODIUM: 139 mmol/L (ref 135–145)
Total Bilirubin: 0.8 mg/dL (ref 0.3–1.2)
Total Protein: 6.6 g/dL (ref 6.5–8.1)

## 2018-05-14 LAB — I-STAT TROPONIN, ED: TROPONIN I, POC: 0 ng/mL (ref 0.00–0.08)

## 2018-05-14 LAB — BRAIN NATRIURETIC PEPTIDE: B Natriuretic Peptide: 337.1 pg/mL — ABNORMAL HIGH (ref 0.0–100.0)

## 2018-05-14 MED ORDER — PROMETHAZINE HCL 25 MG/ML IJ SOLN
12.5000 mg | Freq: Once | INTRAMUSCULAR | Status: AC
  Administered 2018-05-14: 12.5 mg via INTRAVENOUS
  Filled 2018-05-14: qty 1

## 2018-05-14 MED ORDER — ENOXAPARIN SODIUM 30 MG/0.3ML ~~LOC~~ SOLN
30.0000 mg | SUBCUTANEOUS | Status: DC
  Administered 2018-05-15 – 2018-05-16 (×2): 30 mg via SUBCUTANEOUS
  Filled 2018-05-14 (×2): qty 0.3

## 2018-05-14 MED ORDER — CLOPIDOGREL BISULFATE 75 MG PO TABS
75.0000 mg | ORAL_TABLET | Freq: Every day | ORAL | Status: DC
  Administered 2018-05-14 – 2018-05-15 (×2): 75 mg via ORAL
  Filled 2018-05-14 (×2): qty 1

## 2018-05-14 MED ORDER — CLOPIDOGREL BISULFATE 75 MG PO TABS: 75.0000 mg | ORAL_TABLET | Freq: Once | ORAL | Status: DC

## 2018-05-14 MED ORDER — METOPROLOL SUCCINATE ER 25 MG PO TB24
25.0000 mg | ORAL_TABLET | Freq: Every day | ORAL | Status: DC
  Filled 2018-05-14: qty 1

## 2018-05-14 MED ORDER — METOPROLOL SUCCINATE ER 25 MG PO TB24
25.0000 mg | ORAL_TABLET | Freq: Every day | ORAL | Status: DC
  Administered 2018-05-14 – 2018-05-16 (×3): 25 mg via ORAL
  Filled 2018-05-14 (×2): qty 1

## 2018-05-14 MED ORDER — SODIUM CHLORIDE 0.9 % IV SOLN: INTRAVENOUS | Status: AC

## 2018-05-14 MED ORDER — SODIUM CHLORIDE 0.9 % IV SOLN
INTRAVENOUS | Status: DC
  Administered 2018-05-14: 21:00:00 via INTRAVENOUS

## 2018-05-14 MED ORDER — ACETAMINOPHEN 325 MG PO TABS
650.0000 mg | ORAL_TABLET | Freq: Once | ORAL | Status: AC
  Administered 2018-05-14: 650 mg via ORAL
  Filled 2018-05-14: qty 2

## 2018-05-14 MED ORDER — ACETAMINOPHEN 325 MG PO TABS
650.0000 mg | ORAL_TABLET | Freq: Four times a day (QID) | ORAL | Status: DC | PRN
  Administered 2018-05-16 – 2018-05-17 (×3): 650 mg via ORAL
  Filled 2018-05-14 (×3): qty 2

## 2018-05-14 MED ORDER — CLOPIDOGREL BISULFATE 75 MG PO TABS: 75.0000 mg | ORAL_TABLET | Freq: Every day | ORAL | Status: DC

## 2018-05-14 MED ORDER — PROMETHAZINE HCL 25 MG/ML IJ SOLN
12.5000 mg | Freq: Four times a day (QID) | INTRAMUSCULAR | Status: DC | PRN
  Administered 2018-05-15: 12.5 mg via INTRAVENOUS
  Filled 2018-05-14: qty 1

## 2018-05-14 MED ORDER — SODIUM CHLORIDE 0.9 % IV BOLUS
500.0000 mL | Freq: Once | INTRAVENOUS | Status: AC
  Administered 2018-05-14: 500 mL via INTRAVENOUS

## 2018-05-14 NOTE — ED Triage Notes (Signed)
Pt from dr's office; c/o n/v/dizziness; dizziness since yesterday; n/v x 4 weeks after receiving new meds (pt could not specify which meds); positive orthostatics w/ ems; 122/82 sitting, 98/58 standing; 4 mg zofran and 500 bolus PTA; hx MI 5 weeks ago w/ stent placement  112/62 HR 74 RR 20 94% CBG 133

## 2018-05-14 NOTE — ED Notes (Signed)
Pt's O2 sats observed to be in the 80's on room air; pt placed on 2L O2 via [place]; O2 sats now in the 90's 

## 2018-05-14 NOTE — ED Notes (Signed)
Pt transported to CT ?

## 2018-05-14 NOTE — ED Provider Notes (Signed)
[facility] EMERGENCY DEPARTMENT Provider Note   CSN: [id number] Arrival date & time: 05/14/18  1139     History   Chief Complaint Chief Complaint  Patient presents with  . Dizziness  . Nausea  . Emesis    HPI [name] is a 84 y.o. female with a past medical history of prior anterior MI diagnosed on 04/04/18 s/p stent placement, PAF, CHF who presents to ED for evaluation of 4-week history of nausea, several episodes of nonbloody, nonbilious emesis, food aversion.  States that she has had intermittent dizziness for this time as well.  States that her dizziness got worse last night when she was laying on her bed.  She felt like the bed was "flipping me around."  She then called her cardiologist.  She was told to discontinue her Entresto and start losartan which she has not yet done so since they told her this last night.  She is also switched from taking Eliquis to only Plavix.  She is also recently decreased her dose of amiodarone.  These were all recommendations by her cardiologist.  However, daughter at bedside is concerned because "she went from taking no medicines at all times taking all of these after her heart attack."  They believe the medications are what is causing her symptoms.  During her hospitalization for her MI, patient was "eating and drinking everything."  She denies any specific abdominal pain, chest pain, fever, sick contacts, shortness of breath, leg swelling, fever, headache, vision changes, injuries or falls. She does note that she has had decreased bowel movements.  She used an enema and use several laxatives about 2 days ago which caused her to have diarrhea. She also notes that she has a prescription for Zofran ODT at home.  States that this is not helping her symptoms.  HPI  Past Medical History:  Diagnosis Date  . Acute systolic heart failure (HCC)   . Hyperlipidemia   . PAF (paroxysmal atrial fibrillation) (HCC)   . STEMI (ST elevation  myocardial infarction) (HCC)    04/04/18 PCI/DES to the pLAD, 95% dRCA (possible staged intervention), 75% mLAD, EF 45%    Patient Active Problem List   Diagnosis Date Noted  . Acute CHF (congestive heart failure) (HCC) 04/12/2018  . Acute on chronic combined systolic and diastolic congestive heart failure (HCC) 04/12/2018  . PAF (paroxysmal atrial fibrillation) (HCC) 04/08/2018  . Acute systolic heart failure (HCC) 04/08/2018  . Hyperlipidemia 04/08/2018  . Acute anterior wall MI (HCC) 04/04/2018  . Cholelithiasis with acute or chronic cholecystitis 08/10/2011  . Obesity (BMI 30.0-34.9) 08/10/2011    Past Surgical History:  Procedure Laterality Date  . CHOLECYSTECTOMY    . CHOLECYSTECTOMY  08/09/2011   Procedure: LAPAROSCOPIC CHOLECYSTECTOMY;  Surgeon: Robyne Askew, MD;  Location: [facility] ORS;  Service: General;  Laterality: N/A;  . CORONARY/GRAFT ACUTE MI REVASCULARIZATION N/A 04/04/2018   Procedure: Coronary/Graft Acute MI Revascularization;  Surgeon: Corky Crafts, MD;  Location: [facility] INVASIVE CV LAB;  Service: Cardiovascular;  Laterality: N/A;  . HERNIA REPAIR    . LEFT HEART CATH AND CORONARY ANGIOGRAPHY N/A 04/04/2018   Procedure: LEFT HEART CATH AND CORONARY ANGIOGRAPHY;  Surgeon: Corky Crafts, MD;  Location: [facility] INVASIVE CV LAB;  Service: Cardiovascular;  Laterality: N/A;  . TUBAL LIGATION       OB History   None      Home Medications    Prior to Admission medications   Medication Sig Start Date  End Date Taking? Authorizing Provider  amiodarone (PACERONE) 200 MG tablet Take 1 tablet (200 mg total) by mouth daily. 05/05/18   Robbie Lis M, PA-C  atorvastatin (LIPITOR) 80 MG tablet Take 1 tablet (80 mg total) by mouth daily at 6 PM. 05/08/18   Corky Crafts, MD  clopidogrel (PLAVIX) 75 MG tablet Take 1 tablet (75 mg total) by mouth daily. 05/08/18   Corky Crafts, MD  DULoxetine (CYMBALTA) 20 MG capsule Take 20 mg by mouth as needed for  pain. 04/30/18   [provider]  furosemide (LASIX) 40 MG tablet Take 1 tablet (40 mg total) by mouth daily. 05/08/18   Corky Crafts, MD  isosorbide mononitrate (IMDUR) 30 MG 24 hr tablet Take 1 tablet (30 mg total) by mouth daily. 05/08/18   Corky Crafts, MD  losartan (COZAAR) 25 MG tablet Take 1 tablet (25 mg total) by mouth daily. 05/13/18   Corky Crafts, MD  metoprolol succinate (TOPROL XL) 25 MG 24 hr tablet Take 1 tablet (25 mg total) by mouth daily. 05/08/18   Corky Crafts, MD  nitroGLYCERIN (NITROSTAT) 0.4 MG SL tablet Place 1 tablet (0.4 mg total) under the tongue every 5 (five) minutes as needed for chest pain. 05/08/18   Corky Crafts, MD  ondansetron (ZOFRAN-ODT) 4 MG disintegrating tablet Take 4 mg by mouth as needed for nausea/vomiting. 04/30/18   [provider]  spironolactone (ALDACTONE) 25 MG tablet Take 0.5 tablets (12.5 mg total) by mouth daily. 05/08/18   Corky Crafts, MD  traMADol (ULTRAM) 50 MG tablet Take 50 mg by mouth as needed for pain. 04/30/18   [provider]    Family History Family History  Problem Relation Age of Onset  . Hypertension Mother     Social History Social History   Tobacco Use  . Smoking status: Never Smoker  . Smokeless tobacco: Never Used  Substance Use Topics  . Alcohol use: No  . Drug use: No     Allergies   Patient has no known allergies.   Review of Systems Review of Systems  Constitutional: Negative for appetite change, chills and fever.  HENT: Negative for ear pain, rhinorrhea, sneezing and sore throat.   Eyes: Negative for photophobia and visual disturbance.  Respiratory: Negative for cough, chest tightness, shortness of breath and wheezing.   Cardiovascular: Negative for chest pain and palpitations.  Gastrointestinal: Positive for nausea and vomiting. Negative for abdominal pain, blood in stool, constipation and diarrhea.  Genitourinary: Negative for  dysuria, hematuria and urgency.  Musculoskeletal: Negative for myalgias.  Skin: Negative for rash.  Neurological: Positive for dizziness. Negative for weakness and light-headedness.     Physical Exam Updated Vital Signs BP (!) 113/56   Pulse 83   Temp 97.6 F (36.4 C) (Oral)   Resp 16   Ht 5\' 4"  (1.626 m)   Wt 88.9 kg   SpO2 94%   BMI 33.64 kg/m   Physical Exam  Constitutional: She is oriented to person, place, and time. She appears well-developed and well-nourished. No distress.  HENT:  Head: Normocephalic and atraumatic.  Nose: Nose normal.  Eyes: Pupils are equal, round, and reactive to light. Conjunctivae and EOM are normal. Right eye exhibits no discharge. Left eye exhibits no discharge. No scleral icterus.  Neck: Normal range of motion. Neck supple.  Cardiovascular: Normal rate, regular rhythm, normal heart sounds and intact distal pulses. Exam reveals no gallop and no friction rub.  No murmur  heard. Pulmonary/Chest: Effort normal and breath sounds normal. No respiratory distress.  Abdominal: Soft. Bowel sounds are normal. She exhibits no distension. There is no tenderness. There is no guarding.  Musculoskeletal: Normal range of motion. She exhibits no edema.  No lower extremity edema, erythema or calf tenderness bilaterally.  Neurological: She is alert and oriented to person, place, and time. No cranial nerve deficit or sensory deficit. She exhibits normal muscle tone. Coordination normal.  Pupils reactive. No facial asymmetry noted. Cranial nerves appear grossly intact. Sensation intact to light touch on face, BUE and BLE. Strength 5/5 in BUE and BLE.   Skin: Skin is warm and dry. No rash noted.  Psychiatric: She has a normal mood and affect.  Nursing note and vitals reviewed.    ED Treatments / Results  Labs (all labs ordered are listed, but only abnormal results are displayed) Labs Reviewed  COMPREHENSIVE METABOLIC PANEL - Abnormal; Notable for the following  components:      Result Value   BUN 29 (*)    Creatinine, Ser 1.86 (*)    Calcium 8.7 (*)    Albumin 3.4 (*)    GFR calc non Af Amer 24 (*)    GFR calc Af Amer 28 (*)    All other components within normal limits  BRAIN NATRIURETIC PEPTIDE - Abnormal; Notable for the following components:   B Natriuretic Peptide 337.1 (*)    All other components within normal limits  CBC WITH DIFFERENTIAL/PLATELET  I-STAT TROPONIN, ED    EKG EKG Interpretation  Date/Time:  Wednesday May 14 2018 12:24:13 EST Ventricular Rate:  77 PR Interval:    QRS Duration: 91 QT Interval:  403 QTC Calculation: 457 R Axis:   -22 Text Interpretation:  Sinus rhythm Prolonged PR interval Borderline left axis deviation Low voltage, extremity leads When comapred to prior, no significant changes seen.  No STEMI Confirmed by Theda Belfastegeler, Chris ([id number]) on 05/14/2018 1:09:43 PM   Radiology Dg Chest 2 View  Result Date: 05/14/2018 CLINICAL DATA:  Weakness EXAM: CHEST - 2 VIEW COMPARISON:  04/12/2018 FINDINGS: There is no focal parenchymal opacity. There is no pleural effusion or pneumothorax. The heart and mediastinal contours are unremarkable. The osseous structures are unremarkable. IMPRESSION: No active cardiopulmonary disease. Electronically Signed   By: Elige KoHetal  Patel   On: 05/14/2018 14:01   Ct Renal Stone Study  Result Date: 05/14/2018 CLINICAL DATA:  Constipation EXAM: CT ABDOMEN AND PELVIS WITHOUT CONTRAST TECHNIQUE: Multidetector CT imaging of the abdomen and pelvis was performed following the standard protocol without IV contrast. COMPARISON:  08/08/2011 FINDINGS: Lower chest: Coronary atherosclerotic calcification. Chronic scarring in the right lower lobe with volume loss. Chronic reticulation in the lower lobes without honeycombing. There is a 5 mm average diameter pulmonary nodule in the right lower lobe that is stable from 2012 and benign. Hepatobiliary: No focal liver abnormality.Cholecystectomy with normal  common bile duct diameter Pancreas: Generalized atrophy. Spleen: Unremarkable. Adrenals/Urinary Tract: Negative adrenals. No hydronephrosis or stone. Bilateral renal sinus cysts. Unremarkable bladder. Stomach/Bowel: No obstruction. No appendicitis. Left colonic diverticulosis. Calcification along the transverse mesocolon attributed to remote inflammation. Vascular/Lymphatic: No acute vascular abnormality. There is atherosclerotic calcification of the aorta. No mass or adenopathy. Reproductive:No pathologic findings. Other: No ascites or pneumoperitoneum. Fatty midline and right inguinal hernias Musculoskeletal: No acute abnormalities. Facet degeneration with L4-5 anterolisthesis. L2-3 advanced disc degeneration IMPRESSION: 1. No acute finding. No stool retention to correlate with history of constipation. 2. Chronic findings are described above. Electronically Signed  By: Marnee Spring M.D.   On: 05/14/2018 16:05    Procedures Procedures (including critical care time)  Medications Ordered in ED Medications  clopidogrel (PLAVIX) tablet 75 mg (has no administration in time range)  metoprolol succinate (TOPROL-XL) 24 hr tablet 25 mg (has no administration in time range)  sodium chloride 0.9 % bolus 500 mL (0 mLs Intravenous Stopped 05/14/18 1629)  acetaminophen (TYLENOL) tablet 650 mg (650 mg Oral Given 05/14/18 1413)  promethazine (PHENERGAN) injection 12.5 mg (12.5 mg Intravenous Given 05/14/18 1659)     Initial Impression / Assessment and Plan / ED Course  I have reviewed the triage vital signs and the nursing notes.  Pertinent labs & imaging results that were available during my care of the patient were reviewed by me and considered in my medical decision making (see chart for details).  Clinical Course as of May 15 1835  Wed May 14, 2018  1250  positive orthostatics w/ ems; 122/82 sitting, 98/58 standing   [HK]    Clinical Course User Index [HK] Dietrich Pates, PA-C    84 year old  female with past medical history of acute anterior wall MI diagnosed on 04/04/2018, CHF, currently anticoagulated on Plavix who presents to ED for 4-week history of nausea, several episodes of nonbloody, nonbilious emesis, generalized weakness and dizziness.  States that her dizziness got worse last night when she was laying down in her bed.  She did have some recent medication changes including decreasing her dose of amiodarone, switching from Entresto to losartan (which was done last night and she has yet to take a dose of losartan), switching from Plavix and Eliquis to only Plavix.  Patient and family are concerned because she was not on any daily medications prior to her MI 5 weeks ago.  They believe this is the cause of her symptoms.  Plan is to obtain lab work, EKG, chest x-ray.  Patient found to be orthostatic with EMS and given 500 cc bolus.  Last echo done last month shows EF of 30 to 35%. Will give gentle hydration and Tylenol for back pain.  2:07 PM Lab work significant for creatinine of 1.86 which is slightly elevated from reading last week which read 1.6, BUN of 29, GFR of 24.  BNP slightly elevated at 337.  CBC, troponin unremarkable.  Chest x-ray shows no acute findings.  EKG shows no significant change in EKG from prior tracings.  We will consult cardiology for any medication changes.  6:36 PM Patient and family hesitant on admission.  However, cardiologist came and spoke to patient regarding possible overnight observation for medication management and gentle hydration because of her AKI and poor EF.  Patient and family are agreeable to admission.  Will call hospitalist to admit.  Cardiologist recommends giving nightly dose of Plavix and metoprolol today.  Patient discussed with and seen by my attending, Dr. Rush Landmark.    Portions of this note were generated with Scientist, clinical (histocompatibility and immunogenetics). Dictation errors may occur despite best attempts at proofreading.  Final Clinical Impressions(s) /  ED Diagnoses   Final diagnoses:  Acute kidney injury [facility])    ED Discharge Orders    None       Dietrich Pates, PA-C 05/14/18 1916    Tegeler, Canary Brim, MD 05/15/18 [phone]

## 2018-05-14 NOTE — H&P (Signed)
History and Physical    [name] ZOX:[id number] DOB: 1934-02-07 DOA: 05/14/2018  PCP: Daylene Katayama, PA Patient coming from: Home  Chief Complaint: Nausea and vomiting  HPI: [name] is a 84 y.o. female with medical history significant of CKD 3, paroxysmal A. fib, CAD status post STEMI with PCI, chronic combined congestive heart failure, hyperlipidemia presenting to the hospital for evaluation of nausea and vomiting.  Patient states she has been having the symptoms for the past 3 weeks.  Denies having any abdominal pain, diarrhea, constipation, fevers, or chills.  Reports having a low appetite.  Denies having any chest pain or shortness of breath.  No other complaints.  ED Course: Orthostatics positive by EMS prior to arrival.  On arrival, afebrile, not tachycardic, not tachypneic, blood pressure 116/54, and satting well on room air. No leukocytosis.  Creatinine 1.8, recent baseline 1.1-1.6.  BNP 337; was 1045 a month ago.  I-STAT troponin negative and EKG not suggestive of ACS.  Chest x-ray showing no active cardiopulmonary disease.  CT renal stone study showing no acute finding.  Patient was seen by cardiology in the ED.  Review of Systems: As per HPI otherwise 10 point review of systems negative.    Past Medical History:  Diagnosis Date  . Acute systolic heart failure (HCC)   . Arthritis   . CHF (congestive heart failure) (HCC)   . Coronary artery disease   . Hyperlipidemia   . PAF (paroxysmal atrial fibrillation) (HCC)   . STEMI (ST elevation myocardial infarction) (HCC)    04/04/18 PCI/DES to the pLAD, 95% dRCA (possible staged intervention), 75% mLAD, EF 45%    Past Surgical History:  Procedure Laterality Date  . CHOLECYSTECTOMY    . CHOLECYSTECTOMY  08/09/2011   Procedure: LAPAROSCOPIC CHOLECYSTECTOMY;  Surgeon: Robyne Askew, MD;  Location: [facility];  Service: General;  Laterality: N/A;  . CORONARY/GRAFT ACUTE MI REVASCULARIZATION N/A 04/04/2018   Procedure:  Coronary/Graft Acute MI Revascularization;  Surgeon: Corky Crafts, MD;  Location: [facility] INVASIVE CV LAB;  Service: Cardiovascular;  Laterality: N/A;  . HERNIA REPAIR    . LEFT HEART CATH AND CORONARY ANGIOGRAPHY N/A 04/04/2018   Procedure: LEFT HEART CATH AND CORONARY ANGIOGRAPHY;  Surgeon: Corky Crafts, MD;  Location: [facility] INVASIVE CV LAB;  Service: Cardiovascular;  Laterality: N/A;  . TUBAL LIGATION       reports that she has never smoked. She has never used smokeless tobacco. She reports that she does not drink alcohol or use drugs.  Allergies  Allergen Reactions  . Cymbalta [Duloxetine Hcl] Other (See Comments)    "Made me feel crazy"    Family History  Problem Relation Age of Onset  . Hypertension Mother     Prior to Admission medications   Medication Sig Start Date End Date Taking? Authorizing Provider  acetaminophen (TYLENOL) 325 MG tablet Take 650 mg by mouth every 6 (six) hours as needed (for back pain).   Yes [provider]  amiodarone (PACERONE) 200 MG tablet Take 1 tablet (200 mg total) by mouth daily. 05/05/18  Yes Robbie Lis M, PA-C  atorvastatin (LIPITOR) 80 MG tablet Take 1 tablet (80 mg total) by mouth daily at 6 PM. 05/08/18  Yes Corky Crafts, MD  clopidogrel (PLAVIX) 75 MG tablet Take 1 tablet (75 mg total) by mouth daily. 05/08/18  Yes Corky Crafts, MD  furosemide (LASIX) 40 MG tablet Take 1 tablet (40 mg total) by mouth daily. 05/08/18  Yes  Corky CraftsVaranasi, Jayadeep S, MD  isosorbide mononitrate (IMDUR) 30 MG 24 hr tablet Take 1 tablet (30 mg total) by mouth daily. 05/08/18  Yes Corky CraftsVaranasi, Jayadeep S, MD  metoprolol succinate (TOPROL XL) 25 MG 24 hr tablet Take 1 tablet (25 mg total) by mouth daily. 05/08/18  Yes Corky CraftsVaranasi, Jayadeep S, MD  nitroGLYCERIN (NITROSTAT) 0.4 MG SL tablet Place 1 tablet (0.4 mg total) under the tongue every 5 (five) minutes as needed for chest pain. 05/08/18  Yes Corky CraftsVaranasi, Jayadeep S, MD  ondansetron  (ZOFRAN-ODT) 4 MG disintegrating tablet Take 4 mg by mouth as needed for nausea/vomiting. 04/30/18  Yes [provider]  spironolactone (ALDACTONE) 25 MG tablet Take 0.5 tablets (12.5 mg total) by mouth daily. 05/08/18  Yes Corky CraftsVaranasi, Jayadeep S, MD  traMADol (ULTRAM) 50 MG tablet Take 50 mg by mouth as needed (for pain).  04/30/18  Yes [provider]  losartan (COZAAR) 25 MG tablet Take 1 tablet (25 mg total) by mouth daily. 05/13/18   Corky CraftsVaranasi, Jayadeep S, MD    Physical Exam: Vitals:   05/14/18 1615 05/14/18 1645 05/14/18 1830 05/14/18 2050  BP: 115/64 112/65 (!) 113/56 (!) 121/57  Pulse: 82 80 83 83  Resp: (!) 22 12 16 16   Temp:    98.2 F (36.8 C)  TempSrc:    Oral  SpO2: 95% 95% 94% 93%  Weight:      Height:        Physical Exam  Constitutional: She is oriented to person, place, and time. She appears well-developed and well-nourished. No distress.  HENT:  Head: Normocephalic.  Mouth/Throat: Oropharynx is clear and moist.  Eyes: Right eye exhibits no discharge. Left eye exhibits no discharge.  Neck: Neck supple. No tracheal deviation present.  Cardiovascular: Normal rate, regular rhythm and intact distal pulses.  Pulmonary/Chest: Effort normal and breath sounds normal. No respiratory distress. She has no wheezes. She has no rales.  Abdominal: Soft. Bowel sounds are normal. She exhibits no distension. There is no tenderness. There is no guarding.  Musculoskeletal: She exhibits no edema.  Neurological: She is alert and oriented to person, place, and time.  Skin: Skin is warm and dry. She is not diaphoretic.  Psychiatric: She has a normal mood and affect. Her behavior is normal.     Labs on Admission: I have personally reviewed following labs and imaging studies  CBC: Recent Labs  Lab 05/14/18 1226  WBC 9.4  NEUTROABS 6.3  HGB 12.6  HCT 40.7  MCV 97.8  PLT 178   Basic Metabolic Panel: Recent Labs  Lab 05/08/18 1223 05/14/18 1226  NA 143 139  K  4.2 3.5  CL 99 103  CO2 22 24  GLUCOSE 149* 96  BUN 34* 29*  CREATININE 1.61* 1.86*  CALCIUM 9.5 8.7*   GFR: Estimated Creatinine Clearance: 25.2 mL/min (A) (by C-G formula based on SCr of 1.86 mg/dL (H)). Liver Function Tests: Recent Labs  Lab 05/14/18 1226  AST 21  ALT 23  ALKPHOS 52  BILITOT 0.8  PROT 6.6  ALBUMIN 3.4*   No results for input(s): LIPASE, AMYLASE in the last 168 hours. No results for input(s): AMMONIA in the last 168 hours. Coagulation Profile: No results for input(s): INR, PROTIME in the last 168 hours. Cardiac Enzymes: No results for input(s): CKTOTAL, CKMB, CKMBINDEX, TROPONINI in the last 168 hours. BNP (last 3 results) No results for input(s): PROBNP in the last 8760 hours. HbA1C: No results for input(s): HGBA1C in the last 72 hours.  CBG: No results for input(s): GLUCAP in the last 168 hours. Lipid Profile: No results for input(s): CHOL, HDL, LDLCALC, TRIG, CHOLHDL, LDLDIRECT in the last 72 hours. Thyroid Function Tests: No results for input(s): TSH, T4TOTAL, FREET4, T3FREE, THYROIDAB in the last 72 hours. Anemia Panel: No results for input(s): VITAMINB12, FOLATE, FERRITIN, TIBC, IRON, RETICCTPCT in the last 72 hours. Urine analysis:    Component Value Date/Time   COLORURINE YELLOW 08/08/2011 1800   APPEARANCEUR CLEAR 08/08/2011 1800   LABSPEC 1.029 08/08/2011 1800   PHURINE 5.5 08/08/2011 1800   GLUCOSEU NEGATIVE 08/08/2011 1800   HGBUR NEGATIVE 08/08/2011 1800   BILIRUBINUR NEGATIVE 08/08/2011 1800   KETONESUR 15 (A) 08/08/2011 1800   PROTEINUR NEGATIVE 08/08/2011 1800   UROBILINOGEN 0.2 08/08/2011 1800   NITRITE NEGATIVE 08/08/2011 1800   LEUKOCYTESUR NEGATIVE 08/08/2011 1800    Radiological Exams on Admission: Dg Chest 2 View  Result Date: 05/14/2018 CLINICAL DATA:  Weakness EXAM: CHEST - 2 VIEW COMPARISON:  04/12/2018 FINDINGS: There is no focal parenchymal opacity. There is no pleural effusion or pneumothorax. The heart and  mediastinal contours are unremarkable. The osseous structures are unremarkable. IMPRESSION: No active cardiopulmonary disease. Electronically Signed   By: Elige Ko   On: 05/14/2018 14:01   Ct Renal Stone Study  Result Date: 05/14/2018 CLINICAL DATA:  Constipation EXAM: CT ABDOMEN AND PELVIS WITHOUT CONTRAST TECHNIQUE: Multidetector CT imaging of the abdomen and pelvis was performed following the standard protocol without IV contrast. COMPARISON:  08/08/2011 FINDINGS: Lower chest: Coronary atherosclerotic calcification. Chronic scarring in the right lower lobe with volume loss. Chronic reticulation in the lower lobes without honeycombing. There is a 5 mm average diameter pulmonary nodule in the right lower lobe that is stable from 2012 and benign. Hepatobiliary: No focal liver abnormality.Cholecystectomy with normal common bile duct diameter Pancreas: Generalized atrophy. Spleen: Unremarkable. Adrenals/Urinary Tract: Negative adrenals. No hydronephrosis or stone. Bilateral renal sinus cysts. Unremarkable bladder. Stomach/Bowel: No obstruction. No appendicitis. Left colonic diverticulosis. Calcification along the transverse mesocolon attributed to remote inflammation. Vascular/Lymphatic: No acute vascular abnormality. There is atherosclerotic calcification of the aorta. No mass or adenopathy. Reproductive:No pathologic findings. Other: No ascites or pneumoperitoneum. Fatty midline and right inguinal hernias Musculoskeletal: No acute abnormalities. Facet degeneration with L4-5 anterolisthesis. L2-3 advanced disc degeneration IMPRESSION: 1. No acute finding. No stool retention to correlate with history of constipation. 2. Chronic findings are described above. Electronically Signed   By: Marnee Spring M.D.   On: 05/14/2018 16:05    EKG: Independently reviewed.  Sinus rhythm, prolonged PR interval.  Assessment/Plan Principal Problem:   AKI (acute kidney injury) (HCC) Active Problems:   PAF (paroxysmal  atrial fibrillation) (HCC)   HLD (hyperlipidemia)   Acute on chronic combined systolic and diastolic congestive heart failure (HCC)   Nausea & vomiting   CKD (chronic kidney disease) stage 3, GFR 30-59 ml/min (HCC)   Orthostatic hypotension   CAD (coronary artery disease)  AKI on CKD III Likely prerenal due to dehydration from vomiting and home diuretic use. Creatinine 1.8, recent baseline 1.1-1.6.  -Gentle IV fluid hydration -Avoid nephrotoxic agents/contrast -Hold home Lasix, losartan, spironolactone -Repeat BMP in a.m.  Orthostatic hypotension Likely secondary to dehydration from vomiting. Orthostatics positive by EMS prior to arrival. -Gentle IV fluid hydration -Hold home Lasix, losartan, spironolactone -Repeat orthostatics in the morning  Nausea and vomiting LFTs normal.  No leukocytosis.  CT renal stone study showing evidence of prior cholecystectomy and no acute intra-abdominal pathology.  No hydronephrosis or renal  stone.  Cardiology believes amiodarone could be contributing to her nausea and vomiting.  Recommending holding most of her home cardiac medications except metoprolol, Plavix, and statin. -IV Phenergan PRN -Resume Plavix -Daughter does not want patient to receive statin at this time -Continue metoprolol with hold parameters (hold if SBP less than 100)  CAD status post recent STEMI with PCI Patient had a STEMI in October 2019 and underwent left heart cath with PCI/DES to LCx.  No anginal symptoms at present. I-STAT troponin negative and EKG not suggestive of ACS. -Resume home Plavix, metoprolol with hold parameters -Daughter does not want patient to receive statin at this time  Chronic combined congestive heart failure EF 30 to 35% on last echo done in October 2019.  Appears euvolemic/dry on exam.  Creatinine elevated suggesting dehydration. BNP 337; was 1045 a month ago.    Chest x-ray without evidence of pulmonary edema.  -Gentle IV fluid hydration in the setting  of AKI -Hold home Lasix, losartan, spironolactone -Continue home metoprolol with hold parameters -Monitor volume status closely with IV fluid resuscitation  Paroxysmal A. Fib [id number] 5. Patient had a brief episode during admission for STEMI.  She has been on amiodarone for rhythm control however cardiology suspects it could be contributing to her nausea and vomiting.  She is not on anticoagulation given brief episode and no recurrence. -Cardiology recommending stopping amiodarone -Monitor on telemetry for recurrent arrhythmia -Hold off anticoagulation unless clear evidence of recurrence  Hyperlipidemia -Daughter does not want patient to receive statin at this time  DVT prophylaxis: Lovenox Code Status: Patient wishes to be DNR. Family Communication: Daughter at bedside. Disposition Plan: Anticipate discharge to home in 1 to 2 days. Consults called: Cardiology (Dr. Herbie Baltimore) Admission status: Observation  John Giovanni MD [facility] Pager [phone]  If 7PM-7AM, please contact night-coverage [email or web address] Password [facility]  05/15/2018, 12:54 AM

## 2018-05-14 NOTE — Consult Note (Addendum)
Cardiology Consult:   Patient ID: KHARLIE BRING MRN: [id number]; DOB: Sep 09, 1933   Admission date: 05/14/2018  Primary Care Provider: Daylene Katayama, PA Primary Cardiologist: Lance Muss, MD  Primary Electrophysiologist:  None   Chief Complaint:  dizziness  Patient Profile:   [name] is a 84 y.o. female with a PMH of CAD s/p recent STEMI 03/2018 with PCI/DES to pLCx with plans for staged RCA intervention, ischemic cardiomyopathy with EF 40%, brief atrial fibrillation not on AC, and HLD, who presents with dizziness, nausea, and vomiting.  She is being seen today at the request of Tegeler, Canary Brim, MD and  Dietrich Pates, PA-C.  History of Present Illness:   [name] has been dealing with significant nausea, vomiting, and poor po intake since 04/24/18. She had some constipation recently which was relieved with an aggressive bowel regimen and an enema on 05/12/18. On the evening of 05/13/18 she had a severe episode of dizziness. She has felt quite weak and lightheaded with position changes. She has not taken any of her medications since the morning of 05/12/18, and has not had Plavix since 05/11/18. She reports urinating normally and has not appreciated any dysuria or foul smelling urine. She denies fevers, URI symptoms, abdominal pain, chest pain, SOB, orthopnea, PND, LE edema, or syncope. She was seen an an urgent care facility today for the same complaints and was sent via EMS to [facility] ED for further evaluation given concern for AKI, electrolyte imbalance, medication side effects, and possible bowel obstruction. Orthostatics reportedly positive by EMS with BP 122/82 sitting and 98/58 standing.   She was admitted to the hospital 04/04/18 for anterior wall STEMI and found to have severe two-vessel disease and underwent PCI/DES to pLCx with plans for staged intervention to RCA (95% stenosis). Echo at that time revealed EF 40%. She was also noted to have a brief episodes of atrial  fibrillation managed with amiodarone but anticoagulation was not initiated given quick transition to sinus rhythm. She was discharged home on 04/08/18, however represented 04/12/18 with complaints of SOB and orthopnea felt to be 2/2 acute systolic CHF. She was diuresed with IV lasix, however repeat echo showed further drop in EF to 30-35% as well as early evidence of thrombus. She was then transitioned from brilinta to plavix and eliquis was started for LV thrombus. She was seen outpatient by Dr. Eldridge Dace 05/08/18 and primary complaints included nausea, decreased po intake, and back pain. She was without anginal complaints and given lack of cardiac symptoms, was not interested in revascularization at that time. Eliquis was discontinued at that time given nausea. She was recommended to continue low dose amiodarone in an effort to maintain NSR. Family expressed concerns about taking so many prescription medications at that time. Lab work at that visit revealed Cr 1.61 (baseline ~1.2) and she was recommended to stop entresto and start losartan 25mg  daily.   ED course: hypotensive, O2 sat in the 80s on RA - improved to 90s on 2L O2 via [place]; otherwise VSS. Labs notable for K 3.5, Cr 1.86 (baseline 1.2), albumin 3.4, LFTs wnl, CBC wnl, BNP 337 (elevated to 1045 03/2018), Trop 0.00. EKG with sinus rhythm with 1st degree AV block, no STE/D, no TWI, no significant change from previous. CXR without acute findings. Cardiology asked to evaluate for dizziness.   Past Medical History:  Diagnosis Date  . Acute systolic heart failure (HCC)   . Hyperlipidemia   . PAF (paroxysmal atrial fibrillation) (HCC)   . STEMI (  ST elevation myocardial infarction) (HCC)    04/04/18 PCI/DES to the pLAD, 95% dRCA (possible staged intervention), 75% mLAD, EF 45%    Past Surgical History:  Procedure Laterality Date  . CHOLECYSTECTOMY    . CHOLECYSTECTOMY  08/09/2011   Procedure: LAPAROSCOPIC CHOLECYSTECTOMY;  Surgeon: Robyne Askew,  MD;  Location: [facility] ORS;  Service: General;  Laterality: N/A;  . CORONARY/GRAFT ACUTE MI REVASCULARIZATION N/A 04/04/2018   Procedure: Coronary/Graft Acute MI Revascularization;  Surgeon: Corky Crafts, MD;  Location: [facility] INVASIVE CV LAB;  Service: Cardiovascular;  Laterality: N/A;  . HERNIA REPAIR    . LEFT HEART CATH AND CORONARY ANGIOGRAPHY N/A 04/04/2018   Procedure: LEFT HEART CATH AND CORONARY ANGIOGRAPHY;  Surgeon: Corky Crafts, MD;  Location: [facility] INVASIVE CV LAB;  Service: Cardiovascular;  Laterality: N/A;  . TUBAL LIGATION       Medications Prior to Admission: Prior to Admission medications   Medication Sig Start Date End Date Taking? Authorizing Provider  amiodarone (PACERONE) 200 MG tablet Take 1 tablet (200 mg total) by mouth daily. 05/05/18   Robbie Lis M, PA-C  atorvastatin (LIPITOR) 80 MG tablet Take 1 tablet (80 mg total) by mouth daily at 6 PM. 05/08/18   Corky Crafts, MD  clopidogrel (PLAVIX) 75 MG tablet Take 1 tablet (75 mg total) by mouth daily. 05/08/18   Corky Crafts, MD  DULoxetine (CYMBALTA) 20 MG capsule Take 20 mg by mouth as needed for pain. 04/30/18   [provider]  furosemide (LASIX) 40 MG tablet Take 1 tablet (40 mg total) by mouth daily. 05/08/18   Corky Crafts, MD  isosorbide mononitrate (IMDUR) 30 MG 24 hr tablet Take 1 tablet (30 mg total) by mouth daily. 05/08/18   Corky Crafts, MD  losartan (COZAAR) 25 MG tablet Take 1 tablet (25 mg total) by mouth daily. 05/13/18   Corky Crafts, MD  metoprolol succinate (TOPROL XL) 25 MG 24 hr tablet Take 1 tablet (25 mg total) by mouth daily. 05/08/18   Corky Crafts, MD  nitroGLYCERIN (NITROSTAT) 0.4 MG SL tablet Place 1 tablet (0.4 mg total) under the tongue every 5 (five) minutes as needed for chest pain. 05/08/18   Corky Crafts, MD  ondansetron (ZOFRAN-ODT) 4 MG disintegrating tablet Take 4 mg by mouth as needed for nausea/vomiting.  04/30/18   [provider]  spironolactone (ALDACTONE) 25 MG tablet Take 0.5 tablets (12.5 mg total) by mouth daily. 05/08/18   Corky Crafts, MD  traMADol (ULTRAM) 50 MG tablet Take 50 mg by mouth as needed for pain. 04/30/18   [provider]     Allergies:   No Known Allergies  Social History:   Social History   Socioeconomic History  . Marital status: Married    Spouse name: Not on file  . Number of children: Not on file  . Years of education: Not on file  . Highest education level: Not on file  Occupational History  . Not on file  Social Needs  . Financial resource strain: Not on file  . Food insecurity:    Worry: Not on file    Inability: Not on file  . Transportation needs:    Medical: Not on file    Non-medical: Not on file  Tobacco Use  . Smoking status: Never Smoker  . Smokeless tobacco: Never Used  Substance and Sexual Activity  . Alcohol use: No  . Drug use: No  . Sexual activity:  Not on file  Lifestyle  . Physical activity:    Days per week: Not on file    Minutes per session: Not on file  . Stress: Not on file  Relationships  . Social connections:    Talks on phone: Not on file    Gets together: Not on file    Attends religious service: Not on file    Active member of club or organization: Not on file    Attends meetings of clubs or organizations: Not on file    Relationship status: Not on file  . Intimate partner violence:    Fear of current or ex partner: Not on file    Emotionally abused: Not on file    Physically abused: Not on file    Forced sexual activity: Not on file  Other Topics Concern  . Not on file  Social History Narrative  . Not on file    Family History:   The patient's family history includes Hypertension in her mother.    ROS:  Please see the history of present illness.  All other ROS reviewed and negative.     Physical Exam/Data:   Vitals:   05/14/18 1151 05/14/18 1201 05/14/18 1230 05/14/18 1330   BP: (!) 116/54  103/62 (!) 99/57  Pulse: 76  78 77  Resp: 18  15 14   Temp: 97.6 F (36.4 C)     TempSrc: Oral     SpO2: 96%  96% 98%  Weight: 88.9 kg 88.9 kg    Height: 5\' 4"  (1.626 m) 5\' 4"  (1.626 m)     No intake or output data in the 24 hours ending 05/14/18 1430 Filed Weights   05/14/18 1151 05/14/18 1201  Weight: 88.9 kg 88.9 kg   Body mass index is 33.64 kg/m.  General:  Well nourished, well developed, laying in bed in no acute distress HEENT: sclera anicteric, dry MM Neck: no JVD Endocrine:  No thryomegaly Vascular: No carotid bruits; distal pulses 2+ bilaterally  Cardiac:  normal S1, S2; RRR; no murmurs, rubs, or gallops Lungs:  clear to auscultation bilaterally, no wheezing, rhonchi or rales  Abd: soft, obese, nontender, no hepatomegaly  Ext: no edema Musculoskeletal:  No deformities, BUE and BLE strength normal and equal Skin: warm and dry  Neuro:  CNs 2-12 intact, no focal abnormalities noted Psych:  Normal affect    EKG:  sinus rhythm with 1st degree AV block, no STE/D, no TWI, no significant change from previous.  Relevant CV Studies: Left heart catheterization 04/04/18:  Dist RCA lesion is 95% stenosed. This occurs just before a high bifurcation of the large posterior lateral artery and posterior descending artery.  LV end diastolic pressure is severely elevated. LVEDP 30 mm Hg.  There is no aortic valve stenosis.  Ost LAD to Prox LAD lesion is 100% stenosed.  A drug-eluting stent was successfully placed using a STENT SYNERGY DES 3X38.  Post intervention, there is a 0% residual stenosis.  Mid LAD lesion is 75% stenosed, distal to the stented area.  Mid LM to Dist LM lesion is 30% stenosed.   Complex multivessel disease.  Given her age and elevated LVEDP, we did not think she would be a good candidate for emergency surgery.  Therefore, we stented the LAD to restore TIMI-3 flow.  There is still residual distal left main disease and mid LAD disease  which was not treated today.  There is complex bifurcation disease on the right.  The plan will be  to let her recover over the next 4 weeks, on dual antiplatelet therapy.  Then we can consider whether she would be a candidate for bypass surgery after that.  Recommend uninterrupted dual antiplatelet therapy with Aspirin 81mg  daily and Ticagrelor 90mg  twice daily for a minimum of 12 months (ACS - Class I recommendation).  DAPT may need to be temporarily held for CABG after 30 days.   High dose statin, beta blocker and DAPT orally.  For now, will give IV Lasix and IV NTG to help with volume overload.  Echocardiogram 04/05/18: Study Conclusions  - Left ventricle: The cavity size was normal. Wall thickness was   increased in a pattern of mild LVH. Systolic function was mildly   to moderately reduced. The estimated ejection fraction was in the   range of 40% to 45%. Anterior, anteroseptal, apical and   inferoapical akinesis suggestive of LAD territory infarct. No   apical thrombus by Definity contrast. The study is not   technically sufficient to allow evaluation of LV diastolic   function. - Aortic valve: Sclerosis without stenosis. There was mild   regurgitation. - Mitral valve: Mildly thickened leaflets . There was trivial   regurgitation. - Left atrium: The atrium was normal in size. - Inferior vena cava: The vessel was dilated. The respirophasic   diameter changes were blunted (< 50%), consistent with elevated   central venous pressure.  Impressions:  - LVEF 40-45%, mild LVH, LAD territory infarct, no apical thrombus,   mild AI, trivial MR, normal LA size, dilated IVC.  Echocardiogram 04/13/18: Study Conclusions  - Left ventricle: The cavity size was normal. Systolic function was   moderately to severely reduced. The estimated ejection fraction   was in the range of 30% to 35%. Wall motion was normal; there   were no regional wall motion abnormalities. - Aortic valve:  There was mild regurgitation. - Mitral valve: There was mild regurgitation. - Right atrium: The atrium was normal in size. - Pulmonary arteries: Systolic pressure was within the normal   range.  Impressions:  - There is akinesis of the basal and mid inferoseptal,   anteroseptal, anterior and apical septal and anterior walls.   There is no definitive thrombus but heavy smoke consistent with   pre-thrombotic state. Anticoagulation is recommended.  Laboratory Data:  Chemistry Recent Labs  Lab 05/08/18 1223 05/14/18 1226  NA 143 139  K 4.2 3.5  CL 99 103  CO2 22 24  GLUCOSE 149* 96  BUN 34* 29*  CREATININE 1.61* 1.86*  CALCIUM 9.5 8.7*  GFRNONAA 30* 24*  GFRAA 34* 28*  ANIONGAP  --  12    Recent Labs  Lab 05/14/18 1226  PROT 6.6  ALBUMIN 3.4*  AST 21  ALT 23  ALKPHOS 52  BILITOT 0.8   Hematology Recent Labs  Lab 05/14/18 1226  WBC 9.4  RBC 4.16  HGB 12.6  HCT 40.7  MCV 97.8  MCH 30.3  MCHC 31.0  RDW 14.7  PLT 178   Cardiac EnzymesNo results for input(s): TROPONINI in the last 168 hours.  Recent Labs  Lab 05/14/18 1234  TROPIPOC 0.00    BNP Recent Labs  Lab 05/14/18 1226  BNP 337.1*    DDimer No results for input(s): DDIMER in the last 168 hours.  Radiology/Studies:  Dg Chest 2 View  Result Date: 05/14/2018 CLINICAL DATA:  Weakness EXAM: CHEST - 2 VIEW COMPARISON:  04/12/2018 FINDINGS: There is no focal parenchymal opacity. There is no pleural effusion  or pneumothorax. The heart and mediastinal contours are unremarkable. The osseous structures are unremarkable. IMPRESSION: No active cardiopulmonary disease. Electronically Signed   By: Elige Ko   On: 05/14/2018 14:01    Assessment and Plan:   1. Dizziness: likely 2/2 dehydration in the setting of nausea and vomiting. No anginal or volume overload symptoms. Orthostatics positive by EMS prior to arrival.  - Hold home lasix, losartan, and spironolactone  2. Nausea and vomiting: has been  persistent for the past 3 weeks despite multiple medication adjustments. She has not had any medications in the past 36 hours and nausea/vomiting has persisted. Still possible amiodarone is contributing but remainder of other medications should be washed out of her system at this point. She is undergoing a CT A/P to further evaluation. She reports minimal/no relief with zofran.  - Continue supportive care per primary team.  - Will stop amiodarone at this time  3. CAD s/p recent STEMI with PCI to LCx: patient had a STEMI 04/04/18 and underwent LHC with PCI/DES to LCx and plans for possible staged intervention to occluded RCA which has not occurred. She has missed several days of plavix due to nausea/vomiting. No anginal complaints.  - Will resume plavix and statin - Will continue metoprolol with hold parameters (hold for SBP <100)  4. Chronic combined CHF: EF 30-35% on last echo 03/2018. Has been off of her medications for the past 36 hours. She appears euvolemic/dry on exam. Cr up suggesting dehydration.  - Will hold home lasix, losartan, and spironolactone at this time.  - Continue metoprolol with hold parameters - Monitor volume status closely with IVF resuscitation   5. Paroxysmal atrial fibrillation: brief episode during admission for STEMI. Has been on amiodarone for rhythm control, however suspicions high for amiodarone contributing to nausea/vomiting. Not on anticoagulation given brief episode and no recurrence.  - Will stop amiodarone at this point - Monitor on telemetry for recurrent arrhythmia  - Will hold off on anticoagulation unless clear evidence of recurrence   6. HLD:  - Continue statin  7. Acute on chronic renal insufficiency: Cr 1.86 today; baseline 1.2. Likely 2/2 dehydration and medications. Receiving IVFs  - Continue to monitor closely. - Will hold home lasix, losartan (was not actually taking), and spironolactone    Signed, Beatriz Stallion, PA-C  05/14/2018 2:30  PM   I have seen, examined and evaluated the patient this pm along with Beatriz Stallion, PA-C .  After reviewing all the available data and chart, we discussed the patients laboratory, study & physical findings as well as symptoms in detail. I agree with her findings, examination as well as impression recommendations as per our discussion.    Difficult situation.  I think that the best course of action is to monitor her at least overnight if not for a second night to make sure that she is actually able to tolerate p.o. both food and liquids without throwing it up.  I am concerned that she is been off a lot of medicines.  We may take advantage of this however with holding the ARB (which she never started), stopping amiodarone and holding both diuretics.  Need to monitor on telemetry to ensure that there is any recurrence of A. fib while off of amiodarone.  My suspicion is that her nausea if it is from the medicine is probably amiodarone.  Other concern however would be if this is related to low output failure.  She does not seem to be volume overloaded  and I would probably avoid diuretic at this point since he is gently hydrated her.  She probably needs a little more hydration although she does have some JVD on exam.  It took quite a bit of a conversation and discussion with the patient and her family about actually watching her overnight.  They want to go home unless there was an active plan for what to do.  I explained that we want to closely monitor renal function, blood pressure and heart rate/rhythm as well as her symptoms and that is easier to do while the patient is in the hospital.  If everything looks good tomorrow, we could consider discharge however I think we need to be assessed.  At a minimum she should be on Plavix and beta-blocker initially admission and then we can reevaluate going forward.     Bryan Lemmaavid , M.D., M.S. Interventional Cardiologist   Pager # [phone] Phone #  [phone] [address] [place], [place] [address]       For questions or updates, please contact [facility] Please consult [email or web address] for contact info under

## 2018-05-14 NOTE — Telephone Encounter (Signed)
Called patient's daughter in response to MyChart message. Last night pt felt like whole bed was flipping over and would make her nauseated.  This feeling happened 3 times.  She vomited each time. Everyday for a month she has had nausea. Has vomiting at least daily. Dizziness is new. Started losartan tues evening after stopping Entresto. No BP readings or HR.   Pt is currently in urgent care for these symptoms.  Advised without knowing BP / HR it sounds like her symptoms are unrelated to her heart.

## 2018-05-14 NOTE — ED Notes (Signed)
Patient transported to X-ray 

## 2018-05-14 NOTE — ED Notes (Signed)
Cards at bedside

## 2018-05-14 NOTE — ED Notes (Signed)
Pt. returned from XR. 

## 2018-05-15 DIAGNOSIS — I5043 Acute on chronic combined systolic (congestive) and diastolic (congestive) heart failure: Secondary | ICD-10-CM | POA: Diagnosis present

## 2018-05-15 DIAGNOSIS — E669 Obesity, unspecified: Secondary | ICD-10-CM | POA: Diagnosis present

## 2018-05-15 DIAGNOSIS — N183 Chronic kidney disease, stage 3 unspecified: Secondary | ICD-10-CM

## 2018-05-15 DIAGNOSIS — I25118 Atherosclerotic heart disease of native coronary artery with other forms of angina pectoris: Secondary | ICD-10-CM | POA: Diagnosis not present

## 2018-05-15 DIAGNOSIS — I951 Orthostatic hypotension: Secondary | ICD-10-CM

## 2018-05-15 DIAGNOSIS — Z955 Presence of coronary angioplasty implant and graft: Secondary | ICD-10-CM | POA: Diagnosis not present

## 2018-05-15 DIAGNOSIS — E860 Dehydration: Secondary | ICD-10-CM | POA: Diagnosis present

## 2018-05-15 DIAGNOSIS — Z7902 Long term (current) use of antithrombotics/antiplatelets: Secondary | ICD-10-CM | POA: Diagnosis not present

## 2018-05-15 DIAGNOSIS — N179 Acute kidney failure, unspecified: Secondary | ICD-10-CM | POA: Diagnosis present

## 2018-05-15 DIAGNOSIS — Z66 Do not resuscitate: Secondary | ICD-10-CM | POA: Diagnosis present

## 2018-05-15 DIAGNOSIS — I2510 Atherosclerotic heart disease of native coronary artery without angina pectoris: Secondary | ICD-10-CM

## 2018-05-15 DIAGNOSIS — R112 Nausea with vomiting, unspecified: Secondary | ICD-10-CM | POA: Diagnosis not present

## 2018-05-15 DIAGNOSIS — E785 Hyperlipidemia, unspecified: Secondary | ICD-10-CM | POA: Diagnosis present

## 2018-05-15 DIAGNOSIS — Z888 Allergy status to other drugs, medicaments and biological substances status: Secondary | ICD-10-CM | POA: Diagnosis not present

## 2018-05-15 DIAGNOSIS — I252 Old myocardial infarction: Secondary | ICD-10-CM | POA: Diagnosis not present

## 2018-05-15 DIAGNOSIS — I480 Paroxysmal atrial fibrillation: Secondary | ICD-10-CM | POA: Diagnosis present

## 2018-05-15 DIAGNOSIS — Z6834 Body mass index (BMI) 34.0-34.9, adult: Secondary | ICD-10-CM | POA: Diagnosis not present

## 2018-05-15 DIAGNOSIS — Z79899 Other long term (current) drug therapy: Secondary | ICD-10-CM | POA: Diagnosis not present

## 2018-05-15 DIAGNOSIS — I255 Ischemic cardiomyopathy: Secondary | ICD-10-CM | POA: Diagnosis present

## 2018-05-15 LAB — BASIC METABOLIC PANEL
Anion gap: 7 (ref 5–15)
BUN: 24 mg/dL — AB (ref 8–23)
CALCIUM: 8.5 mg/dL — AB (ref 8.9–10.3)
CO2: 27 mmol/L (ref 22–32)
CREATININE: 1.68 mg/dL — AB (ref 0.44–1.00)
Chloride: 106 mmol/L (ref 98–111)
GFR calc Af Amer: 32 mL/min — ABNORMAL LOW (ref >60)
GFR calc non Af Amer: 27 mL/min — ABNORMAL LOW (ref >60)
Glucose, Bld: 91 mg/dL (ref 70–99)
Potassium: 3.5 mmol/L (ref 3.5–5.1)
Sodium: 140 mmol/L (ref 135–145)

## 2018-05-15 LAB — GLUCOSE, CAPILLARY: Glucose-Capillary: 107 mg/dL — ABNORMAL HIGH (ref 70–99)

## 2018-05-15 MED ORDER — SODIUM CHLORIDE 0.9 % IV BOLUS
500.0000 mL | Freq: Once | INTRAVENOUS | Status: AC
  Administered 2018-05-15: 500 mL via INTRAVENOUS

## 2018-05-15 MED ORDER — PROMETHAZINE HCL 25 MG PO TABS: 25.0000 mg | ORAL_TABLET | Freq: Four times a day (QID) | ORAL | Status: DC | PRN

## 2018-05-15 MED ORDER — SODIUM CHLORIDE 0.9 % IV BOLUS: 500.0000 mL | Freq: Once | INTRAVENOUS | Status: DC

## 2018-05-15 MED ORDER — PROMETHAZINE HCL 25 MG PO TABS
12.5000 mg | ORAL_TABLET | Freq: Four times a day (QID) | ORAL | Status: DC | PRN
  Administered 2018-05-15 – 2018-05-17 (×4): 12.5 mg via ORAL
  Filled 2018-05-15 (×4): qty 1

## 2018-05-15 MED ORDER — SODIUM CHLORIDE 0.9 % IV SOLN
INTRAVENOUS | Status: DC
  Administered 2018-05-15: 12:00:00 via INTRAVENOUS

## 2018-05-15 NOTE — Progress Notes (Signed)
Picked up patient from [facility] and received report from RN.  Patient is alert and alerted to time place and person.  Patient used the commode upon coming to room.    05/15/18 1831  Vitals  Temp 98.1 F (36.7 C)  Temp Source Oral  BP (!) 116/53  BP Location Right Arm  BP Method Automatic  Patient Position (if appropriate) Sitting  Pulse Rate 77  Pulse Rate Source Dinamap  Oxygen Therapy  SpO2 92 %  O2 Device Room Air  Pain Assessment  Pain Scale 0-10  Pain Score 0  Height and Weight  Height 5\' 4"  (1.626 m)  Weight 91.4 kg  Type of Scale Used Standing  Type of Weight Actual  BSA (Calculated - sq m) 2.03 sq meters  BMI (Calculated) 34.59  Weight in (lb) to have BMI = 25 145.3

## 2018-05-15 NOTE — Progress Notes (Addendum)
Progress Note  Patient Name: [name] Date of Encounter: 05/15/2018  Primary Cardiologist: Larae Grooms, MD   Subjective   Feels much better this morning.  Smiling.  Able to keep food and drink down. Did have some nausea earlier this morning with standing, but finally felt better after IV Phenergan given. Still had a little orthostatic symptoms as well this morning.  Inpatient Medications    Scheduled Meds: . clopidogrel  75 mg Oral QHS  . enoxaparin (LOVENOX) injection  30 mg Subcutaneous Q24H  . metoprolol succinate  25 mg Oral Daily   Continuous Infusions: . sodium chloride 75 mL/hr at 05/15/18 1151   PRN Meds: acetaminophen, promethazine   Vital Signs    Vitals:   05/14/18 1645 05/14/18 1830 05/14/18 2050 05/15/18 0700  BP: 112/65 (!) 113/56 (!) 121/57 (!) 103/52  Pulse: 80 83 83 61  Resp: _0 Temp:   98.2 F (36.8 C) 98.5 F (36.9 C)  TempSrc:   Oral Oral  SpO2: 95% 94% 93% 96%  Weight:      Height:        Intake/Output Summary (Last 24 hours) at 05/15/2018 1215 Last data filed at 05/15/2018 0835 Gross per 24 hour  Intake 830.78 ml  Output -  Net 830.78 ml   Filed Weights   05/14/18 1151 05/14/18 1201  Weight: 88.9 kg 88.9 kg    Telemetry    Sinus rhythm, rate in the 60s and 70s.- Personally Reviewed  ECG    No new study- Personally Reviewed  Physical Exam   GEN: No acute distress.  Looks less uncomfortable today than yesterday.  Morbid obese. Neck: No JVD Cardiac: RRR, no murmurs, rubs, or gallops.  Respiratory: Clear to auscultation bilaterally.  Nonlabored, good air movement. GI: Soft, nontender, non-distended  MS: No edema; No deformity. Neuro:  Nonfocal.  CN II-XII grossly normal Psych: Normal affect   Labs    Chemistry Recent Labs  Lab 05/08/18 1223 05/14/18 1226 05/15/18 0610  NA 143 139 140  K 4.2 3.5 3.5  CL 99 103 106  CO2 _1 GLUCOSE 149* 96 91  BUN 34* 29* 24*  CREATININE 1.61* 1.86*  1.68*  CALCIUM 9.5 8.7* 8.5*  PROT  --  6.6  --   ALBUMIN  --  3.4*  --   AST  --  21  --   ALT  --  23  --   ALKPHOS  --  52  --   BILITOT  --  0.8  --   GFRNONAA 30* 24* 27*  GFRAA 34* 28* 32*  ANIONGAP  --  12 7     Hematology Recent Labs  Lab 05/14/18 1226  WBC 9.4  RBC 4.16  HGB 12.6  HCT 40.7  MCV 97.8  MCH 30.3  MCHC 31.0  RDW 14.7  PLT 178    Cardiac EnzymesNo results for input(s): TROPONINI in the last 168 hours.  Recent Labs  Lab 05/14/18 1234  TROPIPOC 0.00     BNP Recent Labs  Lab 05/14/18 1226  BNP 337.1*     DDimer No results for input(s): DDIMER in the last 168 hours.   Radiology    Dg Chest 2 View  Result Date: 05/14/2018 CLINICAL DATA:  Weakness EXAM: CHEST - 2 VIEW COMPARISON:  04/12/2018 FINDINGS: There is no focal parenchymal opacity. There is no pleural effusion or pneumothorax. The heart and mediastinal contours are unremarkable. The osseous structures are  unremarkable. IMPRESSION: No active cardiopulmonary disease. Electronically Signed   By: Kathreen Devoid   On: 05/14/2018 14:01   Ct Renal Stone Study  Result Date: 05/14/2018 CLINICAL DATA:  Constipation EXAM: CT ABDOMEN AND PELVIS WITHOUT CONTRAST TECHNIQUE: Multidetector CT imaging of the abdomen and pelvis was performed following the standard protocol without IV contrast. COMPARISON:  08/08/2011 FINDINGS: Lower chest: Coronary atherosclerotic calcification. Chronic scarring in the right lower lobe with volume loss. Chronic reticulation in the lower lobes without honeycombing. There is a 5 mm average diameter pulmonary nodule in the right lower lobe that is stable from 2012 and benign. Hepatobiliary: No focal liver abnormality.Cholecystectomy with normal common bile duct diameter Pancreas: Generalized atrophy. Spleen: Unremarkable. Adrenals/Urinary Tract: Negative adrenals. No hydronephrosis or stone. Bilateral renal sinus cysts. Unremarkable bladder. Stomach/Bowel: No obstruction. No  appendicitis. Left colonic diverticulosis. Calcification along the transverse mesocolon attributed to remote inflammation. Vascular/Lymphatic: No acute vascular abnormality. There is atherosclerotic calcification of the aorta. No mass or adenopathy. Reproductive:No pathologic findings. Other: No ascites or pneumoperitoneum. Fatty midline and right inguinal hernias Musculoskeletal: No acute abnormalities. Facet degeneration with L4-5 anterolisthesis. L2-3 advanced disc degeneration IMPRESSION: 1. No acute finding. No stool retention to correlate with history of constipation. 2. Chronic findings are described above. Electronically Signed   By: Monte Fantasia M.D.   On: 05/14/2018 16:05    Cardiac Studies   No new studies  2D echo 04/13/2018: EF 30 to 35%.  Mostly global hypokinesis with basal and mid inferoseptal, anteroseptal, anterior and apical septal/anterior wall akinesis.  LV "smoke" noted)  Patient Profile     84 y.o. female with a PMH of CAD s/p recent STEMI 03/2018 with PCI/DES to pLCx with plans for staged RCA intervention, ischemic cardiomyopathy with EF 40%, brief atrial fibrillation not on AC, and HLD, who presents with dizziness, nausea, and vomiting.  Denied any chest pain or pressure.  No dyspnea.  Basically poor p.o. intake for 2 to 3 days.  Had only been taking Lasix and spironolactone during that timeframe.  Assessment & Plan  1. Nausea/vomiting: Seems to have improved.  Probably still related to amiodarone which is still in her system.  Currently getting IV Phenergan for anti-emetic, will convert to oral with IV as back-up.  Felt better with IV hydration, continue to increase oral hydration  Would like to see that she has control of symptoms with oral medications prior to discharge. 2. CAD-PCI, recent STEMI with residual disease:   Restarted Plavix along with metoprolol. -Aspirin still on hold, however would like to restart prior to discharge  Statin currently on hold  because of symptoms (can we discussed an outpatient)  3. PAF: No further episodes noted since she has been on amiodarone.  May have only been related to ischemia.  Maintaining sinus rhythm off of amiodarone.  Beta-blocker still on board.  Had actually been started on Eliquis more for LV smoke on echo then for the one episode during hospitalization.  For now, would continue to hold until she is seen back in the outpatient setting.  We will just simply had stay on Plavix monotherapy  4. Chronic combined systolic and diastolic heart failure: Appears stable and euvolemic, if anything dry.    Diuretics on hold, and losartan has never been started. -->  Following discharge, would recommend using Lasix as needed weight gain more than 3 pounds or worsening dyspnea.  Only on low-dose beta-blocker (Toprol).   5. Orthostatic hypotension/dizziness  Feels better after hydration IV, but I  think she needs continue oral hydration.  Discussed importance of 10-12 8-10 ounce cups of water equivalent a day.  I suspect that she was probably dehydrated because of nausea vomiting and poor p.o. intake.  Suspect that as her hydration improves this will also improve.  Would like to have orthostatics rechecked at roughly 1:59 PM (allowing for time after eating).  If stable, would probably be okay to discharge from cardiac standpoint.  Dispo: If able to tolerate oral fluids today without requiring antiemetics, should be fine for discharge provided that she is no longer orthostatic.  Gentle hydration with may be 250 mL bolus would be acceptable if continues to be orthostatic on follow-up this afternoon. Would also need to confirm that she has an adequate antiemetic effect from oral Phenergan. -->  If these criteria met, she should be okay for discharge later on today.   For questions or updates, please contact [place] Please consult [email or web address] for contact info under        Signed, Glenetta Hew, MD    05/15/2018, 12:15 PM

## 2018-05-15 NOTE — Progress Notes (Addendum)
Progress Note    [name]  GNF:[id number] DOB: May 28, 1934  DOA: 05/14/2018 PCP: Daylene Katayama, PA    Brief Narrative:     Medical records reviewed and are as summarized below:  [name] is an 84 y.o. female with medical history significant of CKD 3, paroxysmal A. fib, CAD status post STEMI with PCI, chronic combined congestive heart failure, hyperlipidemia presenting to the hospital for evaluation of nausea and vomiting.  Patient states she has been having the symptoms for the past 3 weeks.  Patient has been found to be orthostatic and medications have been adjusted by cardiology.    Assessment/Plan:   Principal Problem:   AKI (acute kidney injury) (HCC) Active Problems:   PAF (paroxysmal atrial fibrillation) (HCC)   HLD (hyperlipidemia)   Acute on chronic combined systolic and diastolic congestive heart failure (HCC)   Nausea & vomiting   CKD (chronic kidney disease) stage 3, GFR 30-59 ml/min (HCC)   Orthostatic hypotension   CAD (coronary artery disease)  Orthostatic hypotension -gentle IVF/boluses and encourage PO intake -TED hose -recheck in AM -done x 2 today and were positive -appreciate cardiology consult by Dr. Herbie Baltimore:  Following discharge, would recommend using Lasix as needed weight gain more than 3 pounds or worsening dyspnea.  Only on low-dose beta-blocker (Toprol).  D/c amiodarone  continue plavix-- hold eliquis   N/V -improved at rest but came back when orthostatics done -PO phenergan PRN  Chronic combined congestive heart failure EF 30 to 35% on last echo done in October 2019. -see cardiology recommendations above  CKD stage III -baseline CR is around 1.5  obesity Body mass index is 33.64 kg/m.  Patient needs continued adjustments of her cardiac medications as well as management of her orthostatic vitals.  Still very symptomatic on recheck. Will admit to inpatient.  Family Communication/Anticipated D/C date and plan/Code  Status   DVT prophylaxis: Lovenox ordered. Code Status: dnr Family Communication: at bedside Disposition Plan: pending resolution of symptoms-- suspect in AM   Medical Consultants:    cards    Subjective:   Ate well and feels good at rest Nausea returns when getting out of bed and drop in BP  Objective:    Vitals:   05/14/18 1830 05/14/18 2050 05/15/18 0700 05/15/18 1200  BP:   (!) 103/52 (!) 106/54  Pulse: 83 83 61   Resp: 16 16 20 16   Temp:  98.2 F (36.8 C) 98.5 F (36.9 C)   TempSrc:  Oral Oral Oral  SpO2: 94% 93% 96% 93%  Weight:      Height:        Intake/Output Summary (Last 24 hours) at 05/15/2018 1455 Last data filed at 05/15/2018 0835 Gross per 24 hour  Intake 830.78 ml  Output -  Net 830.78 ml   Filed Weights   05/14/18 1151 05/14/18 1201  Weight: 88.9 kg 88.9 kg    Exam: In bed, pleasant and cooperative rrr +BS, soft, NT A+Ox3 Mood appropriate  Data Reviewed:   I have personally reviewed following labs and imaging studies:  Labs: Labs show the following:   Basic Metabolic Panel: Recent Labs  Lab 05/14/18 1226 05/15/18 0610  NA 139 140  K 3.5 3.5  CL 103 106  CO2 24 27  GLUCOSE 96 91  BUN 29* 24*  CREATININE 1.86* 1.68*  CALCIUM 8.7* 8.5*   GFR Estimated Creatinine Clearance: 27.9 mL/min (A) (by C-G formula based on SCr of 1.68 mg/dL (H)). Liver Function  Tests: Recent Labs  Lab 05/14/18 1226  AST 21  ALT 23  ALKPHOS 52  BILITOT 0.8  PROT 6.6  ALBUMIN 3.4*   No results for input(s): LIPASE, AMYLASE in the last 168 hours. No results for input(s): AMMONIA in the last 168 hours. Coagulation profile No results for input(s): INR, PROTIME in the last 168 hours.  CBC: Recent Labs  Lab 05/14/18 1226  WBC 9.4  NEUTROABS 6.3  HGB 12.6  HCT 40.7  MCV 97.8  PLT 178   Cardiac Enzymes: No results for input(s): CKTOTAL, CKMB, CKMBINDEX, TROPONINI in the last 168 hours. BNP (last 3 results) No results for input(s):  PROBNP in the last 8760 hours. CBG: No results for input(s): GLUCAP in the last 168 hours. D-Dimer: No results for input(s): DDIMER in the last 72 hours. Hgb A1c: No results for input(s): HGBA1C in the last 72 hours. Lipid Profile: No results for input(s): CHOL, HDL, LDLCALC, TRIG, CHOLHDL, LDLDIRECT in the last 72 hours. Thyroid function studies: No results for input(s): TSH, T4TOTAL, T3FREE, THYROIDAB in the last 72 hours.  Invalid input(s): FREET3 Anemia work up: No results for input(s): VITAMINB12, FOLATE, FERRITIN, TIBC, IRON, RETICCTPCT in the last 72 hours. Sepsis Labs: Recent Labs  Lab 05/14/18 1226  WBC 9.4    Microbiology No results found for this or any previous visit (from the past 240 hour(s)).  Procedures and diagnostic studies:  Dg Chest 2 View  Result Date: 05/14/2018 CLINICAL DATA:  Weakness EXAM: CHEST - 2 VIEW COMPARISON:  04/12/2018 FINDINGS: There is no focal parenchymal opacity. There is no pleural effusion or pneumothorax. The heart and mediastinal contours are unremarkable. The osseous structures are unremarkable. IMPRESSION: No active cardiopulmonary disease. Electronically Signed   By: Elige KoHetal  Patel   On: 05/14/2018 14:01   Ct Renal Stone Study  Result Date: 05/14/2018 CLINICAL DATA:  Constipation EXAM: CT ABDOMEN AND PELVIS WITHOUT CONTRAST TECHNIQUE: Multidetector CT imaging of the abdomen and pelvis was performed following the standard protocol without IV contrast. COMPARISON:  08/08/2011 FINDINGS: Lower chest: Coronary atherosclerotic calcification. Chronic scarring in the right lower lobe with volume loss. Chronic reticulation in the lower lobes without honeycombing. There is a 5 mm average diameter pulmonary nodule in the right lower lobe that is stable from 2012 and benign. Hepatobiliary: No focal liver abnormality.Cholecystectomy with normal common bile duct diameter Pancreas: Generalized atrophy. Spleen: Unremarkable. Adrenals/Urinary Tract:  Negative adrenals. No hydronephrosis or stone. Bilateral renal sinus cysts. Unremarkable bladder. Stomach/Bowel: No obstruction. No appendicitis. Left colonic diverticulosis. Calcification along the transverse mesocolon attributed to remote inflammation. Vascular/Lymphatic: No acute vascular abnormality. There is atherosclerotic calcification of the aorta. No mass or adenopathy. Reproductive:No pathologic findings. Other: No ascites or pneumoperitoneum. Fatty midline and right inguinal hernias Musculoskeletal: No acute abnormalities. Facet degeneration with L4-5 anterolisthesis. L2-3 advanced disc degeneration IMPRESSION: 1. No acute finding. No stool retention to correlate with history of constipation. 2. Chronic findings are described above. Electronically Signed   By: Marnee SpringJonathon  Watts M.D.   On: 05/14/2018 16:05    Medications:   . clopidogrel  75 mg Oral QHS  . enoxaparin (LOVENOX) injection  30 mg Subcutaneous Q24H  . metoprolol succinate  25 mg Oral Daily   Continuous Infusions: . sodium chloride 500 mL/hr at 05/15/18 1443  . sodium chloride       LOS: 0 days   Joseph ArtJessica U Aneliese Beaudry  [facility]   *Please refer to [email or web address], password [email or web address] to get updated schedule on who will round  on this patient, as hospitalists switch teams weekly. If 7PM-7AM, please contact night-coverage at [email or web address], password [email or web address] for any overnight needs.  05/15/2018, 2:55 PM

## 2018-05-16 DIAGNOSIS — I5043 Acute on chronic combined systolic (congestive) and diastolic (congestive) heart failure: Secondary | ICD-10-CM

## 2018-05-16 DIAGNOSIS — I25118 Atherosclerotic heart disease of native coronary artery with other forms of angina pectoris: Secondary | ICD-10-CM

## 2018-05-16 DIAGNOSIS — I480 Paroxysmal atrial fibrillation: Secondary | ICD-10-CM

## 2018-05-16 DIAGNOSIS — E860 Dehydration: Secondary | ICD-10-CM

## 2018-05-16 LAB — GLUCOSE, CAPILLARY
Glucose-Capillary: 85 mg/dL (ref 70–99)
Glucose-Capillary: 177 mg/dL — ABNORMAL HIGH (ref 70–99)
Glucose-Capillary: 112 mg/dL — ABNORMAL HIGH (ref 70–99)
Glucose-Capillary: 140 mg/dL — ABNORMAL HIGH (ref 70–99)

## 2018-05-16 MED ORDER — SODIUM CHLORIDE 0.9 % IV BOLUS
250.0000 mL | Freq: Once | INTRAVENOUS | Status: AC
  Administered 2018-05-16: 250 mL via INTRAVENOUS

## 2018-05-16 MED ORDER — METOPROLOL SUCCINATE ER 25 MG PO TB24
12.5000 mg | ORAL_TABLET | Freq: Every day | ORAL | Status: DC
  Administered 2018-05-17: 12.5 mg via ORAL
  Filled 2018-05-16: qty 1

## 2018-05-16 MED ORDER — TICAGRELOR 90 MG PO TABS
180.0000 mg | ORAL_TABLET | Freq: Once | ORAL | Status: AC
  Administered 2018-05-16: 180 mg via ORAL
  Filled 2018-05-16: qty 2

## 2018-05-16 MED ORDER — TICAGRELOR 90 MG PO TABS: 180.0000 mg | ORAL_TABLET | Freq: Once | ORAL | Status: DC

## 2018-05-16 MED ORDER — CLOPIDOGREL BISULFATE 75 MG PO TABS: 75.0000 mg | ORAL_TABLET | Freq: Once | ORAL | Status: DC

## 2018-05-16 MED ORDER — TICAGRELOR 90 MG PO TABS
90.0000 mg | ORAL_TABLET | Freq: Two times a day (BID) | ORAL | Status: DC
  Administered 2018-05-17: 90 mg via ORAL
  Filled 2018-05-16: qty 1

## 2018-05-16 NOTE — Care Management (Signed)
#   7.   S/W  [facility] @ [facility] # [id number] OPT- 2   BRILINTA  180 MG : NONE FORMULARY  1. BRILINTA  60 MG   X 3  BEGINNING COVER- YES CO-PAY- $ 45.00  TIER- 3 DRUG PRIOR APPROVAL- NO   2. BRILINTA  90 MG  BID  COVER- YES CO-PAY- $ 45.00 TIER- 3 DRUG PRIOR APPROVAL- NO  PREFERRED PHARMACY : YES [facility]

## 2018-05-16 NOTE — Progress Notes (Signed)
Progress Note  Patient Name: [name] Date of Encounter: 05/16/2018  Primary Cardiologist: Lance Muss, MD   Subjective   Felt great this morning.  Ate dinner last night and this morning.  However after getting up and bathing she felt a little bit nauseated.  Seems to have noted relief with Phenergan.  Inpatient Medications    Scheduled Meds: . clopidogrel  75 mg Oral QHS  . enoxaparin (LOVENOX) injection  30 mg Subcutaneous Q24H  . metoprolol succinate  25 mg Oral Daily   Continuous Infusions: . sodium chloride     PRN Meds: acetaminophen, promethazine   Vital Signs    Orthostatic VS for the past 24 hrs (Last 3 readings):  BP- Lying Pulse- Lying BP- Sitting Pulse- Sitting BP- Standing at 0 minutes Pulse- Standing at 0 minutes BP- Standing at 3 minutes Pulse- Standing at 3 minutes  05/16/18 0538 118/68 67 112/68 73 115/60 80 125/81 79  05/15/18 1427 104/55 66 95/54 71 (!) 77/55 78 (!) 82/46 80   Vitals:   05/16/18 0437 05/16/18 0917 05/16/18 1158 05/16/18 1240  BP: (!) 115/57 119/66 (!) 92/55 (!) 103/56  Pulse: 68 83 68 68  Resp: 18  17   Temp: 98.7 F (37.1 C)  98.4 F (36.9 C)   TempSrc: Oral  Oral   SpO2: 90% 94% 92% 92%  Weight: 91 kg     Height:        Intake/Output Summary (Last 24 hours) at 05/16/2018 1253 Last data filed at 05/16/2018 1200 Gross per 24 hour  Intake 480 ml  Output 700 ml  Net -220 ml   Filed Weights   05/14/18 1201 05/15/18 1831 05/16/18 0437  Weight: 88.9 kg 91.4 kg 91 kg    Telemetry    Remains in sinus rhythm at a rate in 60s to 70s.  There is a section and telemetry noted SVT which is simply artifact.- Personally Reviewed  ECG    No new study- Personally Reviewed  Physical Exam   General appearance: alert, cooperative, appears stated age, no distress and Smiling, notes that nausea has improved. Neck: no adenopathy, no carotid bruit and no JVD Lungs: clear to auscultation bilaterally, normal percussion  bilaterally and Nonlabored, good air movement Heart: regular rate and rhythm, S1, S2 normal, no murmur, click, rub or gallop and normal apical impulse Abdomen: soft, non-tender; bowel sounds normal; no masses,  no organomegaly Extremities: extremities normal, atraumatic, no cyanosis or edema Pulses: 2+ and symmetric Neurologic: Grossly normal   Labs    Chemistry Recent Labs  Lab 05/14/18 1226 05/15/18 0610  NA 139 140  K 3.5 3.5  CL 103 106  CO2 24 27  GLUCOSE 96 91  BUN 29* 24*  CREATININE 1.86* 1.68*  CALCIUM 8.7* 8.5*  PROT 6.6  --   ALBUMIN 3.4*  --   AST 21  --   ALT 23  --   ALKPHOS 52  --   BILITOT 0.8  --   GFRNONAA 24* 27*  GFRAA 28* 32*  ANIONGAP 12 7     Hematology Recent Labs  Lab 05/14/18 1226  WBC 9.4  RBC 4.16  HGB 12.6  HCT 40.7  MCV 97.8  MCH 30.3  MCHC 31.0  RDW 14.7  PLT 178    Cardiac EnzymesNo results for input(s): TROPONINI in the last 168 hours.  Recent Labs  Lab 05/14/18 1234  TROPIPOC 0.00     BNP Recent Labs  Lab 05/14/18 1226  BNP 337.1*     DDimer No results for input(s): DDIMER in the last 168 hours.   Radiology    Dg Chest 2 View  Result Date: 05/14/2018 CLINICAL DATA:  Weakness EXAM: CHEST - 2 VIEW COMPARISON:  04/12/2018 FINDINGS: There is no focal parenchymal opacity. There is no pleural effusion or pneumothorax. The heart and mediastinal contours are unremarkable. The osseous structures are unremarkable. IMPRESSION: No active cardiopulmonary disease. Electronically Signed   By: Elige KoHetal  Patel   On: 05/14/2018 14:01   Ct Renal Stone Study  Result Date: 05/14/2018 CLINICAL DATA:  Constipation EXAM: CT ABDOMEN AND PELVIS WITHOUT CONTRAST TECHNIQUE: Multidetector CT imaging of the abdomen and pelvis was performed following the standard protocol without IV contrast. COMPARISON:  08/08/2011 FINDINGS: Lower chest: Coronary atherosclerotic calcification. Chronic scarring in the right lower lobe with volume loss.  Chronic reticulation in the lower lobes without honeycombing. There is a 5 mm average diameter pulmonary nodule in the right lower lobe that is stable from 2012 and benign. Hepatobiliary: No focal liver abnormality.Cholecystectomy with normal common bile duct diameter Pancreas: Generalized atrophy. Spleen: Unremarkable. Adrenals/Urinary Tract: Negative adrenals. No hydronephrosis or stone. Bilateral renal sinus cysts. Unremarkable bladder. Stomach/Bowel: No obstruction. No appendicitis. Left colonic diverticulosis. Calcification along the transverse mesocolon attributed to remote inflammation. Vascular/Lymphatic: No acute vascular abnormality. There is atherosclerotic calcification of the aorta. No mass or adenopathy. Reproductive:No pathologic findings. Other: No ascites or pneumoperitoneum. Fatty midline and right inguinal hernias Musculoskeletal: No acute abnormalities. Facet degeneration with L4-5 anterolisthesis. L2-3 advanced disc degeneration IMPRESSION: 1. No acute finding. No stool retention to correlate with history of constipation. 2. Chronic findings are described above. Electronically Signed   By: Marnee SpringJonathon  Watts M.D.   On: 05/14/2018 16:05    Cardiac Studies   No new studies  2D echo 04/13/2018: EF 30 to 35%.  Mostly global hypokinesis with basal and mid inferoseptal, anteroseptal, anterior and apical septal/anterior wall akinesis.  LV "smoke" noted)  Patient Profile     84 y.o. female with a PMH of CAD s/p recent STEMI 03/2018 with PCI/DES to pLCx with plans for staged RCA intervention, ischemic cardiomyopathy with EF 40%, brief atrial fibrillation not on AC, and HLD, who presents with dizziness, nausea, and vomiting.  Denied any chest pain or pressure.  No dyspnea.  Basically poor p.o. intake for 2 to 3 days.  Had only been taking Lasix and spironolactone during that timeframe.  Assessment & Plan  1. Nausea/vomiting: Overall improved, was doing fine up until this morning had an episode  of nausea.  Currently feeling better with oral Phenergan. Seems to have improved.  Probably still related to amiodarone which is still in her system.  Would prescribe PRN oral Phenergan for discharge.  Gentle IV bolus today and continue oral hydration.  For now continue to hold amiodarone.    With persistent symptoms, would convert back from Plavix to Brilinta for discharge (based on discussion with Dr. Eldridge DaceVaranasi.  Will need to explain this to the patient and family as this was a discussion following my rounding on the patient)  2. CAD-PCI, recent STEMI with residual disease:   Had been converted from Brilinta to Plavix (but plan is converting back to Brilinta -starting tomorrow.  First dose Brilinta would be 180 mg followed by 90 mg twice daily beginning tomorrow night.).    Continue reduced dose of Toprol (12.5 mg).  Continue to hold aspirin and statin.  Reassess in the outpatient setting.  3. PAF: No further  episodes noted since she has been on amiodarone.  May have only been related to ischemia.  Beta-blocker still on board but reducing dose..  No longer on Eliquis.  May have better protective benefit from Brilinta over Plavix.  Eliquis was actually started more for the LV "smoke" --> will eventually need to consider going back to Plavix plus Eliquis once she is stabilized.  4. Chronic combined systolic and diastolic heart failure: Euvolemic-dry  Continue to hold diuretic and ARB.  Would only use Lasix as needed for weight gain greater than 3 pounds or worsening dyspnea.  Only on low-dose Toprol  5. Orthostatic hypotension/dizziness --still had orthostatic pressures this morning.3  Will bolus 250 mL normal saline today, and continue to encourage adequate hydration (10-12 8-10 ounce cups of water equivalent a day)  Ambulate in the hallway today.  Continue to monitor orthostatics.    Reduce home dose/discharge dose of Toprol to 12.5 mg.  If symptoms persist, may need to  consider midodrine    Dispo: Now able to tolerate food.  Also seems to be having adequate beneficial effect of oral Phenergan. -Continues to be orthostatic, but has had minimal oral intake and is probably net out. ->  250 mL bolus.  Increase p.o. oral intake.  If stable ambulating in the hallway today.  Would be okay for discharge home.  [facility] will sign off.   Medication Recommendations: Reduce home dose of Toprol to 12.5 mg; convert from Plavix to Brilinta (180 mg p.o. X 1 beginning 11/23 followed by 90 mg twice daily beginning p.m. 11/23 ) Other recommendations (labs, testing, etc): BMP -prior to clinic follow-up Follow up as an outpatient: 1-2 weeks.    For questions or updates, please contact [facility] Please consult [email or web address] for contact info under        Signed, Bryan Lemma, MD  05/16/2018, 12:53 PM

## 2018-05-16 NOTE — Progress Notes (Signed)
Patient walked  To nurses station and back to room .  Patient denies shortness of breath, dizziness, chest pain, or nausea.    05/16/18 1526  Vitals  Temp 97.7 F (36.5 C)  Temp Source Oral  BP (!) 104/53  MAP (mmHg) 69  BP Location Right Arm  BP Method Automatic  Patient Position (if appropriate) Sitting  Pulse Rate 63  Pulse Rate Source Monitor  Oxygen Therapy  SpO2 94 %  O2 Device Room Air

## 2018-05-16 NOTE — Progress Notes (Signed)
Progress Note    Kelly HolsterSandra S Kurtz  ZOX:[id number] DOB: 1934/01/01  DOA: 05/14/2018 PCP: Daylene KatayamaGordnier, [name], PA    Brief Narrative:     Medical records reviewed and are as summarized below:  [name] is an 84 y.o. female with medical history significant of CKD 3, paroxysmal A. fib, CAD status post STEMI with PCI, chronic combined congestive heart failure, hyperlipidemia presenting to the hospital for evaluation of nausea and vomiting.  Patient states she has been having the symptoms for the past 3 weeks.  Patient has been found to be orthostatic and medications have been adjusted by cardiology.    Assessment/Plan:   Principal Problem:   AKI (acute kidney injury) (HCC) Active Problems:   PAF (paroxysmal atrial fibrillation) (HCC)   HLD (hyperlipidemia)   Acute on chronic combined systolic and diastolic congestive heart failure (HCC)   Nausea & vomiting   CKD (chronic kidney disease) stage 3, GFR 30-59 ml/min (HCC)   Orthostatic hypotension   CAD (coronary artery disease)   Dehydration  Orthostatic hypotension Improving, repeat orthostatic negative this evening, but pt felt dizzy when she stood up -gentle IVF/boluses and encourage PO intake -TED hose -appreciate cardiology consult by Dr. Herbie BaltimoreHarding:  Following discharge, would recommend using Lasix as needed weight gain more than 3 pounds or worsening dyspnea.  Only on low-dose beta-blocker (Toprol).  Holdsamiodarone  D/C plavix, eliquis-->start brillanta   N/V Improving, still with some nausea, no vomiting -PO phenergan PRN Encourage PO intake  Paroxysmal A. Fib Currently rate controlled Cardiology on board, continue low-dose Toprol Currently no longer on Eliquis as per cardiology, start Brilinta Follow-up as an outpatient  CAD with recent STEMI Chest pain free Cardiology on board: Convert back to brilinta, continue to hold asa, statin Follow up as outpatient  Chronic combined systolic and diastolic heart  failure EF 30 to 35% on last echo done in October 2019. Lasix as needed for weight gain greater than 3 pounds or worsening dyspnea Continue to hold diuretics and ARB Low-dose Toprol  AKI on CKD stage III Slowly improving Likely due to poor oral intake, in addition to taking her Lasix and spironolactone -baseline CR is around 1.2 Repeat BMP in a.m. to ensure it is trending down, avoid nephrotoxic's, encourage p.o. intake  Obesity Body mass index is 34.43 kg/m.   Family Communication/Anticipated D/C date and plan/Code Status   DVT prophylaxis: Lovenox  Code Status: dnr Family Communication: None at bedside Disposition Plan: Plan to DC home on 05/17/2018   Medical Consultants:    cards    Subjective:   Reports feeling better, still complained of some dizziness on standing.  Still with some nausea but denies any vomiting.  Denies any chest pain, worsening shortness of breath, abdominal pain, fever/chills.  Objective:    Vitals:   05/16/18 1158 05/16/18 1240 05/16/18 1500 05/16/18 1526  BP: (!) 92/55 (!) 103/56  (!) 104/53  Pulse: 68 68  63  Resp: 17     Temp: 98.4 F (36.9 C)   97.7 F (36.5 C)  TempSrc: Oral   Oral  SpO2: 92% 92% 95% 94%  Weight:      Height:        Intake/Output Summary (Last 24 hours) at 05/16/2018 1550 Last data filed at 05/16/2018 1500 Gross per 24 hour  Intake 723.5 ml  Output 900 ml  Net -176.5 ml   Filed Weights   05/14/18 1201 05/15/18 1831 05/16/18 0437  Weight: 88.9 kg 91.4 kg 91  kg    Exam:  General: NAD   Cardiovascular: S1, S2 present  Respiratory: CTAB  Abdomen: Soft, nontender, nondistended, bowel sounds present  Musculoskeletal: No bilateral pedal edema noted  Skin: Normal  Psychiatry:  Normal mood   Data Reviewed:   I have personally reviewed following labs and imaging studies:  Labs: Labs show the following:   Basic Metabolic Panel: Recent Labs  Lab 05/14/18 1226 05/15/18 0610  NA 139 140  K  3.5 3.5  CL 103 106  CO2 24 27  GLUCOSE 96 91  BUN 29* 24*  CREATININE 1.86* 1.68*  CALCIUM 8.7* 8.5*   GFR Estimated Creatinine Clearance: 28.2 mL/min (A) (by C-G formula based on SCr of 1.68 mg/dL (H)). Liver Function Tests: Recent Labs  Lab 05/14/18 1226  AST 21  ALT 23  ALKPHOS 52  BILITOT 0.8  PROT 6.6  ALBUMIN 3.4*   No results for input(s): LIPASE, AMYLASE in the last 168 hours. No results for input(s): AMMONIA in the last 168 hours. Coagulation profile No results for input(s): INR, PROTIME in the last 168 hours.  CBC: Recent Labs  Lab 05/14/18 1226  WBC 9.4  NEUTROABS 6.3  HGB 12.6  HCT 40.7  MCV 97.8  PLT 178   Cardiac Enzymes: No results for input(s): CKTOTAL, CKMB, CKMBINDEX, TROPONINI in the last 168 hours. BNP (last 3 results) No results for input(s): PROBNP in the last 8760 hours. CBG: Recent Labs  Lab 05/15/18 2055 05/16/18 0813 05/16/18 1156  GLUCAP 107* 85 177*   D-Dimer: No results for input(s): DDIMER in the last 72 hours. Hgb A1c: No results for input(s): HGBA1C in the last 72 hours. Lipid Profile: No results for input(s): CHOL, HDL, LDLCALC, TRIG, CHOLHDL, LDLDIRECT in the last 72 hours. Thyroid function studies: No results for input(s): TSH, T4TOTAL, T3FREE, THYROIDAB in the last 72 hours.  Invalid input(s): FREET3 Anemia work up: No results for input(s): VITAMINB12, FOLATE, FERRITIN, TIBC, IRON, RETICCTPCT in the last 72 hours. Sepsis Labs: Recent Labs  Lab 05/14/18 1226  WBC 9.4    Microbiology No results found for this or any previous visit (from the past 240 hour(s)).  Procedures and diagnostic studies:  Ct Renal Stone Study  Result Date: 05/14/2018 CLINICAL DATA:  Constipation EXAM: CT ABDOMEN AND PELVIS WITHOUT CONTRAST TECHNIQUE: Multidetector CT imaging of the abdomen and pelvis was performed following the standard protocol without IV contrast. COMPARISON:  08/08/2011 FINDINGS: Lower chest: Coronary  atherosclerotic calcification. Chronic scarring in the right lower lobe with volume loss. Chronic reticulation in the lower lobes without honeycombing. There is a 5 mm average diameter pulmonary nodule in the right lower lobe that is stable from 2012 and benign. Hepatobiliary: No focal liver abnormality.Cholecystectomy with normal common bile duct diameter Pancreas: Generalized atrophy. Spleen: Unremarkable. Adrenals/Urinary Tract: Negative adrenals. No hydronephrosis or stone. Bilateral renal sinus cysts. Unremarkable bladder. Stomach/Bowel: No obstruction. No appendicitis. Left colonic diverticulosis. Calcification along the transverse mesocolon attributed to remote inflammation. Vascular/Lymphatic: No acute vascular abnormality. There is atherosclerotic calcification of the aorta. No mass or adenopathy. Reproductive:No pathologic findings. Other: No ascites or pneumoperitoneum. Fatty midline and right inguinal hernias Musculoskeletal: No acute abnormalities. Facet degeneration with L4-5 anterolisthesis. L2-3 advanced disc degeneration IMPRESSION: 1. No acute finding. No stool retention to correlate with history of constipation. 2. Chronic findings are described above. Electronically Signed   By: Marnee Spring M.D.   On: 05/14/2018 16:05    Medications:   . clopidogrel  75 mg Oral QHS  .  enoxaparin (LOVENOX) injection  30 mg Subcutaneous Q24H  . metoprolol succinate  25 mg Oral Daily   Continuous Infusions:    LOS: 1 day   Briant Cedar  [facility]   *Please refer to [email or web address], password [email or web address] to get updated schedule on who will round on this patient, as hospitalists switch teams weekly. If 7PM-7AM, please contact night-coverage at [email or web address], password [email or web address] for any overnight needs.  05/16/2018, 3:50 PM

## 2018-05-16 NOTE — Progress Notes (Signed)
Orthostatic vitals are as follows:   05/16/18 1500  Orthostatic Lying   BP- Lying 96/55  Pulse- Lying 63  Orthostatic Sitting  BP- Sitting (!) 83/65  Pulse- Sitting 68  Orthostatic Standing at 0 minutes  BP- Standing at 0 minutes 91/76  Pulse- Standing at 0 minutes 77  Orthostatic Standing at 3 minutes  BP- Standing at 3 minutes 99/67  Pulse- Standing at 3 minutes 75  Oxygen Therapy  SpO2 95 %  O2 Device Room Air    patient stated " I feel Woozy when standing". Patient denies chest pain, shortness of breath or nausea.

## 2018-05-16 NOTE — Progress Notes (Signed)
Nutrition Brief Note  Patient identified on the Malnutrition Screening Tool (MST) Report  Wt Readings from Last 15 Encounters:  05/16/18 91 kg  05/08/18 90.7 kg  04/24/18 91.9 kg  04/16/18 92.7 kg  04/08/18 94.7 kg  08/21/11 84.6 kg  08/08/11 88.5 kg   Kelly HolsterSandra S [name] is a 84 y.o. female with medical history significant of CKD 3, paroxysmal A. fib, CAD status post STEMI with PCI, chronic combined congestive heart failure, hyperlipidemia presenting to the hospital for evaluation of nausea and vomiting.  Patient states she has been having the symptoms for the past 3 weeks.  Denies having any abdominal pain, diarrhea, constipation, fevers, or chills.  Reports having a low appetite.  Denies having any chest pain or shortness of breath.  No other complaints.  Pt admitted with AKI and anausea and vomiting.   Observed lunch tray prior to visiting pt, which was 100% consumed.   Spoke with pt at bedside, who reports decreased appetite for the past 3 weeks due to nausea and generally not feeling like eating. She reports appetite has improved greatly and nausea has been resolved since hospitalization ("it's that phenergan"). Pt reports over the past 3 weeks (reports she consumed only milk and graham crackers at this time, however, RD questions accuracy of diet recall). Pt also shares she has lost 10# within the past 3 weeks, which is not consistent with wt hx.   Nutrition-Focused physical exam completed. Findings are no fat depletion, mild muscle depletion, and no edema. Pt with generalized muscle depletion in upper and lower extremities which is common in geriatric patients. Pt denies any changes in mobility, states she is "old and slow" at baseline.  Labs reviewed: CBGS: 85-107.   Body mass index is 34.43 kg/m. Patient meets criteria for obesity, class I based on current BMI.   Current diet order is regular, patient is consuming approximately 100% of meals at this time. Labs and medications  reviewed.   No nutrition interventions warranted at this time. If nutrition issues arise, please consult RD.   Adeli Frost A. Mayford KnifeWilliams, RD, LDN, CDE Pager: [phone] After hours Pager: [phone]

## 2018-05-16 NOTE — Progress Notes (Signed)
Patient is states : " I have nausea can I have something for nausea"  Phenergan PRN 12.5mg  administered.    05/16/18 1240  Vitals  BP (!) 103/56  MAP (mmHg) 70  BP Location Right Arm  BP Method Automatic  Patient Position (if appropriate) Lying  Pulse Rate 68  Pulse Rate Source Dinamap  Oxygen Therapy  SpO2 92 %  O2 Device Room Air

## 2018-05-17 DIAGNOSIS — N183 Chronic kidney disease, stage 3 (moderate): Secondary | ICD-10-CM

## 2018-05-17 DIAGNOSIS — E782 Mixed hyperlipidemia: Secondary | ICD-10-CM

## 2018-05-17 LAB — BASIC METABOLIC PANEL
ANION GAP: 8 (ref 5–15)
BUN: 18 mg/dL (ref 8–23)
CALCIUM: 8.4 mg/dL — AB (ref 8.9–10.3)
CO2: 24 mmol/L (ref 22–32)
CREATININE: 1.14 mg/dL — AB (ref 0.44–1.00)
Chloride: 106 mmol/L (ref 98–111)
GFR, EST AFRICAN AMERICAN: 50 mL/min — AB (ref >60)
GFR, EST NON AFRICAN AMERICAN: 44 mL/min — AB (ref >60)
GLUCOSE: 101 mg/dL — AB (ref 70–99)
Potassium: 4.2 mmol/L (ref 3.5–5.1)
Sodium: 138 mmol/L (ref 135–145)

## 2018-05-17 LAB — GLUCOSE, CAPILLARY
GLUCOSE-CAPILLARY: 139 mg/dL — AB (ref 70–99)
Glucose-Capillary: 99 mg/dL (ref 70–99)

## 2018-05-17 MED ORDER — TICAGRELOR 90 MG PO TABS: 90.0000 mg | ORAL_TABLET | Freq: Two times a day (BID) | ORAL | 0 refills | Status: DC

## 2018-05-17 MED ORDER — PROMETHAZINE HCL 12.5 MG PO TABS: 12.5000 mg | ORAL_TABLET | Freq: Four times a day (QID) | ORAL | 0 refills | Status: DC | PRN

## 2018-05-17 MED ORDER — METOPROLOL SUCCINATE ER 25 MG PO TB24: 12.5000 mg | ORAL_TABLET | Freq: Every day | ORAL | 0 refills | Status: DC

## 2018-05-17 NOTE — Discharge Summary (Signed)
Discharge Summary  [name] WUJ:[id number] DOB: 1934-05-12  PCP: Daylene Katayama, PA  Admit date: 05/14/2018 Discharge date: 05/17/2018  Time spent: 35 mins  Recommendations for Outpatient Follow-up:  1. PCP 2. Cardiology  Discharge Diagnoses:  Active Hospital Problems   Diagnosis Date Noted  . AKI (acute kidney injury) (HCC) 05/14/2018  . CKD (chronic kidney disease) stage 3, GFR 30-59 ml/min (HCC) 05/15/2018  . Orthostatic hypotension 05/15/2018  . CAD (coronary artery disease) 05/15/2018  . Dehydration 05/15/2018  . Nausea & vomiting 05/14/2018  . Acute on chronic combined systolic and diastolic congestive heart failure (HCC) 04/12/2018  . PAF (paroxysmal atrial fibrillation) (HCC) 04/08/2018  . HLD (hyperlipidemia) 04/08/2018    Resolved Hospital Problems  No resolved problems to display.    Discharge Condition: Stable  Diet recommendation: Heart healthy  Vitals:   05/17/18 1219 05/17/18 1222  BP:    Pulse: 91 86  Resp:    Temp:    SpO2:      History of present illness:  [name] is an 84 y.o. female with medical history significant ofCKD 3, paroxysmal A. fib, CAD status post STEMI with PCI, chronic combined congestive heart failure, hyperlipidemia presenting to the hospital for evaluation of nausea and vomiting. Patient states she has been having the symptoms for the past 3 weeks.  Patient has been found to be orthostatic and medications have been adjusted by cardiology.   Today, patient reports feeling better, denies any dizziness, orthostatic negative this a.m.  Patient denies any chest pain, shortness of breath, abdominal pain, fever/chills.  Hospital Course:  Principal Problem:   AKI (acute kidney injury) (HCC) Active Problems:   PAF (paroxysmal atrial fibrillation) (HCC)   HLD (hyperlipidemia)   Acute on chronic combined systolic and diastolic congestive heart failure (HCC)   Nausea & vomiting   CKD (chronic kidney disease) stage 3,  GFR 30-59 ml/min (HCC)   Orthostatic hypotension   CAD (coronary artery disease)   Dehydration  Orthostatic hypotension Resolved S/P TED hose + gentle hydration Appreciate cardiology consult by Dr. Herbie Baltimore:  Following discharge, would recommend using Lasix as needed weight gain more than 3 pounds or worsening dyspnea.  Only on low-dose beta-blocker (Toprol).  Hold amiodarone  D/C plavix, eliquis-->start brillanta  N/V Improved PO phenergan prn  Paroxysmal A. Fib Currently rate controlled Cardiology on board, continue low-dose Toprol Currently no longer on Eliquis as per cardiology, start Brilinta Follow-up as an outpatient  CAD with recent STEMI Chest pain free Cardiology on board: switch back to brilinta, continue to hold asa, statin Follow up as outpatient  Chronic combined systolic and diastolic heart failure EF 30 to 35% on last echo done in October 2019. Lasix as needed for weight gain greater than 3 pounds or worsening dyspnea Continue to hold diuretics and ARB Low-dose Toprol  AKI on CKD stage III Resolved, cr at baseline Likely due to poor oral intake, in addition to taking her Lasix and spironolactone PCP/Cardiology to follow up  Obesity Body mass index is 34.43 kg/m.   Procedures:  None  Consultations:  Cardiology  Discharge Exam: BP 112/72 (BP Location: Right Arm)   Pulse 86   Temp 98.7 F (37.1 C) (Oral)   Resp 18   Ht 5\' 4"  (1.626 m)   Wt 91.6 kg Comment: scale a  SpO2 94%   BMI 34.67 kg/m   General: NAD Cardiovascular: S1, S2 present Respiratory: CTAB  Discharge Instructions You were cared for by a hospitalist during your  hospital stay. If you have any questions about your discharge medications or the care you received while you were in the hospital after you are discharged, you can call the unit and asked to speak with the hospitalist on call if the hospitalist that took care of you is not available. Once you are  discharged, your primary care physician will handle any further medical issues. Please note that NO REFILLS for any discharge medications will be authorized once you are discharged, as it is imperative that you return to your primary care physician (or establish a relationship with a primary care physician if you do not have one) for your aftercare needs so that they can reassess your need for medications and monitor your lab values.   Allergies as of 05/17/2018      Reactions   Cymbalta [duloxetine Hcl] Other (See Comments)   "Made me feel crazy"      Medication List    STOP taking these medications   amiodarone 200 MG tablet Commonly known as:  PACERONE   atorvastatin 80 MG tablet Commonly known as:  LIPITOR   clopidogrel 75 MG tablet Commonly known as:  PLAVIX   furosemide 40 MG tablet Commonly known as:  LASIX   isosorbide mononitrate 30 MG 24 hr tablet Commonly known as:  IMDUR   losartan 25 MG tablet Commonly known as:  COZAAR   spironolactone 25 MG tablet Commonly known as:  ALDACTONE   traMADol 50 MG tablet Commonly known as:  ULTRAM     TAKE these medications   acetaminophen 325 MG tablet Commonly known as:  TYLENOL Take 650 mg by mouth every 6 (six) hours as needed (for back pain).   metoprolol succinate 25 MG 24 hr tablet Commonly known as:  TOPROL-XL Take 0.5 tablets (12.5 mg total) by mouth daily. Start taking on:  05/18/2018 What changed:  how much to take   nitroGLYCERIN 0.4 MG SL tablet Commonly known as:  NITROSTAT Place 1 tablet (0.4 mg total) under the tongue every 5 (five) minutes as needed for chest pain.   ondansetron 4 MG disintegrating tablet Commonly known as:  ZOFRAN-ODT Take 4 mg by mouth every 8 (eight) hours as needed for nausea or vomiting.   promethazine 12.5 MG tablet Commonly known as:  PHENERGAN Take 1 tablet (12.5 mg total) by mouth every 6 (six) hours as needed for up to 7 days for nausea or vomiting.   ticagrelor 90 MG  Tabs tablet Commonly known as:  BRILINTA Take 1 tablet (90 mg total) by mouth 2 (two) times daily.      Allergies  Allergen Reactions  . Cymbalta [Duloxetine Hcl] Other (See Comments)    "Made me feel crazy"   Follow-up Information    Corky Crafts, MD. Schedule an appointment as soon as possible for a visit in 1 week(s).   Specialties:  Cardiology, Radiology, Interventional Cardiology Contact information: [address] [place] [address] [phone]        Daylene Katayama, [place]. Schedule an appointment as soon as possible for a visit in 1 week(s).   Specialty:  Family Medicine Contact information: [address] [place] [address] [phone]            The results of significant diagnostics from this hospitalization (including imaging, microbiology, ancillary and laboratory) are listed below for reference.    Significant Diagnostic Studies: Dg Chest 2 View  Result Date: 05/14/2018 CLINICAL DATA:  Weakness EXAM: CHEST -  2 VIEW COMPARISON:  04/12/2018 FINDINGS: There is no focal parenchymal opacity. There is no pleural effusion or pneumothorax. The heart and mediastinal contours are unremarkable. The osseous structures are unremarkable. IMPRESSION: No active cardiopulmonary disease. Electronically Signed   By: Elige KoHetal  Patel   On: 05/14/2018 14:01   Ct Renal Stone Study  Result Date: 05/14/2018 CLINICAL DATA:  Constipation EXAM: CT ABDOMEN AND PELVIS WITHOUT CONTRAST TECHNIQUE: Multidetector CT imaging of the abdomen and pelvis was performed following the standard protocol without IV contrast. COMPARISON:  08/08/2011 FINDINGS: Lower chest: Coronary atherosclerotic calcification. Chronic scarring in the right lower lobe with volume loss. Chronic reticulation in the lower lobes without honeycombing. There is a 5 mm average diameter pulmonary nodule in the right lower lobe that is stable from 2012 and benign. Hepatobiliary: No  focal liver abnormality.Cholecystectomy with normal common bile duct diameter Pancreas: Generalized atrophy. Spleen: Unremarkable. Adrenals/Urinary Tract: Negative adrenals. No hydronephrosis or stone. Bilateral renal sinus cysts. Unremarkable bladder. Stomach/Bowel: No obstruction. No appendicitis. Left colonic diverticulosis. Calcification along the transverse mesocolon attributed to remote inflammation. Vascular/Lymphatic: No acute vascular abnormality. There is atherosclerotic calcification of the aorta. No mass or adenopathy. Reproductive:No pathologic findings. Other: No ascites or pneumoperitoneum. Fatty midline and right inguinal hernias Musculoskeletal: No acute abnormalities. Facet degeneration with L4-5 anterolisthesis. L2-3 advanced disc degeneration IMPRESSION: 1. No acute finding. No stool retention to correlate with history of constipation. 2. Chronic findings are described above. Electronically Signed   By: Marnee SpringJonathon  Watts M.D.   On: 05/14/2018 16:05    Microbiology: No results found for this or any previous visit (from the past 240 hour(s)).   Labs: Basic Metabolic Panel: Recent Labs  Lab 05/14/18 1226 05/15/18 0610 05/17/18 0538  NA 139 140 138  K 3.5 3.5 4.2  CL 103 106 106  CO2 24 27 24   GLUCOSE 96 91 101*  BUN 29* 24* 18  CREATININE 1.86* 1.68* 1.14*  CALCIUM 8.7* 8.5* 8.4*   Liver Function Tests: Recent Labs  Lab 05/14/18 1226  AST 21  ALT 23  ALKPHOS 52  BILITOT 0.8  PROT 6.6  ALBUMIN 3.4*   No results for input(s): LIPASE, AMYLASE in the last 168 hours. No results for input(s): AMMONIA in the last 168 hours. CBC: Recent Labs  Lab 05/14/18 1226  WBC 9.4  NEUTROABS 6.3  HGB 12.6  HCT 40.7  MCV 97.8  PLT 178   Cardiac Enzymes: No results for input(s): CKTOTAL, CKMB, CKMBINDEX, TROPONINI in the last 168 hours. BNP: BNP (last 3 results) Recent Labs    04/12/18 1009 05/14/18 1226  BNP 1,045.0* 337.1*    ProBNP (last 3 results) No results for  input(s): PROBNP in the last 8760 hours.  CBG: Recent Labs  Lab 05/16/18 1156 05/16/18 1829 05/16/18 2119 05/17/18 0745 05/17/18 1141  GLUCAP 177* 112* 140* 99 139*       Signed:  Briant CedarNkeiruka J Ezenduka, MD [facility] 05/17/2018, 2:21 PM

## 2018-05-20 ENCOUNTER — Other Ambulatory Visit: Payer: Self-pay

## 2018-05-20 NOTE — Patient Outreach (Signed)
Transition of care: Discharged on 05/17/2018 with nausea and vomiting and orthostatic.  Placed call to patient who answered and reports she is doing well. Reports taking one nausea pill since she got home.  States no vomiting. Denies chest pain. Reports as long as she is not sick to her stomach that she is ok.   Reports she has all her medications and is taking them as prescribed.  Reports her family takes her to her appointments because she is legally blind.  Denies any concerns today.  PLAN: will update assigned case manager. Will need follow up transition of care call in 1 week. Reviewed 24 hour nurse line phone number and how to reach case manager.   Rowe PavyAmanda Leanord Thibeau, RN, BSN, CEN [facility] Care Coordinator [phone]

## 2018-05-24 ENCOUNTER — Telehealth: Payer: Self-pay | Admitting: Nurse Practitioner

## 2018-05-24 NOTE — Telephone Encounter (Signed)
   Pts dtr called this AM to report that pt c/o mild dyspnea earlier this AM. She has not yet gotten out of bed. She has been weighing herself daily and up to this point her wt has been stable since most recent hosp d/c (chart reviewed - lasix/spiro d/c'd in setting of prerenal azotemia).  Family was told that she might be able to take lasix on a prn basis but never told how or when to take it.  I rec that she may take lasix 40mg  PO x 1 for wt gain of > 2 lbs in 24 hrs.  If she has to take this multiple days/wk, she will need to inform our office as she will need a f/u bmet.  If her wt this AM is not elevated and she continues to have dyspnea, she will need to be eval in the ED.  Caller verbalized understanding and was grateful for the call back.  Nicolasa Duckinghristopher Rakwon Letourneau, NP 05/24/2018, 10:17 AM

## 2018-05-26 ENCOUNTER — Other Ambulatory Visit: Payer: PPO

## 2018-05-26 ENCOUNTER — Ambulatory Visit: Payer: PPO | Admitting: Interventional Cardiology

## 2018-05-27 ENCOUNTER — Other Ambulatory Visit: Payer: PPO

## 2018-05-29 ENCOUNTER — Ambulatory Visit: Payer: Self-pay | Admitting: *Deleted

## 2018-05-30 DIAGNOSIS — Z9861 Coronary angioplasty status: Secondary | ICD-10-CM | POA: Diagnosis not present

## 2018-05-30 DIAGNOSIS — G8929 Other chronic pain: Secondary | ICD-10-CM | POA: Diagnosis not present

## 2018-05-30 DIAGNOSIS — M545 Low back pain: Secondary | ICD-10-CM | POA: Diagnosis not present

## 2018-05-30 DIAGNOSIS — I2510 Atherosclerotic heart disease of native coronary artery without angina pectoris: Secondary | ICD-10-CM | POA: Diagnosis not present

## 2018-05-30 DIAGNOSIS — N183 Chronic kidney disease, stage 3 (moderate): Secondary | ICD-10-CM | POA: Diagnosis not present

## 2018-05-30 DIAGNOSIS — I5043 Acute on chronic combined systolic (congestive) and diastolic (congestive) heart failure: Secondary | ICD-10-CM | POA: Diagnosis not present

## 2018-05-30 DIAGNOSIS — I951 Orthostatic hypotension: Secondary | ICD-10-CM | POA: Diagnosis not present

## 2018-05-30 DIAGNOSIS — I480 Paroxysmal atrial fibrillation: Secondary | ICD-10-CM | POA: Diagnosis not present

## 2018-06-02 ENCOUNTER — Ambulatory Visit (INDEPENDENT_AMBULATORY_CARE_PROVIDER_SITE_OTHER): Payer: PPO | Admitting: Cardiology

## 2018-06-02 ENCOUNTER — Encounter: Payer: Self-pay | Admitting: Cardiology

## 2018-06-02 VITALS — BP 122/74 | HR 81 | Ht 64.0 in | Wt 206.0 lb

## 2018-06-02 DIAGNOSIS — I5022 Chronic systolic (congestive) heart failure: Secondary | ICD-10-CM

## 2018-06-02 DIAGNOSIS — E785 Hyperlipidemia, unspecified: Secondary | ICD-10-CM | POA: Diagnosis not present

## 2018-06-02 DIAGNOSIS — I2510 Atherosclerotic heart disease of native coronary artery without angina pectoris: Secondary | ICD-10-CM

## 2018-06-02 DIAGNOSIS — I480 Paroxysmal atrial fibrillation: Secondary | ICD-10-CM | POA: Diagnosis not present

## 2018-06-02 DIAGNOSIS — N289 Disorder of kidney and ureter, unspecified: Secondary | ICD-10-CM | POA: Diagnosis not present

## 2018-06-02 MED ORDER — ATORVASTATIN CALCIUM 40 MG PO TABS: 40.0000 mg | ORAL_TABLET | ORAL | 3 refills | Status: DC

## 2018-06-02 MED ORDER — ASPIRIN EC 81 MG PO TBEC: 81.0000 mg | DELAYED_RELEASE_TABLET | Freq: Every day | ORAL | 3 refills | Status: DC

## 2018-06-02 NOTE — Addendum Note (Signed)
Addended by: Tonita PhoenixBOWDEN, Candid Bovey K on: 06/02/2018 03:25 PM   Modules accepted: Orders

## 2018-06-02 NOTE — Progress Notes (Signed)
06/02/2018 [name]   1934-01-07  [id number]  Primary Physician Gordnier, Skeet Simmer, PA Primary Cardiologist: Dr. Eldridge Dace   Reason for Visit/CC: [facility] F/u for nausea/ vomiting and orthostatic hypotension, also CAD and systolic HF  HPI:  [name] is a 84 y/o female, followed by Dr. Eldridge Dace, with recent hospitalizations for MI, heart failure and atrial fibrillation. She has required multiple medication adjustments recently due to side effects and intolerances. She has had multiple hospitalizations in the last 2 months. To summarize her history, she was first admitted to the hospital 04/04/18 for anterior wall STEMI and found to have severe two-vessel disease and underwent PCI/DES to pLCx with plans for staged intervention to RCA (95% stenosis). Echo at that time revealed EF 40%. She was also noted to have a brief episodes of atrial fibrillation managed with amiodarone but anticoagulation was not initiated given quick transition to sinus rhythm. She was discharged home on 04/08/18, however represented back 04/12/18 with complaints of SOB and orthopnea felt to be 2/2 acute systolic CHF. She was diuresed with IV lasix, however repeat echo showed further drop in EF to 30-35% as well as early evidence of thrombus. She was then transitioned from brilinta to plavix and eliquis was started for LV thrombus. She was seen outpatient by Dr. Eldridge Dace 05/08/18 and primary complaints included nausea, decreased po intake, and back pain. She was without anginal complaints and given lack of cardiac symptoms, was not interested in revascularization at that time. Eliquis was discontinued at that time given nausea. She was recommended to continue low dose amiodarone in an effort to maintain NSR. Family expressed concerns about taking so many prescription medications at that time. Lab work at that visit revealed Cr 1.61 (baseline ~1.2) and she was recommended to stop entresto and start losartan 25mg  daily.    She presented back to the hospital in November with dizziness, nausea, and vomiting.She denied any chest pain or pressure.  No dyspnea.  Basically poor p.o. intake for 2 to 3 days.  Had only been taking Lasix and spironolactone during that timeframe. She was noted to be orthostatic and with renal insuffiencey. She was seen by Dr. Herbie Baltimore and he recommended admission. Her diuretics were held. Losartan also held. IVFs given. Amiodarone was felt to be the main cause of her symptoms. This was discontinued. She was monitored on tele. No recurrent afib. She was also given PRN antiemetics. Condition improved. It was advised that she continue to hold Lasix on discharge and to only take PRN if > 3 lb weight gain in 24 hrs. Other med changes were as follows. Losartan and spironolactone permanently discontinued. Her home dose of Toprol was reduced to 12.5 mg; She was also converted from Plavix to Brilinta (180 mg p.o. X 1 beginning 11/23 followed by 90 mg twice daily beginning p.m. 11/23 ).  She presents back to clinic today for f/u. She is here today with her husband and daughter. She reports significant improvement. She "feels like a new person". No more n/v. Denies dizziness. No CP, dyspnea, LEE, orthopnea, PND or palpitations. BP is stable at 122/74. EKG shows NSR.   Current Meds  Medication Sig  . acetaminophen (TYLENOL) 500 MG tablet Take 1,000 mg by mouth as needed.  . metoprolol succinate (TOPROL-XL) 25 MG 24 hr tablet Take 0.5 tablets (12.5 mg total) by mouth daily.  . nitroGLYCERIN (NITROSTAT) 0.4 MG SL tablet Place 1 tablet (0.4 mg total) under the tongue every 5 (five) minutes as needed for  chest pain.  Marland Kitchen. ondansetron (ZOFRAN-ODT) 4 MG disintegrating tablet Take 4 mg by mouth every 8 (eight) hours as needed for nausea or vomiting.   . ticagrelor (BRILINTA) 90 MG TABS tablet Take 1 tablet (90 mg total) by mouth 2 (two) times daily.  . [DISCONTINUED] acetaminophen (TYLENOL) 325 MG tablet Take 650 mg by  mouth every 6 (six) hours as needed (for back pain).   Allergies  Allergen Reactions  . Cymbalta [Duloxetine Hcl] Other (See Comments)    "Made me feel crazy"   Past Medical History:  Diagnosis Date  . Acute systolic heart failure (HCC)   . Arthritis   . CHF (congestive heart failure) (HCC)   . Coronary artery disease   . Hyperlipidemia   . PAF (paroxysmal atrial fibrillation) (HCC)   . STEMI (ST elevation myocardial infarction) (HCC)    04/04/18 PCI/DES to the pLAD, 95% dRCA (possible staged intervention), 75% mLAD, EF 45%   Family History  Problem Relation Age of Onset  . Hypertension Mother    Past Surgical History:  Procedure Laterality Date  . CHOLECYSTECTOMY    . CHOLECYSTECTOMY  08/09/2011   Procedure: LAPAROSCOPIC CHOLECYSTECTOMY;  Surgeon: Robyne AskewPaul S Toth III, MD;  Location: [facility];  Service: General;  Laterality: N/A;  . CORONARY/GRAFT ACUTE MI REVASCULARIZATION N/A 04/04/2018   Procedure: Coronary/Graft Acute MI Revascularization;  Surgeon: Corky CraftsVaranasi, Jayadeep S, MD;  [facility];  Service: Cardiovascular;  Laterality: N/A;  . HERNIA REPAIR    . LEFT HEART CATH AND CORONARY ANGIOGRAPHY N/A 04/04/2018   Procedure: LEFT HEART CATH AND CORONARY ANGIOGRAPHY;  Surgeon: Corky CraftsVaranasi, Jayadeep S, MD;  [facility];  Service: Cardiovascular;  Laterality: N/A;  . TUBAL LIGATION     Social History   Socioeconomic History  . Marital status: Married    Spouse name: Not on file  . Number of children: Not on file  . Years of education: Not on file  . Highest education level: Not on file  Occupational History  . Not on file  Social Needs  . Financial resource strain: Not on file  . Food insecurity:    Worry: Not on file    Inability: Not on file  . Transportation needs:    Medical: Not on file    Non-medical: Not on file  Tobacco Use  . Smoking status: Never Smoker  . Smokeless tobacco: Never Used  Substance and Sexual Activity  . Alcohol  use: No  . Drug use: No  . Sexual activity: Not on file  Lifestyle  . Physical activity:    Days per week: Not on file    Minutes per session: Not on file  . Stress: Not on file  Relationships  . Social connections:    Talks on phone: Not on file    Gets together: Not on file    Attends religious service: Not on file    Active member of club or organization: Not on file    Attends meetings of clubs or organizations: Not on file    Relationship status: Not on file  . Intimate partner violence:    Fear of current or ex partner: Not on file    Emotionally abused: Not on file    Physically abused: Not on file    Forced sexual activity: Not on file  Other Topics Concern  . Not on file  Social History Narrative  . Not on file     Review of Systems: General: negative  for chills, fever, night sweats or weight changes.  Cardiovascular: negative for chest pain, dyspnea on exertion, edema, orthopnea, palpitations, paroxysmal nocturnal dyspnea or shortness of breath Dermatological: negative for rash Respiratory: negative for cough or wheezing Urologic: negative for hematuria Abdominal: negative for nausea, vomiting, diarrhea, bright red blood per rectum, melena, or hematemesis Neurologic: negative for visual changes, syncope, or dizziness All other systems reviewed and are otherwise negative except as noted above.   Physical Exam:  Blood pressure 122/74, pulse 81, height 5\' 4"  (1.626 m), weight 206 lb (93.4 kg), SpO2 93 %.  General appearance: alert, cooperative and no distress Neck: no carotid bruit and no JVD Lungs: clear to auscultation bilaterally Heart: regular rate and rhythm, S1, S2 normal, no murmur, click, rub or gallop Extremities: extremities normal, atraumatic, no cyanosis or edema Pulses: 2+ and symmetric Skin: Skin color, texture, turgor normal. No rashes or lesions Neurologic: Grossly normal  EKG NSR 78 bpm -- personally reviewed   ASSESSMENT AND PLAN:   1.  CAD: s/p recent anterior wall STEMI 04/04/18 w/ cath showing severe 2V disease, treated with emergent PCI of 100% occluded pLCx, initially with plans for stage RCA PCI for 95% distal stenosis.  However, she is not interested in any revascularization at this time given her lack of cardiac symptoms. Continue to treat medically for now. Stable w/o CP. No dyspnea. Continue Brilinta and BB. Will add back statin and low dose ASA, 81 mg daily.   2. Systolic HF/ Ischemic CM: EF 30-35% s/p MI. Unfortunately, medical therapy is limited due to side effects and renal function. She did not tolerate Entresto nor Losartan or spironolactone, due to hypotension and renal insuffiencey. Continue BB.  Lasix was discontinued at recent hospitalization due to orthostatic hypotension. Plan to use lasix PRN only if > 3 lb weight gain in 24 hrs or > 5 lb in 1 week. She appears euvolemic today. No dyspnea. She will continue to check weights at home daily. We discussed low sodium diet. Will need repeat echo after 3 months of guidelines directed medical therapy for systolic HF. She has f/u with Dr. Eldridge Dace in Feb. He can arrange at that time.   3. Atrial Fibrillation: Brief episode noted during initial hospitalization in October around the time of her MI. Amiodarone discontinued due to nausea/ vomiting. Maintaining NSR based on EKG. She denies any breakthrough symptoms. Continue on metoprolol. Not on a/c due to side efftects.   4. ? LV Thrombus:  Was on Eliquis but discontinued by Dr. Eldridge Dace on 11/14 as it was initially felt to be the primary cause of her symptoms.  She continued to have symptoms despite discontinuation and was subsequently readmitted for nausea/ vomiting and orthostatic hypotension. Amiodarone was discontinued. Symptoms resolved. She is feeling much better. Will discuss with Dr. Abe People to see if we should continue to avoid Eliquis or resume. Of note, she has since been changed from Plavix to Brilinta.   5. HLD:  last lipid panel showed elevated LDL at 126 mg/dL. Goal is < 70. Her Lipitor was discontinued due to n/v but recs were to consider resuming as outpatient. I suspect her symptoms were primarily due to amiodarone. She is willing to try restarting statin. We will add back at a lower dose, 40 mg 3 days a week, at first, to see how she will tolerate. Repeat FLP and HFTs in Feb 2020.   Follow-Up: keep f/u with Dr. Eldridge Dace in Feb.   [facility] Delmer Islam, MHS [facility] 06/02/2018 3:08  PM

## 2018-06-02 NOTE — Patient Instructions (Signed)
Medication Instructions:  START ATORVASTATIN (LIPITOR) 40 mg 3 days per week START ASPIRIN 81 mg DAILY  If you need a refill on your cardiac medications before your next appointment, please call your pharmacy.   Lab work:TODAY BMP  ON 08/15/2018:  FASTING LIPID PANEL/HEPATIC FUNCTION TEST  If you have labs (blood work) drawn today and your tests are completely normal, you will receive your results only by: Marland Kitchen. MyChart Message (if you have MyChart) OR . A paper copy in the mail If you have any lab test that is abnormal or we need to change your treatment, we will call you to review the results.  Testing/Procedures: NONE  Follow-Up: Dr Eldridge DaceVaranasi 08/15/2018 @ 11:20   At [facility], you and your health needs are our priority.  As part of our continuing mission to provide you with exceptional heart care, we have created designated Provider Care Teams.  These Care Teams include your primary Cardiologist (physician) and Advanced Practice Providers (APPs -  Physician Assistants and Nurse Practitioners) who all work together to provide you with the care you need, when you need it. .   Any Other Special Instructions Will Be Listed Below (If Applicable).

## 2018-06-03 ENCOUNTER — Other Ambulatory Visit: Payer: Self-pay | Admitting: *Deleted

## 2018-06-03 ENCOUNTER — Telehealth: Payer: Self-pay

## 2018-06-03 LAB — BASIC METABOLIC PANEL
BUN/Creatinine Ratio: 21 (ref 12–28)
BUN: 23 mg/dL (ref 8–27)
CALCIUM: 9.1 mg/dL (ref 8.7–10.3)
CO2: 20 mmol/L (ref 20–29)
CREATININE: 1.09 mg/dL — AB (ref 0.57–1.00)
Chloride: 105 mmol/L (ref 96–106)
GFR, EST AFRICAN AMERICAN: 55 mL/min/{1.73_m2} — AB (ref >59)
GFR, EST NON AFRICAN AMERICAN: 47 mL/min/{1.73_m2} — AB (ref >59)
Glucose: 125 mg/dL — ABNORMAL HIGH (ref 65–99)
Potassium: 4.9 mmol/L (ref 3.5–5.2)
Sodium: 141 mmol/L (ref 134–144)

## 2018-06-03 NOTE — Patient Outreach (Signed)
[facility]) Care Management  06/03/2018  [name] 09-Dec-1933 [id number]  Transition of care  RN spoke with pt today and received update on her ongoing management of care. RN inquired on her recent hospitalization and review the current plan of care and updated accordingly based upon pt's progress. Pt states she fells "just wonderful". States no additional N/V and that she feels like a "new person". Pt verified she continue to have sufficient transportation and recent had change in her medications based upon a follow up with her provider. Pt doing well and able to verify she is in the GREEN zone with no swelling with her HF. RN reiterated on the HF zones to report any weight gained overnight of 3 lbs or 5 lbs within one week. RN continue to encouraged pt on her ongoing management of care and discussed all goals and adjusted interventions based upon pt's progress. Will continue to re-evaluate.  RN inquired once again on needed home visits to further engage along with weekly transition of care calls. Due to pt taking care of her spouse pt decline home visits and does not wish to receive weekly transition of care calls but remains receptive to the month assessments. RN will follow up early next month with ongoing program open for HF due to the resolution of her recent issues with N/V. No other inquires or request at this time. RN will also offer any needed community resources or needs that may require a social work with [facility] (pt declined however appreciative).   Plan to follow up next month with ongoing case management services related to the plan of care currently in place. Will continue to communicate with pt's provider on her progress.  [facility] CM Care Plan Problem One     Most Recent Value  Care Plan Problem One  Knowledge deficit related to HF  Role Documenting the Problem One  Care Management Coordinator  Care Plan for Problem One  Active  THN Long Term Goal   Pt will have an  increase knowledge base on managing HF over the next 90 days.  THN Long Term Goal Start Date  05/19/18  Interventions for Problem One Long Term Goal  Will extend this goal and continue to reiterate on HF symptoms and what to do if acute. Will verify pt remains in the GREEN zine with any acute symptoms encountered. Will verify weights and continue to encouraged adherence with managing this condition.   [facility]N CM Short Term Goal #1   Pt will complete daily weights and document all readings for providers to view over the next 30 days.  [facility]N CM Short Term Goal #1 Start Date  04/25/18  Interventions for Short Term Goal #1  Will continue to extend to allow ongoing adherence due to her recent admission. Will stress the importance of daily weights and how this affect possible encounters of symptoms.  THN CM Short Term Goal #2   Medication adherence over the next 30 days.  THN CM Short Term Goal #2 Start Date  04/25/18  [facility]N CM Short Term Goal #2 Met Date  06/03/18  [facility] CM Short Term Goal #3  Patient will report no readmissions to the hospital for nausea and vomiting in the next 30 days.   THN CM Short Term Goal #3 Start Date  05/19/18  Interventions for Short Tern Goal #3  Will extend this goal to allow ongoing adherence as pt continue to have medication changes based upon her progress for improving in  this area with no related symptoms. Will re-evaluate on next follow up call.    [facility] CM Care Plan Problem Two     Most Recent Value  Care Plan Problem Two  Transportantion issues  Role Documenting the Problem Two  Care Management Coordinator  Care Plan for Problem Two  Not Active  THN CM Short Term Goal #1   Pt will be receptive to consult with [facility] for transportation resources within the next 2 weeks  THN CM Short Term Goal #1 Start Date  04/25/18  [facility] CM Short Term Goal #1 Met Date   05/19/18    Raina Mina, RN Care Management Coordinator [place] Office [phone]

## 2018-06-03 NOTE — Telephone Encounter (Signed)
-----   Message from Allayne ButcherBrittainy M Simmons, [place]PA-C sent at 06/03/2018  8:06 AM EST ----- kidney function looks good, overall improved compared to prior labs. Electrolytes are normal.

## 2018-06-03 NOTE — Telephone Encounter (Signed)
Notes recorded by Sigurd Sosapp, Arjan Strohm, RN on 06/03/2018 at 10:22 AM EST The patient has been notified of the result and verbalized understanding. All questions (if any) were answered. Sigurd SosMichael Dustee Bottenfield, RN 06/03/2018 10:22 AM

## 2018-06-04 ENCOUNTER — Telehealth: Payer: Self-pay

## 2018-06-04 MED ORDER — CLOPIDOGREL BISULFATE 75 MG PO TABS: 75.0000 mg | ORAL_TABLET | Freq: Every day | ORAL | 3 refills | Status: DC

## 2018-06-04 MED ORDER — APIXABAN 5 MG PO TABS: 5.0000 mg | ORAL_TABLET | Freq: Two times a day (BID) | ORAL | 3 refills | Status: DC

## 2018-06-04 NOTE — Telephone Encounter (Signed)
Spoke to Bailey[place] Cross[place] (daughter) and we discussed patient's medication changes.  We stopped Brilinta, because of expense and added Plavix 75 mg daily and Eliquis 5 mg twice per day.

## 2018-06-06 ENCOUNTER — Telehealth: Payer: Self-pay

## 2018-06-06 DIAGNOSIS — M545 Low back pain, unspecified: Secondary | ICD-10-CM | POA: Insufficient documentation

## 2018-06-06 DIAGNOSIS — G8929 Other chronic pain: Secondary | ICD-10-CM | POA: Insufficient documentation

## 2018-06-06 NOTE — Telephone Encounter (Signed)
Attempted to contact patient to see if the patient is wanting to switch from Eliquis to coumadin. Discussed with RN in CVRR. Also wanted to verify if the patient had enough supply of Eliquis to last until PharmD back in the office next week. Left message for patient to call back. Will forward to CVRR to keep in the loop.

## 2018-06-06 NOTE — Telephone Encounter (Signed)
Discussed with Dr. Eldridge DaceVaranasi, and patient will stop ASA when she restarts the eliquis.

## 2018-06-06 NOTE — Telephone Encounter (Signed)
Discussed with daughter and states that the patient is completely opposed to taking coumadin. She wants to know if the cost for Eliquis can be reduced or if there is another cheaper option other than coumadin that she can take.   Also states that she is switching from Brilinta to Plavix next week. She restarted ASA at OV on 12/9. She has not restarted the eliquis yet. Daughter made aware that we will leave samples of eliquis along with application for patient assistance up front for her to pick up. Daughter states that she will restart the eliquis a week after starting the plavix as they only want to start one medication at a time to see if any of her prior Sx return. Will discuss with Dr. Eldridge DaceVaranasi on whether or not she will need to stop her ASA when she restarts her eliquis. Made daughter aware that I would let her know early next week. She verbalized understanding.

## 2018-06-06 NOTE — Telephone Encounter (Signed)
----- Message from Allayne Butcher, PA-C sent at 06/05/2018 12:53 PM EST ----- Can we check to see if patient qualifies for patient assistance to help with Eliquis?  [place], Can we also see about getting a consult with pharmacy to discuss switching to coumadin?  Thanks.     There is coumadin, also known as warfarin. It is a much cheaper alternative to Eliquis but requires frequent monitoring. This involves coming into the office regularly to meet with a pharmacist to have blood levels checked and dose adjustments made. This is typically done weekly, in the beginning, but once we are able to find a right dose and get you stabilized, theses appointments become less frequent (usually every 4-6 weeks, maybe more frequent if any issues). There are also more food and drug interactions with coumadin, which is also a reason for frequent monitoring.   Eliquis is much more convenient but more expensive. I will seen a message to one of our staff members to see if there is any way to get help with lower cost. I will also send to Dr. Hoyle Barr nurse to see about getting you in to meet with one of our pharmacist to discuss changing over to coumadin and then you can decide if you would like to change.    ===View-only below this line===   ----- Message -----    From: Fermin Schwab Loya    Sent: 06/04/2018 12:43 PM EST      To: Robbie Lis, PA-C Subject: RE: Visit Follow-Up Question  Matthew Saras I spoke with Casimiro Needle this morning he is just amazing. He called in the Eliquis for me however we're still looking at $90 for a 90 day supply I would really be interested in something different if it would fit in that $10 co-pay for a 90 day supply can you see what we can do about that thank you so much.  ----- Message ----- From: Robbie Lis, PA-C Sent: 06/03/18, 4:38 PM To: Fermin Schwab [place] Subject: RE: Visit Follow-Up Question  Hi,  We can switch back to Plavix, which is cheaper. Also after  you left, I discussed with Dr. Eldridge Dace about your case. I gave him an update. Given your nausea resolved (thinking it was the amiodarone), I asked him to weigh in on restarting the Eliquis to reduce risk of stroke. This was the reason we had you on it in the first place. He recommended going back to Plavix and Eliquis. I will have my RN Casimiro Needle follow up with you in the morning to discuss.   In the meantime, we will send a new prescription for Plavix to your pharmacy. Start taking Plavix 75 mg once daily. Continue taking Brilinta until you pick up prescription. Once you start Plavix, stop Brilinta.   Eliquis dose is 5 mg twice daily.   Hope you are still feeling well!    ----- Message -----    From: [name]    Sent: 06/03/2018  4:07 PM EST      To: Robbie Lis, PA-C Subject: Visit Follow-Up Question  [place] I have about another weeks worth of the Brilinta from a hospital visit I went to fill the prescription today and that's going to cost [place] $90 a month which I just don't think is something we're ready to pay for when we know that there's other medications that will do the same thing out there will you please pick something that is on our five dollar co-pay and send it in for  us or if we need to discuss it please call me at [phone]

## 2018-06-09 ENCOUNTER — Ambulatory Visit: Payer: Self-pay | Admitting: *Deleted

## 2018-06-09 NOTE — Telephone Encounter (Signed)
Called and made daughter aware that Dr. Eldridge DaceVaranasi said that the patient should stop taking the ASA when she restarts the Eliquis. Daughter verbalized understanding and thanked me for the call.

## 2018-06-30 ENCOUNTER — Telehealth: Payer: Self-pay

## 2018-06-30 NOTE — Telephone Encounter (Signed)
Returned call to patient's daughter, [name] (DPR on file).  Kelly [name] frustration about completing the pt assistance documentation and is then told the patient would not qualify for patient assistance.  Writer explained to [name] it is always worth a "try" to access any possible patient assistance.  Discussed the "donut hole." Writer read donut hole guidelines to [name] while on the phone.  Kelly [name] concern about about reaching the donut hole quickly and not having other options.  Writer offered to check to see if we had samples available, however explained this was a short term solution only. [name] stated she would rather pay the $90 instead of driving to our office for samples. [name] is adamant Coumadin is not an option they want to pursue.  Writer encouraged [name] to contact [facility] to see if there was a case Designer, television/film set who could provide patient assistance resources.  We also discussed GoodRx discount coupons (do not lower copays, but could help slow progress to the donut hole).  [name] is going to contact [facility] for suggestions for possible assistance.  [name] and [name] are aware of the potential consequences associated with patient not taking Anticoagulation therapy as prescribed.  This was reviewed with [name] again today, she verbalized understanding.

## 2018-06-30 NOTE — Telephone Encounter (Signed)
**Note De-Identified  Obfuscation** Due to [facility] message sent today from the pt I contacted her daughter to let her know that since the pt has medication insurance coverage (Part D) and because she is not in the donut hole she is not eligible to receive pt asst for her Eliquis.  The pts daughter was very rude and complained throughout our conversation arguing that the staff in this office has put her through a lot of work for nothing. I advised her that the application she was given had all the instructions, qualifications for applying and the phone number to call the company if they had any questions.   She continued to be rude talking over me so I advised her that if she continued I would hang up and end the conversation. She then asked for my name and I gave it to her. She then asked that I have my manager call her and I told her that I would.  I have discussed with Jim Like, clinical manager who will either call her back or Dr Hoyle Barr nurse will call her back.

## 2018-07-04 ENCOUNTER — Other Ambulatory Visit: Payer: Self-pay | Admitting: *Deleted

## 2018-07-04 NOTE — Patient Outreach (Signed)
[place] [facility]) Care Management  07/04/2018  [name] July 06, 1933 [id number]    Spoke with pt today and received an update on her ongoing management of care. Pt reports her recent weights 200 lbs with no fluctuation from her baseline weight range. Denies any swelling, no issues with her breathing and pt adherent to attending all medical appointments. States her daughter continues to assist with her medications (pillbox refills). Therefore pt does not feel she should be as knowledgeable about all her medications. RN offered to review however pt declined but inquired about her Eliquis indicating it was "to expensive". RN offered a referral as pt agreed. Pt also denies any reoccurrence of N/V and RN confirmed pt is in the GREEN zone with her HF.   RN reiterated on the signs and symptome due to her response with recalling the symptoms. Will review the HF zones and discussed her plan of care along with interventions that will be adjusted based upon pt's progress. Will continue to offer any needed community assistance as pt continues to manage her ongoing care. Will follow up next month on pt's progress with possible discussion for transitioning over to a health coach as pt continue to manage her care independently.  [facility] CM Care Plan Problem One     Most Recent Value  Care Plan Problem One  Knowledge deficit related to HF  Role Documenting the Problem One  Care Management Coordinator  Care Plan for Problem One  Active  [facility]N Long Term Goal   Pt will have an increase knowledge base on managing HF over the next 90 days.  [facility]N Long Term Goal Start Date  05/19/18  Interventions for Problem One Long Term Goal  Will verify pt continue to engage in her slef management of care with daily HF monitoring. Will verify pt's increase knowledge base as ongoing review of HF zones is needed at this time. Will follow up next month and re-evaluate pt's knowledge on managing this condition. Will encourage  pt to review the HF zones able to recognize precipitating symptoms and what to do if acute  THN CM Short Term Goal #1   Pt will complete daily weights and document all readings for providers to view over the next 30 days.  [facility]N CM Short Term Goal #1 Start Date  04/25/18  [facility] CM Short Term Goal #1 Met Date  07/04/18  [facility] CM Short Term Goal #3  Patient will report no readmissions to the hospital for nausea and vomiting in the next 30 days.   [facility]N CM Short Term Goal #3 Start Date  05/19/18  [facility] CM Short Term Goal #3 Met Date  07/04/18    Raina Mina, RN Care Management Coordinator [place] Office [phone]

## 2018-07-07 ENCOUNTER — Telehealth: Payer: Self-pay | Admitting: Pharmacist

## 2018-07-07 NOTE — Patient Outreach (Signed)
[place] [facility]) Care Management  [place]   07/07/2018  Ziah Turvey Emanuelson 07/22/1933 [id number]  Reason for referral: medication assistance Referral source: Telephonic Nurse Referral medication(s): Eliqus Current insurance:HTA  HPI:  Patient is an 85 year old female with multiple medical conditions including but not limited to:  CAD, CHF, Obesity, hyperlipidemia, A Fib, and orthostatic hypotension.  Patient was called. Her daughter Ermalinda Barrios) answered the phone and identified HIPAA. Ermalinda Barrios is on the patient's HIPAA form. Ivey expressed interest in getting help with Eliquis because it put her mother in the donut hole last year and was very expensive.   Objective: Allergies  Allergen Reactions  . Amiodarone Nausea And Vomiting  . Cymbalta [Duloxetine Hcl] Other (See Comments)    "Made me feel crazy"    Medications Reviewed Today    Reviewed by Elayne Guerin, [facility] (Pharmacist) on 07/07/18 at 1057  Med List Status: <None>  Medication Order Taking? Sig Documenting Provider Last Dose Status Informant  acetaminophen (TYLENOL) 500 MG tablet [id number] Yes Take 1,000 mg by mouth as needed. [provider] Taking Active   apixaban (ELIQUIS) 5 MG TABS tablet [id number] Yes Take 1 tablet (5 mg total) by mouth 2 (two) times daily. Lyda Jester M, PA-C Taking Active   atorvastatin (LIPITOR) 40 MG tablet [id number] Yes Take 1 tablet (40 mg total) by mouth 3 (three) times a week. Lyda Jester M, PA-C Taking Active   clopidogrel (PLAVIX) 75 MG tablet [id number] Yes Take 1 tablet (75 mg total) by mouth daily. Lyda Jester M, PA-C Taking Active   metoprolol succinate (TOPROL-XL) 25 MG 24 hr tablet [id number]  Take 0.5 tablets (12.5 mg total) by mouth daily. Alma Friendly, MD  Expired 06/17/18 2359   nitroGLYCERIN (NITROSTAT) 0.4 MG SL tablet [id number] Yes Place 1 tablet (0.4 mg total) under the tongue every 5 (five) minutes as needed for chest pain.  Jettie Booze, MD Taking Active Family Member           Med Note [place], Deretha Emory May 14, 2018  7:04 PM) Patient has this, but hasn't ever had to use any  ondansetron (ZOFRAN-ODT) 4 MG disintegrating tablet [id number] Yes Take 4 mg by mouth every 8 (eight) hours as needed for nausea or vomiting.  [provider] Taking Active Family Member  promethazine (PHENERGAN) 12.5 MG tablet [id number]  Take 1 tablet (12.5 mg total) by mouth every 6 (six) hours as needed for up to 7 days for nausea or vomiting. Alma Friendly, MD  Expired 05/24/18 2359            Med Note Corinna Lines Jul 07, 2018 10:55 AM) Has not needed it!  traMADol (ULTRAM) 50 MG tablet [id number] Yes Take 50 mg by mouth every 6 (six) hours as needed. [provider] Taking Active            Med Note Corinna Lines Jul 07, 2018 10:57 AM) Patient only takes 1-2 times per day          Assessment:  Drugs sorted by system:  Cardiovascular: Atorvastatin, Eliquis, Clopidogrel, Nitroglycerin, Metoprolol  Gastrointestinal: Promethazine, ondansetron  Pain: Acetaminophen, Tramadol  Medication Review Findings:  . Patient said she only takes ondansetron or promethazine if necessary   Medication Assistance Findings:  Medication assistance needs identified.   [place] Patient Assistance Program  -Patient has not met the 3% OOP  Requirement.   -Patient is over income for the  LIS/Extra Help program   -Patient's daughter said she had a  paritially completed [employer] (A new app will  be sent along with a return  address.)     Additional medication assistance options reviewed with patient as warranted:  No other options identified  Plan: I will route patient assistance letter to [place] technician who will coordinate patient assistance program application process for medications listed above.  [facility] pharmacy technician  will assist with obtaining all required documents from both patient and provider(s) and submit application(s) once completed.

## 2018-07-08 ENCOUNTER — Telehealth: Payer: Self-pay

## 2018-07-08 ENCOUNTER — Other Ambulatory Visit: Payer: Self-pay | Admitting: Pharmacy Technician

## 2018-07-08 NOTE — Telephone Encounter (Signed)
**Note De-Identified Eli Adami Obfuscation** We received the provider part of a BMS pt asst application for the pts [name] [name] fax from [facility], CPhT with [facility]. I completed the form, Boyce Medici, PA-c signed the form and I have faxed it back to [place] at [facility].

## 2018-07-08 NOTE — Patient Outreach (Signed)
[place] [facility]) Care Management  07/08/2018  [name] 1933/09/27 [id number]    Received patient assistance referral from [place] for BMS for Eliquis.  Prepared patient portion to be mailed. Faxed provider portion to [place], [place].  Patient to followup with [facility] RPh Denyse Amass or myself once she has met the out of pocket requirement for the program.  Sharee Pimple P. Diantha Paxson, [place] Management [phone]

## 2018-07-22 ENCOUNTER — Other Ambulatory Visit: Payer: Self-pay | Admitting: Pharmacist

## 2018-07-22 ENCOUNTER — Ambulatory Visit: Payer: Self-pay | Admitting: Pharmacist

## 2018-07-22 NOTE — Patient Outreach (Signed)
[facility]) Care Management  07/22/2018  Kirsi Kalata [place] 1933-09-01 [id number]   Spoke with patient. HIPAA identifiers were obtained. She confirmed receiving the Eliquis application that was sent to her. In addition, she confirmed understanding of needing to spend 3% of her income in medication expense prior to being eligible for the program.  Plan: Close patient's case for now as she has not spent enough to qualify but has everything she needs until she does.  Her case will be reopened when she meets the 3% out-of-pocket requirement.   Beecher Mcardle, PharmD, BCACP [facility] Clinical Pharmacist [phone]

## 2018-08-08 ENCOUNTER — Other Ambulatory Visit: Payer: Self-pay | Admitting: *Deleted

## 2018-08-08 NOTE — Patient Outreach (Signed)
[facility]) Care Management  08/08/2018  Gere Winward [place] October 22, 1933 [id number]    RN spoke with pt today and received an update from pt. States she is doing well with no acute events or issues. Reports she remains in the GREEN zone with no residual swelling and not issues with her breathing. Pt still unable to recite signs however denies any when mentioned. Discussed pt transitioning over to a Health Coach for ongoing disease management services in managing her HF (pt very receptive). Pt appreciative and grateful for [facility] services. No other needs or inquires at this time. All goals discussed with intervention adjusted based upon pt's progress. Will alert pt's provider of pt's disposition with [facility] services.   [facility] CM Care Plan Problem One     Most Recent Value  Care Plan Problem One  Knowledge deficit related to HF  Role Documenting the Problem One  Care Management Coordinator  Care Plan for Problem One  Active  [facility]N Long Term Goal   Pt will have an increase knowledge base on managing HF over the next 90 days.  [facility] Long Term Goal Start Date  05/19/18  Interventions for Problem One Long Term Goal  WIll reiterated on HF zones as pt able to verify no symptoms however continue to need reinforcement with mentoining such symtpoms. Will also verify pt remains in the GREEN zine with no acute events or episodes. Will transition over to a [facility] for ongoing disease management services.       Elliot Cousin, RN Care Management Coordinator [facility] Main Office [phone]

## 2018-08-12 ENCOUNTER — Other Ambulatory Visit: Payer: Self-pay | Admitting: *Deleted

## 2018-08-15 ENCOUNTER — Encounter: Payer: Self-pay | Admitting: Interventional Cardiology

## 2018-08-15 ENCOUNTER — Other Ambulatory Visit: Payer: PPO

## 2018-08-15 ENCOUNTER — Ambulatory Visit: Payer: PPO | Admitting: Interventional Cardiology

## 2018-08-15 VITALS — BP 128/74 | HR 81 | Ht 64.0 in | Wt 208.8 lb

## 2018-08-15 DIAGNOSIS — I480 Paroxysmal atrial fibrillation: Secondary | ICD-10-CM | POA: Diagnosis not present

## 2018-08-15 DIAGNOSIS — I252 Old myocardial infarction: Secondary | ICD-10-CM | POA: Diagnosis not present

## 2018-08-15 DIAGNOSIS — I2510 Atherosclerotic heart disease of native coronary artery without angina pectoris: Secondary | ICD-10-CM

## 2018-08-15 DIAGNOSIS — I5022 Chronic systolic (congestive) heart failure: Secondary | ICD-10-CM | POA: Diagnosis not present

## 2018-08-15 DIAGNOSIS — E785 Hyperlipidemia, unspecified: Secondary | ICD-10-CM | POA: Diagnosis not present

## 2018-08-15 NOTE — Patient Instructions (Signed)
Medication Instructions:  Your physician recommends that you continue on your current medications as directed. Please refer to the Current Medication list given to you today.  If you need a refill on your cardiac medications before your next appointment, please call your pharmacy.   Lab work: Please have fasting lipid panel and liver function test drawn at your PCP.   If you have labs (blood work) drawn today and your tests are completely normal, you will receive your results only by: Marland Kitchen MyChart Message (if you have MyChart) OR . A paper copy in the mail If you have any lab test that is abnormal or we need to change your treatment, we will call you to review the results.  Testing/Procedures: None ordered  Follow-Up: At [facility], you and your health needs are our priority.  As part of our continuing mission to provide you with exceptional heart care, we have created designated Provider Care Teams.  These Care Teams include your primary Cardiologist (physician) and Advanced Practice Providers (APPs -  Physician Assistants and Nurse Practitioners) who all work together to provide you with the care you need, when you need it. . You will need a follow up appointment in 6 months.  Please call our office 2 months in advance to schedule this appointment.  You may see Everette Rank, MD or one of the following Advanced Practice Providers on your designated Care Team:   . Robbie Lis, PA-C . Dayna Dunn, PA-C . Jacolyn Reedy, PA-C  Any Other Special Instructions Will Be Listed Below (If Applicable).

## 2018-08-15 NOTE — Progress Notes (Signed)
Cardiology Office Note   Date:  08/15/2018   ID:  [name], DOB [birth year], MRN [id number]  PCP:  Daylene Katayama, PA    No chief complaint on file.  CAD/Old MI  Wt Readings from Last 3 Encounters:  08/15/18 208 lb 12.8 oz (94.7 kg)  06/02/18 206 lb (93.4 kg)  05/17/18 202 lb (91.6 kg)       History of Present Illness: [name] is a 85 y.o. female  was first admitted to the hospital 04/04/18 for anterior wall STEMI and found to have severe two-vessel disease and underwent PCI/DES to pLCx with plans for staged intervention to RCA (95% stenosis). Echo at that time revealed EF 40%. She was also noted to have a brief episodes of atrial fibrillation managed with amiodarone but anticoagulation was not initiated given quick transition to sinus rhythm. She was discharged home on 04/08/18, however represented back 04/12/18 with complaints of SOB and orthopnea felt to be 2/2 acute systolic CHF. She was diuresed with IV lasix, however repeat echo showed further drop in EF to 30-35% as well as early evidence of thrombus. She was then transitioned from brilinta to plavix and eliquis was started for LV thrombus. She was seen outpatient by Dr. Eldridge Dace 05/08/18 and primary complaints included nausea, decreased po intake, and back pain. She was without anginal complaints and given lack of cardiac symptoms, was not interested in revascularization at that time. Eliquis was discontinued at that time given nausea. She was recommended to continue low dose amiodarone in an effort to maintain NSR. Family expressed concerns about taking so many prescription medications at that time. Lab work at that visit revealed Cr 1.61 (baseline ~1.2) and she was recommended to stop entresto and start losartan 25mg  daily.   She presented back to the hospital in November with dizziness, nausea, and vomiting.She denied any chest pain or pressure. No dyspnea. Basically poor p.o. intake for 2 to 3 days. Had only  been taking Lasix and spironolactone during that timeframe. She was noted to be orthostatic and with renal insuffiencey. She was seen by Dr. Herbie Baltimore and he recommended admission. Her diuretics were held. Losartan also held. IVFs given. Amiodarone was felt to be the main cause of her symptoms. This was discontinued. She was monitored on tele. No recurrent afib. She was also given PRN antiemetics. Condition improved. It was advised that she continue to hold Lasix on discharge and to only take PRN if > 3 lb weight gain in 24 hrs. Other med changes were as follows. Losartan and spironolactone permanently discontinued. Her home dose of Toprol was reduced to 12.5 mg;She was also converted from Plavix to Brilinta (180 mg p.o. X 1beginning11/23 followed by 90 mg twice daily beginning p.m. 11/23 ).  She had multiple medication adjustments.  Stopping amiodarone seem to be the biggest cause of her improvement.  She was feeling well at her appointment in December 2019.  Of late, she has been feeling well.  Denies : Chest pain. Dizziness. Leg edema. Nitroglycerin use. Orthopnea. Palpitations. Paroxysmal nocturnal dyspnea. Shortness of breath. Syncope.   She did not participate in cardiac rehab.  The husband is also asking about new medications that can clear out plaque inside of the arteries.     Past Medical History:  Diagnosis Date  . Acute systolic heart failure (HCC)   . Arthritis   . CHF (congestive heart failure) (HCC)   . Coronary artery disease   . Hyperlipidemia   . PAF (  paroxysmal atrial fibrillation) (HCC)   . STEMI (ST elevation myocardial infarction) (HCC)    04/04/18 PCI/DES to the pLAD, 95% dRCA (possible staged intervention), 75% mLAD, EF 45%    Past Surgical History:  Procedure Laterality Date  . CHOLECYSTECTOMY    . CHOLECYSTECTOMY  08/09/2011   Procedure: LAPAROSCOPIC CHOLECYSTECTOMY;  Surgeon: Robyne Askew, MD;  Location: [facility] ORS;  Service: General;  Laterality: N/A;  .  CORONARY/GRAFT ACUTE MI REVASCULARIZATION N/A 04/04/2018   Procedure: Coronary/Graft Acute MI Revascularization;  Surgeon: Corky Crafts, MD;  Location: [facility] INVASIVE CV LAB;  Service: Cardiovascular;  Laterality: N/A;  . HERNIA REPAIR    . LEFT HEART CATH AND CORONARY ANGIOGRAPHY N/A 04/04/2018   Procedure: LEFT HEART CATH AND CORONARY ANGIOGRAPHY;  Surgeon: Corky Crafts, MD;  Location: [facility] INVASIVE CV LAB;  Service: Cardiovascular;  Laterality: N/A;  . TUBAL LIGATION       Current Outpatient Medications  Medication Sig Dispense Refill  . acetaminophen (TYLENOL) 500 MG tablet Take 1,000 mg by mouth as needed.    Marland Kitchen apixaban (ELIQUIS) 5 MG TABS tablet Take 1 tablet (5 mg total) by mouth 2 (two) times daily. 180 tablet 3  . atorvastatin (LIPITOR) 40 MG tablet Take 1 tablet (40 mg total) by mouth 3 (three) times a week. 90 tablet 3  . clopidogrel (PLAVIX) 75 MG tablet Take 1 tablet (75 mg total) by mouth daily. 90 tablet 3  . metoprolol succinate (TOPROL-XL) 25 MG 24 hr tablet Take 0.5 tablets (12.5 mg total) by mouth daily. 15 tablet 0  . nitroGLYCERIN (NITROSTAT) 0.4 MG SL tablet Place 1 tablet (0.4 mg total) under the tongue every 5 (five) minutes as needed for chest pain. 25 tablet 2  . ondansetron (ZOFRAN-ODT) 4 MG disintegrating tablet Take 4 mg by mouth every 8 (eight) hours as needed for nausea or vomiting.   0  . promethazine (PHENERGAN) 12.5 MG tablet Take 1 tablet (12.5 mg total) by mouth every 6 (six) hours as needed for up to 7 days for nausea or vomiting. 28 tablet 0  . traMADol (ULTRAM) 50 MG tablet Take 50 mg by mouth every 6 (six) hours as needed.     No current facility-administered medications for this visit.     Allergies:   Amiodarone and Cymbalta [duloxetine hcl]    Social History:  The patient  reports that she has never smoked. She has never used smokeless tobacco. She reports that she does not drink alcohol or use drugs.   Family History:  The patient's  family history includes Hypertension in her mother.    ROS:  Please see the history of present illness.   Otherwise, review of systems are positive for stable weight.   All other systems are reviewed and negative.    PHYSICAL EXAM: VS:  BP 128/74   Pulse 81   Ht 5\' 4"  (1.626 m)   Wt 208 lb 12.8 oz (94.7 kg)   SpO2 93%   BMI 35.84 kg/m  , BMI Body mass index is 35.84 kg/m. GEN: Well nourished, well developed, in no acute distress  HEENT: normal  Neck: no JVD, carotid bruits, or masses Cardiac: RRR; no murmurs, rubs, or gallops,no edema  Respiratory:  clear to auscultation bilaterally, normal work of breathing GI: soft, nontender, nondistended, + BS MS: no deformity or atrophy  Skin: warm and dry, no rash Neuro:  Strength and sensation are intact Psych: euthymic mood, full affect    Recent Labs: 04/15/2018:  Magnesium 1.9 05/08/2018: TSH 1.470 05/14/2018: ALT 23; B Natriuretic Peptide 337.1; Hemoglobin 12.6; Platelets 178 06/02/2018: BUN 23; Creatinine, Ser 1.09; Potassium 4.9; Sodium 141   Lipid Panel    Component Value Date/Time   CHOL 230 (H) 04/04/2018 1248   TRIG 271 (H) 04/04/2018 1248   HDL 50 04/04/2018 1248   CHOLHDL 4.6 04/04/2018 1248   VLDL 54 (H) 04/04/2018 1248   LDLCALC 126 (H) 04/04/2018 1248     Other studies Reviewed: Additional studies/ records that were reviewed today with results demonstrating: Labs reviewed.   ASSESSMENT AND PLAN:  1. Coronary artery disease/old MI: No angina on medical therapy.  She does have significant right coronary artery disease.  Will refer her to the research foundation regarding the trial of IV lipid-lowering medication post MI.  I am not sure if she would be a candidate at this point.  Initially, revascularization was planned but now, we are planning on any medical therapy.  I recommended cardiac rehab.  She is not interested.  The daughter states that if the patient does not do regular activity on her own, they will call  us back and try to get her into cardiac rehab. 2. Hyperlipidemia: She needs her lipids and liver function test checked.  She will do this at her primary care doctor when she is fasting. 3. Chronic systolic heart failure: She appears euvolemic.  Continue current medications.  She has been very sensitive to various medicines.  Would not try to add any further medicines.  She was tried on Entresto and then losartan in the past. 4. PAF: In NSR.  On Eliquis for stroke prevention.    Current medicines are reviewed at length with the patient today.  The patient concerns regarding her medicines were addressed.  The following changes have been made:  No change  Labs/ tests ordered today include: Lipids No orders of the defined types were placed in this encounter.   Recommend 150 minutes/week of aerobic exercise Low fat, low carb, high fiber diet recommended  Disposition:   FU in 6 months   Signed, Lance MussJayadeep Leily Capek, MD  08/15/2018 11:31 AM    [facility] [address], [place], [place]  [address] Phone: [phone]; Fax: [phone]

## 2018-08-22 DIAGNOSIS — E785 Hyperlipidemia, unspecified: Secondary | ICD-10-CM | POA: Diagnosis not present

## 2018-08-22 DIAGNOSIS — I2589 Other forms of chronic ischemic heart disease: Secondary | ICD-10-CM | POA: Diagnosis not present

## 2018-09-04 ENCOUNTER — Other Ambulatory Visit: Payer: Self-pay | Admitting: *Deleted

## 2018-09-04 NOTE — Patient Outreach (Signed)
[facility]) Care Management  09/04/2018  [name] 02-05-1934 [id number]   RN Health Coach Initial Assessment  Referral Date:  08/08/2018 Referral Source:  Transfer from [facility] Nursing Reason for Referral:  Continued Disease Management Education Insurance:  [employer]   Outreach Attempt:  Outreach attempt #1 to patient for introductory call and initial telephone assessment. Female answered and stated she was on another telephone call and requested a telephone call back.  Plan:  RN Health Coach will make another outreach attempt within the month of March.   [name] RN [facility] Care Management  RN Health Coach [phone] [name]

## 2018-09-06 DIAGNOSIS — H8110 Benign paroxysmal vertigo, unspecified ear: Secondary | ICD-10-CM | POA: Diagnosis not present

## 2018-09-19 ENCOUNTER — Other Ambulatory Visit: Payer: Self-pay | Admitting: *Deleted

## 2018-09-19 NOTE — Patient Outreach (Signed)
[facility]) Care Management  09/19/2018  [name] 11-Jul-1933 [id number]   RN Health Coach Initial Assessment  Referral Date:  08/08/2018 Referral Source:  Transfer from [facility] Nursing Reason for Referral:  Continued Disease Management Education Insurance:  [employer]   Outreach Attempt:  Outreach attempt #2 to patient for introduction and initial telephone assessment. No answer. RN Health Coach left HIPAA compliant voicemail message along with contact information.  Plan:  RN Health Coach will make another outreach attempt within the month of April.  RN Health Coach will send Unsuccessful Outreach Letter.  [name] RN [facility] Care Management  RN Health Coach [phone] [email or web address]

## 2018-10-13 ENCOUNTER — Other Ambulatory Visit: Payer: Self-pay | Admitting: *Deleted

## 2018-10-13 ENCOUNTER — Encounter: Payer: Self-pay | Admitting: *Deleted

## 2018-10-13 NOTE — Patient Outreach (Signed)
[facility] [facility]) Care Management  [place]  10/13/2018   [name] 02-04-1934 [id number]    RN Health Coach Initial Assessment   Referral Date:  08/08/2018 Referral Source:  Transfer from [place] Reason for Referral:  Continued Disease Management Education Insurance:  Health Team Advantage   Outreach Attempt:  Successful telephone outreach to patient for introduction and initial telephone assessment.  HIPAA verified with patient.  RN Health Coach introduced self and role.  Patient verbally agrees to Disease Management Outreaches.  Patient completed initial telephone assessment.  Social:  Patient lives at home with husband.  Reports being independent with ADLs and husband assisting with IADLs.  Ambulates with cane or walker (mostly holding to furniture in the home) and reporting about 3 falls in the last year.  The last fall in February at a store hitting her knee and head, states she did not seek medical attention at the time.  Fall precautions and preventions reviewed and discussed.  Discussed importance of medical follow up after fall, especially hitting her head on blood thinners.  Patient reporting her daughter and husband transport her to her medical appointments.  DME in the home include: bedside commode, blood pressure cuff, straight cane, eyeglasses, scale, rolling walker, chair lift on stairs.  Conditions:  Per chart review and discussion with patient, PMH include but not limited to:  Coronary artery disease with stenting, myocardial infarction, congestive heart failure, atrial fibrillation, LV thrombus, hyperlipidemia, macular degeneration (legally blind), and cholecystectomy.   Reports Urgent Care visit on 09/06/2018 for dizziness and diagnosed with Vertigo (states her fall with hitting of her head was worked up at this time and she was told no abnormalities post fall).  States she weighs herself about every other day.  Discussed with patient  importance of daily weight monitoring and when to weigh.  Patient able to verbalize why she should weigh daily and what to do with increase in weight, but states she prefers to continue to weigh every other day and she will stick to that routine.  Last weight was 205 pounds (which is her baseline per patient).  Does report chronic back pain relieved with tylenol and resting in chair or bed.  Denies any current shortness of breath or lower extremity edema.    Medications:  Patient reports taking about 4 medications daily.  States her daughter and herself manages medications without difficulties and weekly pill box fills.  Patient does report Eliquis with high copay.  Daughter has spoken with [place] to determine out of pocket eligibility for medication assistance.  Encounter Medications:  Outpatient Encounter Medications as of 10/13/2018  Medication Sig Note  . acetaminophen (TYLENOL) 500 MG tablet Take 1,000 mg by mouth as needed.   Marland Kitchen apixaban (ELIQUIS) 5 MG TABS tablet Take 1 tablet (5 mg total) by mouth 2 (two) times daily.   . clopidogrel (PLAVIX) 75 MG tablet Take 1 tablet (75 mg total) by mouth daily.   . nitroGLYCERIN (NITROSTAT) 0.4 MG SL tablet Place 1 tablet (0.4 mg total) under the tongue every 5 (five) minutes as needed for chest pain.   Marland Kitchen ondansetron (ZOFRAN-ODT) 4 MG disintegrating tablet Take 4 mg by mouth every 8 (eight) hours as needed for nausea or vomiting.    . traMADol (ULTRAM) 50 MG tablet Take 50 mg by mouth every 6 (six) hours as needed.   Marland Kitchen atorvastatin (LIPITOR) 40 MG tablet Take 1 tablet (40 mg total) by mouth 3 (three) times a  week. 10/13/2018: Continues to take  . metoprolol succinate (TOPROL-XL) 25 MG 24 hr tablet Take 0.5 tablets (12.5 mg total) by mouth daily. 10/13/2018: Continues to take  . promethazine (PHENERGAN) 12.5 MG tablet Take 1 tablet (12.5 mg total) by mouth every 6 (six) hours as needed for up to 7 days for nausea or vomiting.    No facility-administered  encounter medications on file as of 10/13/2018.     Functional Status:  In your present state of health, do you have any difficulty performing the following activities: 10/13/2018 05/14/2018  Hearing? N N  Vision? Y N  Comment legally blind due to macular degeneration -  Difficulty concentrating or making decisions? N N  Walking or climbing stairs? Tempie Donning  Comment trouble climbing stairs, has chair lift for her stairs -  Dressing or bathing? Tempie Donning  Comment needs assistance at times -  Doing errands, shopping? Tempie Donning  Comment has to have assistance -  Conservation officer, nature and eating ? Y -  Comment husband assist -  Using the Toilet? N -  In the past six months, have you accidently leaked urine? Y -  Comment wears depends -  Do you have problems with loss of bowel control? N -  Managing your Medications? Y -  Comment daughter assist -  Managing your Finances? N -  Housekeeping or managing your Housekeeping? Y -  Comment daughter and husband assist -  Some recent data might be hidden    Fall/Depression Screening: Fall Risk  10/13/2018 04/09/2018  Falls in the past year? 1 No  Number falls in past yr: 1 -  Injury with Fall? 0 -  Risk for fall due to : History of fall(s);Medication side effect;Impaired balance/gait;Impaired vision;Impaired mobility -  Follow up Falls evaluation completed;Falls prevention discussed;Education provided -   PHQ 2/9 Scores 10/13/2018 04/09/2018  PHQ - 2 Score 0 0   THN CM Care Plan Problem One     Most Recent Value  Care Plan Problem One  Knowledge deficiet related to self care management of heart failure.  Role Documenting the Problem One  [place] for Problem One  Active  [facility] Long Term Goal   Patient will verbalize the meaning of the heart failure zones  [facility] Long Term Goal Start Date  10/13/18  Interventions for Problem One Long Term Goal  Reviewed and discussed care plan and goals, reviewed and discussed signs and symptoms of heart failure,  reviewed heart failure zones and signs and symptoms of each zone, encouraged patient to weigh herself daily (states she will continue to weigh every other day),  discussed when to call physician based on weight and heart failure action plan, reviewed medications and encouraged medication compliance, encouraged to keep and attend medical appointments, sending Living Better with Heart Failure Packet     Appointments:  Patient reporting she attended appointment with primary care provider in February 2020 and is unsure of when scheduled follow up is at this time. Encouraged to contact office to confirm appointment.  Attended appointment with Dr. Irish Lack, Cardiologist on 08/15/2018 and schedule follow up for August 2020.  Advanced Directives:  Reports having an Advanced Directive in place and does not wish to make changes at this time.   Consent:  [facility] reviewed and discussed.  Patient verbally agrees to Disease Management outreaches.  Plan: RN Health Coach will send primary MD barriers letter. RN Health Coach will route initial telephone assessment note to primary MD. RN  Health Coach will send patient Living Better with Heart Failure Educational Kit with Zone Magnet. RN Health Coach will make next telephone outreach to patient within the month of July.  [place] [phone] [email or web address]

## 2018-10-18 ENCOUNTER — Other Ambulatory Visit: Payer: Self-pay | Admitting: Interventional Cardiology

## 2018-10-23 ENCOUNTER — Other Ambulatory Visit: Payer: Self-pay | Admitting: *Deleted

## 2018-10-23 ENCOUNTER — Other Ambulatory Visit: Payer: Self-pay | Admitting: Pharmacist

## 2018-10-23 NOTE — Patient Outreach (Signed)
[facility]) Care Management  10/23/2018  [name] 1933-10-04 [id number]   Called patient's daughter [name]. HIPAA identifiers were obtained. [name] had questions about the patient assistance process.  1.  The 3% of income expenditure required by [employer] for [employer], is that household or just the patient? ---household  2.  Does the expenditure have to be just for the patient or the household.--BMS allows medication expenses to be calculated from patient and spouse.  3.  Why haven't they received a statement from the insurance about what the expenditures are? ---Unsure.  Patient was given the HTA concierge number to reach out to them to inquire about the EOB.  HTA reported: TROOP-$220 for the patient and $42 for her spouse Needed TROOP for BMS-$750-$800  Confirmed with Noreene Larsson Simcox that we have all of the patient's financial information and signed applications from her provider and patient.  Plan: Called patient's daughter back to let her know. She did not answer. Left a message. Call back in 5-7 business days.   Beecher Mcardle, PharmD, BCACP [facility] Clinical Pharmacist [phone]

## 2018-10-23 NOTE — Patient Outreach (Signed)
[facility]) Care Management  10/23/2018  [name] 1933/09/07 [id number]   RN Health Coach Daughter Medication Question Follow Up  Referral Date:08/08/2018 Referral Source:Transfer from [facility] Reason for Referral:Continued Disease Management Education Insurance:Health Team Advantage   Outreach Attempt:  Received message patient's daughter has questions related to medication assistance and has been trying to reach someone.  Outreach to daughter, [name].  HIPAA verified with daughter.  Daughter asking if the 3% patient has to pay to meet for patient assistance is combined with patient's husbands medications or just her medications?  And if the 3% is based of patient's income or patient and husbands combined income?  Instructed daughter I would reach out to [facility] Pharmacist Laurelyn Sickle regarding her questions and she should hear something back from Pharmacist soon.  Encouraged daughter to contact me if she has not gotten a response by next week.  Plan: Left voicemail message for [facility] Pharmacist [place]. RN Health Coach will send in basket message to [facility] Pharmacist requesting follow up with daughter.  [name] RN [facility] Care Management  RN Health Coach [phone] [email or web address]

## 2018-10-24 ENCOUNTER — Ambulatory Visit: Payer: PPO | Admitting: Pharmacist

## 2018-10-28 ENCOUNTER — Other Ambulatory Visit: Payer: Self-pay | Admitting: Pharmacy Technician

## 2018-10-28 NOTE — Patient Outreach (Signed)
[facility]) Care Management  10/28/2018  [name] [birth year] [id number]    Received all necessary paperwork, documents and signatures from both patient and provider for BMS patient assistance for Eliquis.  Submitted completed application via fax to BMS.  Will followup with BMS in 5-10 business days to inquire on status of application.  [name], CPhT Musician Care Management [phone]

## 2018-10-29 ENCOUNTER — Other Ambulatory Visit: Payer: Self-pay | Admitting: Pharmacy Technician

## 2018-10-29 NOTE — Patient Outreach (Signed)
[facility]) Care Management  10/29/2018  [name] 1933-10-08 [id number]   Successful outgoing call placed to patient's daughter [name] in regards to patient's BMS application for Eliquis. HIPAA identifiers verified.  Inquired of daughter the Medicare number for her mother and informed that BMS was calling for the number to process the application. [name] called her mother but her mother was unable to locate her Medicare card. Provided [name] my name and number and [name] informed she would call me back with that information.  Will followup if call is not returned.  Melia Hopes P. Lainie Daubert, CPhT Musician Care Management [phone]

## 2018-10-29 NOTE — Patient Outreach (Signed)
[facility]) Care Management  10/29/2018  Ronice Onishi Wickstrom 28-Mar-1934 [id number]  Incoming call received from patient's daughter Lajoyce Corners, HIPAA identifiers verified.  Lajoyce Corners called to inform that she had her mom's Medicare number and that she had already called BMS and provided them with that number. She wanted to inform me of that number for our records. The number is [id number].  Will followup as previously planned with BMS in 7-10 business days.  Ursula Dermody P. Shagun Wordell, CPhT Musician Care Management [phone]

## 2018-10-29 NOTE — Patient Outreach (Signed)
[facility]) Care Management  10/29/2018  [name] Oct 02, 1933 [id number]   Incoming call from BMS in which they left a voicemail as I was on another call.  [name] from [facility] called to inform that they were in need of the patient's Medicare A, B & D number in order to process the application. She informed on the message that this information could be given to the representative by calling [phone].  Will followup with patient as we do not have the Medicare A&B number on file just the part D number.  [name], CPhT Musician Care Management [phone]

## 2018-10-30 ENCOUNTER — Other Ambulatory Visit: Payer: Self-pay | Admitting: Pharmacist

## 2018-10-30 ENCOUNTER — Telehealth: Payer: Self-pay

## 2018-10-30 NOTE — Patient Outreach (Signed)
[facility]) Care Management  10/30/2018  Amneh Craig Bascomb Nov 24, 1933 [id number]  Called patient's daughter to follow up on medication assistance. Chart review shows Pattricia Boss has been in touch with the patient. Just wanted to follow up personally because the daughter said she left messages for me in the past and did not get a call back.  I called the patient's daughter on 10/23/2018 but did not get a call back.  HIPAA identifiers were obtained. Patient's daughter confirmed all was well. She provided BMS with her mother's new social security number.  Plan: Allow Noreene Larsson Simcox to continue to follow.  Follow up in 6 weeks.   Beecher Mcardle, PharmD, BCACP [facility] Clinical Pharmacist [phone]

## 2018-10-30 NOTE — Telephone Encounter (Signed)
Pt Asst application provider part completed & signed by Dr Katrinka Blazing (DOD) and faxed back to BMS

## 2018-10-31 ENCOUNTER — Telehealth: Payer: Self-pay | Admitting: Interventional Cardiology

## 2018-10-31 ENCOUNTER — Ambulatory Visit: Payer: Self-pay | Admitting: Pharmacist

## 2018-10-31 NOTE — Telephone Encounter (Signed)
New Message    [name] from [place] is calling about a form that was sent to them for the medication Eliquis. She says they need an updated for faxed back to them  [phone]

## 2018-10-31 NOTE — Telephone Encounter (Signed)
This is concerning the pts BMS pt asst application for Eliquis:  I called BMS back and s/w [name]. Per [name] they have not received the fax with [name] signature yet [name], [name] originally signed but form has to be signed by an MD) and need it so they can process the pts applications. I asked her to check her received faxes again as [name], CMA faxed the provider part of the pts application back to them yesterday.  [name] came back to phone and stated that they have received the form back with [name] signature.  She also states that we are using an old application and that they changed to a newer application in January this year. She states that processing may not accept the old application but that she is sending it in anyway as  COVID-19 has caused problems for everyone and processing may make an exception.

## 2018-11-07 ENCOUNTER — Other Ambulatory Visit: Payer: Self-pay | Admitting: Pharmacy Technician

## 2018-11-07 NOTE — Patient Outreach (Signed)
[facility]) Care Management  11/07/2018  [name] 1934-01-02 [id number]   Care coordination call placed to BMS in regards to patient's Eliquis application.  Spoke to [place] and she informed patient was DENIED for the program due to being over income. She informed the maximum household income amount for a household of 2 is $51, 720. Inquired of [name] how BMS came to this income amount because patient reported an income less than the maximum. [name] informed they do a soft credit type check to verify income. She informed if patient thinks the amount is incorrect then they would need so submit a federal tax return 1040.  Will route note to [facility] RPh Nunzio Cobbs for patient assistance case closure due to patient being over income for the program. [name], CPhT Musician Care Management [phone]

## 2018-11-10 ENCOUNTER — Other Ambulatory Visit: Payer: Self-pay | Admitting: Pharmacist

## 2018-11-10 NOTE — Patient Outreach (Signed)
[facility]) Care Management  11/10/2018  Dottye Tandon Mottola May 18, 1934 [id number]  Received an inbox from Pattricia Boss, CPhT with the following information :  "BMS has her income at 59,304 which makes her over the income max for a household of 2 which is 51,720.   Patient reported on application an income of approximately 27,000 with social security statements.   If she wants to appeal she would have to submit a 1040 tax return."       Called patient's daughter, Burnetta Sabin and discussed the information given by BMS. Burnetta Sabin said she would fax the patient's tax return to the [facility] office so we can send it to BMS.   Plan: Call patient's daughter back in 2-3 weeks.   Beecher Mcardle, PharmD, BCACP [facility] Clinical Pharmacist [phone]

## 2018-12-01 ENCOUNTER — Other Ambulatory Visit: Payer: Self-pay | Admitting: Interventional Cardiology

## 2018-12-01 ENCOUNTER — Other Ambulatory Visit: Payer: Self-pay | Admitting: Pharmacist

## 2018-12-01 NOTE — Patient Outreach (Signed)
[place] [facility]) Care Management  12/01/2018  [name] 10-11-1933 [id number]   Patient was called regarding mediation assistance with Eliquis. [place] previously denied the patient for being over income.  Patient's daughter was called on 11/10/2018 to discuss the decision and to talk about how to appeal.  Patient's daughter said she would fax the patient's tax return so an appeal could be started. Jill Simcox, CPhT is on PAL today but there is no documentation the patient's taxes were received.  I spoke to the patient today.  HIPAA identifiers were obtained.  Patient was not sure if her daughter sent the information or not.  Patient's daughter, [name]) was called. Unfortunately, she did not answer the phone. HIPAA compliant message was left on her voicemail.  Plan: Call patient back in 2-3 weeks. Await a call back from her daughter.   Elayne Guerin, PharmD, [place] Clinical Pharmacist [phone]

## 2018-12-02 ENCOUNTER — Other Ambulatory Visit: Payer: Self-pay | Admitting: Pharmacy Technician

## 2018-12-02 ENCOUNTER — Other Ambulatory Visit: Payer: Self-pay | Admitting: Pharmacist

## 2018-12-02 NOTE — Patient Outreach (Signed)
[place] [facility]) Care Management  12/02/2018  [name] 1934-03-01 [id number]    Received update proof of income for patient for BMS application for Eliquis.  Re Submitted application to BMS.  Will followup with BMS in 3-7 business days to inquire on status of application.  Kaylib Furness P. Nghia Mcentee, [place] Management [phone]

## 2018-12-02 NOTE — Patient Outreach (Signed)
[place] [facility]) Care Management  12/02/2018  Vicktoria Muckey Hirst 1933/12/02 [id number]   Patient's daughter called me back. She left a message on my voicemail stating that she had forgotten to fax her parents tax information to our office but that she was doing it today. Spoke with [employer], CPhT who confirmed the tax information was received via fax. Sharee Pimple will send the information to [place] patient assistance program.  Plan: Follow up with patient at original follow up date.   Elayne Guerin, PharmD, [place] Clinical Pharmacist [phone]

## 2018-12-08 ENCOUNTER — Other Ambulatory Visit: Payer: Self-pay | Admitting: Pharmacy Technician

## 2018-12-08 NOTE — Patient Outreach (Signed)
[facility]) Care Management  12/08/2018  [name] 01-12-1934 [id number]    Care coordination call placed to BMS in regards to patient's Eliquis application.  Spoke to [place] who says they did not receive the page with the 1040 and inquired if it could be refaxed.  Will refax application again for the 2nd time on 12/09/2018 when I am back in the office on 6/16.  Will followup with BMS in 2-3 business days to check on status of application.  [name] P. Estanislado Surgeon, [place] Management [phone]

## 2018-12-10 ENCOUNTER — Ambulatory Visit: Payer: Self-pay | Admitting: Pharmacist

## 2018-12-12 ENCOUNTER — Other Ambulatory Visit: Payer: Self-pay | Admitting: Pharmacy Technician

## 2018-12-12 NOTE — Patient Outreach (Signed)
[facility]) Care Management  12/12/2018  Jari Carollo Kramar 02/04/1934 [id number]   Care coordination call placed to BMS in regards to patient's application for Eliquis.  Spoke to [place] who informed patient had been APPROVED 12/09/2018-06/25/2019.Shawn informed a request had been made to the pharmacy and it usually takes 7-10 business days to arrive to patient's home.  Will followup with patient in 7-14 business days to inquire if medication was received.  Renel Ende P. Keyra Virella, [place] Management [phone]

## 2018-12-12 NOTE — Patient Outreach (Signed)
[facility]) Care Management  12/12/2018  Ngoc Detjen Gassen 02-08-1934 [id number]   ADDENDUM  Successful outreach placed to patient in regards to BMS application for Eliquis.  Spoke to patient's daughter, Ermalinda Barrios. HIPAA identifiers verified.  Informed Ermalinda Barrios that patient was approved for the program through the end of the year. Ermalinda Barrios informed her mom had just let her know this and they have scheduled delivery of the medication. Informed her that the patient needs to call that same number when she needs a refill instead of going through the local pharmacy. Ivey verbalized understanding.   Informed Ermalinda Barrios I would followup with her in a week to inquire if medication was received and she was agreeable.  Plan to followup in 7-10 business days.  Lindzee Gouge P. Danelly Hassinger, [place] Management [phone]

## 2018-12-19 ENCOUNTER — Other Ambulatory Visit: Payer: Self-pay | Admitting: Pharmacy Technician

## 2018-12-19 ENCOUNTER — Other Ambulatory Visit: Payer: Self-pay | Admitting: *Deleted

## 2018-12-19 NOTE — Patient Outreach (Signed)
[facility]) Care Management  12/19/2018  [name] [birth year] [id number]  Unsuccessful outreach call placed to patient in regards to BMS application for Eliquis.  Unfortunately patient did not answer the phone, HIPAA compliant voicemail left.  Was calling patient to inquire if she had received the Eliquis.  Will followup with patient in 3-7 business days.  [name] P. Kumar Falwell, [place] Management [phone]

## 2018-12-19 NOTE — Patient Outreach (Addendum)
[facility]) Care Management  12/19/2018  Nazaret Chea Anastas 09-05-1933 [id number]   Case Closure/Transition to [facility] Health/CCI  Referral Date:08/08/2018 Referral Source:Transfer from [place] Reason for Referral:Continued Disease Management Education Insurance:Health Team Advantage   Outreach:  Patient case has been transitioned to [facility] Health/CCI for further Care Management assistance.  [place] continues to work with patient for medication assistance.  Plan: RN Health Coach will close case at this time. RN Health Coach will send primary care provider Care Management Case Closure Letter. RN Health Coach will not make patient inactive with [facility] Care Management at this time, due to [place] still working with patient.  [place] [phone] [email or web address]

## 2018-12-24 ENCOUNTER — Other Ambulatory Visit: Payer: Self-pay | Admitting: Pharmacy Technician

## 2018-12-24 NOTE — Patient Outreach (Signed)
[place] [facility]) Care Management  12/24/2018  Militza Devery Mincey Dec 29, 1933 [id number]   Successful outreach call placed to patient in regards to BMS application for Eliquis.   Spoke to patient's daughter Ermalinda Barrios, HIPAA identifiers verified. Ermalinda Barrios informed they received a 90 days supply of medication. Discussed refill procedure with Ermalinda Barrios. Ivey informed she understood and informed no other questions or concerns at this time. Confirmed with Ermalinda Barrios that she had our name and number.  Will route note to [place] that patient assistance has been completed and will remove myself from care team.  Luiz Ochoa. Crewe Heathman, [place] Management [phone]

## 2018-12-29 ENCOUNTER — Other Ambulatory Visit: Payer: Self-pay | Admitting: Pharmacist

## 2018-12-29 NOTE — Patient Outreach (Signed)
[place] [facility]) Care Management  12/29/2018  [name] 1933-08-19 [id number]  Patient's pharmacy case is being closed because she received Eliquis at no cost from [place] Patient Assistance Program.  Her daughter, Ermalinda Barrios, communicated understanding on how to place a refill/reorder for the patient.     Plan: Close patient's case Send closure letters to patient and her provider.   Elayne Guerin, PharmD, [place] Clinical Pharmacist [phone]

## 2018-12-31 ENCOUNTER — Ambulatory Visit: Payer: PPO | Admitting: Pharmacist

## 2019-01-01 ENCOUNTER — Ambulatory Visit: Payer: PPO | Admitting: *Deleted

## 2019-01-16 ENCOUNTER — Other Ambulatory Visit: Payer: Self-pay | Admitting: *Deleted

## 2019-01-16 MED ORDER — METOPROLOL SUCCINATE ER 25 MG PO TB24: 25.0000 mg | ORAL_TABLET | Freq: Every day | ORAL | 0 refills | Status: DC

## 2019-03-18 ENCOUNTER — Other Ambulatory Visit: Payer: Self-pay

## 2019-03-18 NOTE — Patient Outreach (Addendum)
[facility]) Care Management  03/18/2019  [name] Aug 20, 1933 [id number]   Prisma referral received from [place] on 03/18/19.  Referral stated that patient is in need of transportation for upcoming medical appointments.  Patient requesting that spouse be able to accompany her due to patient being blind. Successful outreach to patient today.  Patient reports that she has been in communication with representative at [employer] [place] who is sending her the registration packet for the [employer]  Patient was encouraged to complete the [employer] Form and send back to [employer].  However, informed patient that minimal volunteers are available for this program due to [place]. Talked with patient about [facility] and [employer].  Patient should be eligible for service, however, I am not certain that they allow riders to have escorts.  Informed patient that I will contact [employer] Supervisor, Callie Fielding to inquire about this. Will follow up with patient when response is received and submit application if appropriate.      Addendum: Received response from Raymond at [facility] that they should be able to accommodate transportation for patient and spouse can accompany her.  Eligibility application submitted.  Will follow up with patient once application has been processed.    Ronn Melena, BSW Social Worker [phone]

## 2019-03-19 ENCOUNTER — Other Ambulatory Visit: Payer: Self-pay

## 2019-03-19 NOTE — Patient Outreach (Signed)
[facility]) Care Management  03/19/2019  [name] 1934/03/15 [id number]   Patient approved for transportation services with [facility].  Spoke to patient to inform her of this and provide number for reservation line.   Closing case as this time.  Ronn Melena, BSW Social Worker [phone]

## 2019-03-25 DIAGNOSIS — Z0000 Encounter for general adult medical examination without abnormal findings: Secondary | ICD-10-CM | POA: Diagnosis not present

## 2019-03-25 DIAGNOSIS — E559 Vitamin D deficiency, unspecified: Secondary | ICD-10-CM | POA: Diagnosis not present

## 2019-03-25 DIAGNOSIS — I5022 Chronic systolic (congestive) heart failure: Secondary | ICD-10-CM | POA: Diagnosis not present

## 2019-03-25 DIAGNOSIS — E782 Mixed hyperlipidemia: Secondary | ICD-10-CM | POA: Diagnosis not present

## 2019-03-25 DIAGNOSIS — R918 Other nonspecific abnormal finding of lung field: Secondary | ICD-10-CM | POA: Diagnosis not present

## 2019-03-25 DIAGNOSIS — I480 Paroxysmal atrial fibrillation: Secondary | ICD-10-CM | POA: Diagnosis not present

## 2019-03-25 DIAGNOSIS — R7303 Prediabetes: Secondary | ICD-10-CM | POA: Diagnosis not present

## 2019-03-25 DIAGNOSIS — N183 Chronic kidney disease, stage 3 (moderate): Secondary | ICD-10-CM | POA: Diagnosis not present

## 2019-03-25 DIAGNOSIS — R05 Cough: Secondary | ICD-10-CM | POA: Diagnosis not present

## 2019-04-02 NOTE — Progress Notes (Signed)
Cardiology Office Note   Date:  04/03/2019   ID:  [name], DOB [birth year], MRN [id number]  PCP:  [name], [name], [name]    No chief complaint on file.  CAD  Wt Readings from Last 3 Encounters:  04/03/19 214 lb (97.1 kg)  08/15/18 208 lb 12.8 oz (94.7 kg)  06/02/18 206 lb (93.4 kg)       History of Present Illness: [name] is a 85 y.o. female  wasfirstadmitted to the hospital 04/04/18 for anterior wall STEMI and found to have severe two-vessel disease and underwent PCI/DES to pLCx with plans for staged intervention to RCA (95% stenosis). Echo at that time revealed EF 40%. She was also noted to have a brief episodes of atrial fibrillation managed with amiodarone but anticoagulation was not initiated given quick transition to sinus rhythm. She was discharged home on 04/08/18, however represented back10/19/19 with complaints of SOB and orthopnea felt to be 2/2 acute systolic CHF. She was diuresed with IV lasix, however repeat echo showed further drop in EF to 30-35% as well as early evidence of thrombus. She was then transitioned from brilinta to plavix and eliquis was started for LV thrombus. She was seen on 05/08/18 and primary complaints included nausea, decreased po intake, and back pain. She was without anginal complaints and given lack of cardiac symptoms, was not interested in revascularization at that time. Eliquis was discontinued at that time given nausea. She was recommended to continue low dose amiodarone in an effort to maintain NSR. Family expressed concerns about taking so many prescription medications at that time. Lab work at that visit revealed Cr 1.61 (baseline ~1.2) and she was recommended to stop entresto and start losartan 25mg  daily.   She presented back to the hospital in November withdizziness, nausea, and vomiting.She denied any chest pain or pressure. No dyspnea. Basically poor p.o. intake for 2 to 3 days. Had only been taking Lasix and  spironolactone during that timeframe.She was noted to be orthostatic and with renal insuffiencey. She was seen by Dr. Herbie BaltimoreHarding and he recommended admission. Her diuretics were held. Losartan also held. IVFs given. Amiodarone was felt to be the main cause of her symptoms. This was discontinued. She was monitored on tele. No recurrent afib. She was also given PRN antiemetics. Condition improved. It was advised that she continue to hold Lasix on discharge and to only take PRN if > 3 lb weight gain in 24 hrs. Other med changes were as follows. Losartan and spironolactone permanently discontinued. Herhome dose of Toprol was reducedto 12.5 mg;She was alsoconvertedfrom Plavix to Brilinta (180 mg p.o. X 1beginning11/23 followed by 90 mg twice daily beginning p.m. 11/23 ).  She had multiple medication adjustments.  Stopping amiodarone seem to be the biggest cause of her improvement.  She was feeling well at her appointment in December 2019.  Since the last visit, she has done well.  Not much walking due to "laziness. "   Denies : Chest pain. Dizziness. Leg edema. Nitroglycerin use. Orthopnea. Palpitations. Paroxysmal nocturnal dyspnea. Shortness of breath. Syncope.    Past Medical History:  Diagnosis Date  . Acute systolic heart failure (HCC)   . Arthritis   . CHF (congestive heart failure) (HCC)   . Coronary artery disease   . Hyperlipidemia   . PAF (paroxysmal atrial fibrillation) (HCC)   . STEMI (ST elevation myocardial infarction) (HCC)    04/04/18 PCI/DES to the pLAD, 95% dRCA (possible staged intervention), 75% mLAD, EF 45%  Past Surgical History:  Procedure Laterality Date  . CHOLECYSTECTOMY    . CHOLECYSTECTOMY  08/09/2011   Procedure: LAPAROSCOPIC CHOLECYSTECTOMY;  Surgeon: Merrie Roof, MD;  Location: [facility];  Service: General;  Laterality: N/A;  . CORONARY/GRAFT ACUTE MI REVASCULARIZATION N/A 04/04/2018   Procedure: Coronary/Graft Acute MI Revascularization;  Surgeon:  Jettie Booze, MD;  Location: [facility];  Service: Cardiovascular;  Laterality: N/A;  . HERNIA REPAIR    . LEFT HEART CATH AND CORONARY ANGIOGRAPHY N/A 04/04/2018   Procedure: LEFT HEART CATH AND CORONARY ANGIOGRAPHY;  Surgeon: Jettie Booze, MD;  Location: [facility];  Service: Cardiovascular;  Laterality: N/A;  . TUBAL LIGATION       Current Outpatient Medications  Medication Sig Dispense Refill  . acetaminophen (TYLENOL) 500 MG tablet Take 1,000 mg by mouth as needed.    Marland Kitchen apixaban (ELIQUIS) 5 MG TABS tablet Take 1 tablet (5 mg total) by mouth 2 (two) times daily. 180 tablet 3  . atorvastatin (LIPITOR) 40 MG tablet Take 1 tablet (40 mg total) by mouth 3 (three) times a week. 90 tablet 3  . clopidogrel (PLAVIX) 75 MG tablet TAKE 1 TABLET(75 MG) BY MOUTH DAILY 90 tablet 2  . metoprolol succinate (TOPROL-XL) 25 MG 24 hr tablet Take 1 tablet (25 mg total) by mouth daily. 90 tablet 0  . nitroGLYCERIN (NITROSTAT) 0.4 MG SL tablet Place 1 tablet (0.4 mg total) under the tongue every 5 (five) minutes as needed for chest pain. 25 tablet 2  . ondansetron (ZOFRAN-ODT) 4 MG disintegrating tablet Take 4 mg by mouth every 8 (eight) hours as needed for nausea or vomiting.   0  . promethazine (PHENERGAN) 12.5 MG tablet Take 1 tablet (12.5 mg total) by mouth every 6 (six) hours as needed for up to 7 days for nausea or vomiting. 28 tablet 0  . traMADol (ULTRAM) 50 MG tablet Take 50 mg by mouth every 6 (six) hours as needed.     No current facility-administered medications for this visit.     Allergies:   Amiodarone and Cymbalta [duloxetine hcl]    Social History:  The patient  reports that she has never smoked. She has never used smokeless tobacco. She reports that she does not drink alcohol or use drugs.   Family History:  The patient's family history includes Hypertension in her mother.    ROS:  Please see the history of present illness.   Otherwise, review of systems  are positive for inactivity.   All other systems are reviewed and negative.    PHYSICAL EXAM: VS:  BP 120/68   Pulse 83   Ht 5\' 4"  (1.626 m)   Wt 214 lb (97.1 kg)   SpO2 94%   BMI 36.73 kg/m  , BMI Body mass index is 36.73 kg/m. GEN: Well nourished, well developed, in no acute distress  HEENT: normal  Neck: no JVD, carotid bruits, or masses Cardiac: RRR; no murmurs, rubs, or gallops,no edema  Respiratory:  clear to auscultation bilaterally, normal work of breathing GI: soft, nontender, nondistended, + BS MS: no deformity or atrophy  Skin: warm and dry, no rash Neuro:  Strength and sensation are intact Psych: euthymic mood, full affect   EKG:   The ekg ordered today demonstrates NSR, mild anterior T wave inversion- no change since last year.     Recent Labs: 04/15/2018: Magnesium 1.9 05/08/2018: TSH 1.470 05/14/2018: ALT 23; B Natriuretic Peptide 337.1; Hemoglobin 12.6; Platelets 178  06/02/2018: BUN 23; Creatinine, Ser 1.09; Potassium 4.9; Sodium 141   Lipid Panel    Component Value Date/Time   CHOL 230 (H) 04/04/2018 1248   TRIG 271 (H) 04/04/2018 1248   HDL 50 04/04/2018 1248   CHOLHDL 4.6 04/04/2018 1248   VLDL 54 (H) 04/04/2018 1248   LDLCALC 126 (H) 04/04/2018 1248     Other studies Reviewed: Additional studies/ records that were reviewed today with results demonstrating: labs reviewed .   ASSESSMENT AND PLAN:  1. CAD/Old MI: No clear angina.  Contiue aggressive secondary prevention.  S/p LAD stent. No NTG usage despite RCA disease.  COntinue medical therapy.  No need for complex RCA intervention at this time since she is doing well.  2. Hyperlipidemia: LDL 126 in 10/19.  68 in 02/2019 3. Chronic systolic heart failure: appears euvolemic.Marland Kitchen 4. PAF: intolerant of amiodarone. Eliquis for stroke prevention. In NSR.  CBC today.   Current medicines are reviewed at length with the patient today.  The patient concerns regarding her medicines were addressed.  The  following changes have been made:  No change  Labs/ tests ordered today include:  No orders of the defined types were placed in this encounter.   Recommend 150 minutes/week of aerobic exercise Low fat, low carb, high fiber diet recommended  Disposition:   FU in 1 year   Signed, Lance Muss, MD  04/03/2019 10:19 AM    [facility] [address] [place], [place], [place]  [address] Phone: [phone]; Fax: [phone]

## 2019-04-03 ENCOUNTER — Encounter: Payer: Self-pay | Admitting: Interventional Cardiology

## 2019-04-03 ENCOUNTER — Other Ambulatory Visit: Payer: Self-pay

## 2019-04-03 ENCOUNTER — Ambulatory Visit: Payer: PPO | Admitting: Interventional Cardiology

## 2019-04-03 VITALS — BP 120/68 | HR 83 | Ht 64.0 in | Wt 214.0 lb

## 2019-04-03 DIAGNOSIS — I5022 Chronic systolic (congestive) heart failure: Secondary | ICD-10-CM

## 2019-04-03 DIAGNOSIS — I252 Old myocardial infarction: Secondary | ICD-10-CM

## 2019-04-03 DIAGNOSIS — E785 Hyperlipidemia, unspecified: Secondary | ICD-10-CM

## 2019-04-03 DIAGNOSIS — I2510 Atherosclerotic heart disease of native coronary artery without angina pectoris: Secondary | ICD-10-CM | POA: Diagnosis not present

## 2019-04-03 DIAGNOSIS — I480 Paroxysmal atrial fibrillation: Secondary | ICD-10-CM

## 2019-04-03 LAB — CBC
Hematocrit: 41.6 % (ref 34.0–46.6)
Hemoglobin: 13.9 g/dL (ref 11.1–15.9)
MCH: 31.2 pg (ref 26.6–33.0)
MCHC: 33.4 g/dL (ref 31.5–35.7)
MCV: 93 fL (ref 79–97)
Platelets: 257 10*3/uL (ref 150–450)
RBC: 4.46 x10E6/uL (ref 3.77–5.28)
RDW: 12.8 % (ref 11.7–15.4)
WBC: 10 10*3/uL (ref 3.4–10.8)

## 2019-04-03 NOTE — Patient Instructions (Signed)
Medication Instructions:  Your physician recommends that you continue on your current medications as directed. Please refer to the Current Medication list given to you today.  If you need a refill on your cardiac medications before your next appointment, please call your pharmacy.   Lab work: TODAY: CBC  If you have labs (blood work) drawn today and your tests are completely normal, you will receive your results only by: Marland Kitchen MyChart Message (if you have MyChart) OR . A paper copy in the mail If you have any lab test that is abnormal or we need to change your treatment, we will call you to review the results.  Testing/Procedures: None ordered  Follow-Up: At [facility], you and your health needs are our priority.  As part of our continuing mission to provide you with exceptional heart care, we have created designated Provider Care Teams.  These Care Teams include your primary Cardiologist (physician) and Advanced Practice Providers (APPs -  Physician Assistants and Nurse Practitioners) who all work together to provide you with the care you need, when you need it. . You will need a follow up appointment in 1 year.  Please call our office 2 months in advance to schedule this appointment.  You may see Casandra Doffing, MD or one of the following Advanced Practice Providers on your designated Care Team:   . Lyda Jester, PA-C . Dayna Dunn, PA-C . Ermalinda Barrios, PA-C  Any Other Special Instructions Will Be Listed Below (If Applicable).

## 2019-05-25 ENCOUNTER — Other Ambulatory Visit: Payer: Self-pay

## 2019-05-25 MED ORDER — ATORVASTATIN CALCIUM 40 MG PO TABS: 40.0000 mg | ORAL_TABLET | ORAL | 2 refills | Status: DC

## 2019-05-25 MED ORDER — METOPROLOL SUCCINATE ER 25 MG PO TB24: 12.5000 mg | ORAL_TABLET | Freq: Every day | ORAL | 2 refills | Status: DC

## 2019-05-25 NOTE — Telephone Encounter (Signed)
Spoke with pt and daughter (pt gave verbal consent to speak with her on the phone) about refill request our office received from [facility]. Pt states that she is taking atorvastatin 40 mg three times weekly (M,W,F) since she was seen in the hospital in Nov of 2019. Pt had been seen by Dr. Irish Lack in Oct 2020 but list was never updated. Pt also states that she needed a refill on Toprol XL. The pt med list states that pt is taking 25 mg of Toprol QD but pt states that she is taking 12.5 mg daily, again since seen in the hospital last Oct. I have updated her list and sent over the proper refills for pt. Pt and daughter verbalized understanding and thanked me for the the call.

## 2019-10-12 ENCOUNTER — Other Ambulatory Visit: Payer: Self-pay

## 2019-10-12 MED ORDER — CLOPIDOGREL BISULFATE 75 MG PO TABS: ORAL_TABLET | ORAL | 1 refills | Status: DC

## 2019-10-15 ENCOUNTER — Other Ambulatory Visit: Payer: Self-pay

## 2019-10-15 ENCOUNTER — Telehealth: Payer: Self-pay | Admitting: Interventional Cardiology

## 2019-10-15 MED ORDER — METOPROLOL SUCCINATE ER 25 MG PO TB24: 12.5000 mg | ORAL_TABLET | Freq: Every day | ORAL | 1 refills | Status: DC

## 2019-10-15 MED ORDER — ATORVASTATIN CALCIUM 40 MG PO TABS: 40.0000 mg | ORAL_TABLET | ORAL | 1 refills | Status: DC

## 2019-10-15 MED ORDER — CLOPIDOGREL BISULFATE 75 MG PO TABS: ORAL_TABLET | ORAL | 1 refills | Status: DC

## 2019-10-15 NOTE — Telephone Encounter (Signed)
**Note De-Identified  Obfuscation** Burnetta Sabin states that she is emailing me the provider part of the pts [employer] pt asst application for Eliquis so I can complete and email to Dr Hoyle Barr nurse to obtain his signature, date it and to place downstairs for Burnetta Sabin to pick up tomorrow 4/23 along with a copy of the pts medication list and  2 boxes of Eliquis 5 mg samples.

## 2019-10-15 NOTE — Telephone Encounter (Signed)
[name], Daughter of the patient called. The patient accidentally got rid of her re-enrollment form for the Eliquis Assistance Program. The daughter was able to get a new form, but the daughter needs the form filled out and signed by the doctor, a new rx and also a copy of her current medication list to submit with the application.  Please let the daughter know how she can get the forms to Dr. Eldridge Dace to be filled out

## 2019-10-15 NOTE — Telephone Encounter (Signed)
Patient calling the office for samples of medication:   1.  What medication and dosage are you requesting samples for? apixaban (ELIQUIS) 5 MG TABS tablet  2.  Are you currently out of this medication? No. Patient has 2 days left (4 pills)  Daughter was hoping to get at least a month's worth of medication to hold her over while she re-applies for the [employer] Patient assistance program.

## 2019-10-15 NOTE — Telephone Encounter (Signed)
Epimenio Foot, LPN, can you advise in this matter please. Thank you

## 2019-10-15 NOTE — Telephone Encounter (Signed)
Application has been completed and signed by Dr. Eldridge Dace and left downstairs for patient's daughter to pick up tomorrow.

## 2019-10-15 NOTE — Telephone Encounter (Signed)
Earney Mallet, LPN, leaving 2 boxes of Eliquis samples downstairs for the daughter to pick up. Thanks

## 2019-10-15 NOTE — Telephone Encounter (Signed)
**Note De-Identified  Obfuscation** [name] states that she is emailing me the provider part of the pts [employer]MS pt asst application for Eliquis so I can complete and email to Dr Hoyle Barr nurse to obtain his signature, date it and to place downstairs for [name] to pick up tomorrow 4/23 along with 2 boxes of Eliquis 5 mg samples.

## 2019-10-23 NOTE — Telephone Encounter (Signed)
**Note De-Identified Veron Senner Obfuscation** Letter received from [employer] Pt Asst program Tilla Wilborn fax stating that they have approved the pt for asst with her Eliquis. Approval good until 06/24/20. Application Case: [id number]  The letter states that they have notified the pt of this approval as well.

## 2019-11-16 ENCOUNTER — Other Ambulatory Visit: Payer: Self-pay

## 2019-11-16 MED ORDER — METOPROLOL SUCCINATE ER 25 MG PO TB24: 12.5000 mg | ORAL_TABLET | Freq: Every day | ORAL | 1 refills | Status: DC

## 2019-12-15 ENCOUNTER — Telehealth: Payer: Self-pay | Admitting: Interventional Cardiology

## 2019-12-15 NOTE — Telephone Encounter (Signed)
   STAT if patient feels like he/she is going to faint   1) Are you dizzy now? no  2) Do you feel faint or have you passed out?  No   3) Do you have any other symptoms? Fatigue, no energy, tightness in chest   4) Have you checked your HR and BP (record if available)? No  Pt was in [employer] on Saturday pushing a cart and then all of a sudden she felt like she wouldn't be able to make it to the door. She said she had some tightness in her chest at the time but she feels better now.   She said at the time she was in [facility] it felt like the last time she had a heart attack. She said she felt very overwhelmed. She was able to make it home and once she sat in her chair to relax she felt better.  She just wasn't sure if she needed to come in to be seen by Dr. Eldridge Dace or not

## 2019-12-15 NOTE — Telephone Encounter (Signed)
Called and spoke to patient. She states that she has been having intermittent episodes of tightness in her chest, weakness, dizziness, and shortness of breath. She states that this is with exertion only. Denies NTG use, swelling, weight gain, palpitations, or any other Sx. Patient is s/p LAD stent in 2019 with RCA disease still present. Denies having any Sx at this time. Last episode was when she was at [facility] this past weekend. BP 133/79 HR 85. I have scheduled the patient to see Dr. Eldridge Dace on 6/25 at 3:20 PM. ER precautions reviewed. Patient verbalized understanding and thanked me for the call.

## 2019-12-16 NOTE — Progress Notes (Signed)
Cardiology Office Note   Date:  12/18/2019   ID:  CHI WOODHAM, DOB 1933/08/09, MRN [id number]  PCP:  Daylene Katayama, PA    No chief complaint on file.  CAD  Wt Readings from Last 3 Encounters:  12/18/19 214 lb 12.8 oz (97.4 kg)  04/03/19 214 lb (97.1 kg)  08/15/18 208 lb 12.8 oz (94.7 kg)       History of Present Illness: [name] is a 86 y.o. female  wasfirstadmitted to the hospital 04/04/18 for anterior wall STEMI and found to have severe two-vessel disease and underwent PCI/DES to proximal LAD with plans for staged intervention to RCA (95% stenosis). Echo at that time revealed EF 40%. She was also noted to have a brief episodes of atrial fibrillation managed with amiodarone but anticoagulation was not initiated given quick transition to sinus rhythm. She was discharged home on 04/08/18, however represented back10/19/19 with complaints of SOB and orthopnea felt to be 2/2 acute systolic CHF. She was diuresed with IV lasix, however repeat echo showed further drop in EF to 30-35% as well as early evidence of thrombus. She was then transitioned from brilinta to plavix and eliquis was started for LV thrombus. She was seen on 05/08/18 and primary complaints included nausea, decreased po intake, and back pain. She was without anginal complaints and given lack of cardiac symptoms, was not interested in revascularization at that time. Eliquis was discontinued at that time given nausea. She was recommended to continue low dose amiodarone in an effort to maintain NSR. Family expressed concerns about taking so many prescription medications at that time. Lab work at that visit revealed Cr 1.61 (baseline ~1.2) and she was recommended to stop entresto and start losartan 25mg  daily.   She presented back to the hospital in November withdizziness, nausea, and vomiting.She denied any chest pain or pressure. No dyspnea. Basically poor p.o. intake for 2 to 3 days. Had only been taking  Lasix and spironolactone during that timeframe.She was noted to be orthostatic and with renal insuffiencey. She was seen by Dr. December and he recommended admission. Her diuretics were held. Losartan also held. IVFs given. Amiodarone was felt to be the main cause of her symptoms. This was discontinued. She was monitored on tele. No recurrent afib. She was also given PRN antiemetics. Condition improved. It was advised that she continue to hold Lasix on discharge and to only take PRN if > 3 lb weight gain in 24 hrs. Other med changes were as follows. Losartan and spironolactone permanently discontinued. Herhome dose of Toprol was reducedto 12.5 mg;She was alsoconvertedfrom Plavix to Brilinta (180 mg p.o. X 1beginning11/23 followed by 90 mg twice daily beginning p.m. 11/23 ).  She had multiple medication adjustments. Stopping amiodarone seem to be the biggest cause of her improvement. She was feeling well at her appointment in December 2019.    In June 2021, she had a shopping trip.  She got very fatigued while in a store.  She feels she overdid it.  She reports that the symptoms felt somewhat like her prior MI but not nearly as intense.   On a normal day in the house, she can do her normal activities.  She will still feel tired.    She has been taking her metoprolol as a full tablet every other day.  She has not used nitroglycerin under her tongue.  She helps take care of of her husband as well.     Past Medical History:  Diagnosis  Date  . Acute systolic heart failure (HCC)   . Arthritis   . CHF (congestive heart failure) (HCC)   . Coronary artery disease   . Hyperlipidemia   . PAF (paroxysmal atrial fibrillation) (HCC)   . STEMI (ST elevation myocardial infarction) (HCC)    04/04/18 PCI/DES to the pLAD, 95% dRCA (possible staged intervention), 75% mLAD, EF 45%    Past Surgical History:  Procedure Laterality Date  . CHOLECYSTECTOMY    . CHOLECYSTECTOMY  08/09/2011   Procedure:  LAPAROSCOPIC CHOLECYSTECTOMY;  Surgeon: Robyne Askew, MD;  Location: [facility];  Service: General;  Laterality: N/A;  . CORONARY/GRAFT ACUTE MI REVASCULARIZATION N/A 04/04/2018   Procedure: Coronary/Graft Acute MI Revascularization;  Surgeon: Corky Crafts, MD;  Location: [facility] INVASIVE CV LAB;  Service: Cardiovascular;  Laterality: N/A;  . HERNIA REPAIR    . LEFT HEART CATH AND CORONARY ANGIOGRAPHY N/A 04/04/2018   Procedure: LEFT HEART CATH AND CORONARY ANGIOGRAPHY;  Surgeon: Corky Crafts, MD;  Location: [facility] INVASIVE CV LAB;  Service: Cardiovascular;  Laterality: N/A;  . TUBAL LIGATION       Current Outpatient Medications  Medication Sig Dispense Refill  . acetaminophen (TYLENOL) 500 MG tablet Take 1,000 mg by mouth as needed.    Marland Kitchen apixaban (ELIQUIS) 5 MG TABS tablet Take 1 tablet (5 mg total) by mouth 2 (two) times daily. 180 tablet 3  . atorvastatin (LIPITOR) 40 MG tablet Take 1 tablet (40 mg total) by mouth 3 (three) times a week. 45 tablet 1  . clopidogrel (PLAVIX) 75 MG tablet TAKE 1 TABLET(75 MG) BY MOUTH DAILY 90 tablet 1  . metoprolol succinate (TOPROL-XL) 25 MG 24 hr tablet Take 0.5 tablets (12.5 mg total) by mouth daily. 45 tablet 1  . nitroGLYCERIN (NITROSTAT) 0.4 MG SL tablet Place 1 tablet (0.4 mg total) under the tongue every 5 (five) minutes as needed for chest pain. 25 tablet 2  . ondansetron (ZOFRAN-ODT) 4 MG disintegrating tablet Take 4 mg by mouth every 8 (eight) hours as needed for nausea or vomiting.   0  . promethazine (PHENERGAN) 12.5 MG tablet Take 1 tablet (12.5 mg total) by mouth every 6 (six) hours as needed for up to 7 days for nausea or vomiting. 28 tablet 0  . traMADol (ULTRAM) 50 MG tablet Take 50 mg by mouth every 6 (six) hours as needed.    . isosorbide mononitrate (IMDUR) 30 MG 24 hr tablet Take 1 tablet (30 mg total) by mouth daily. 90 tablet 3  . metoprolol tartrate (LOPRESSOR) 25 MG tablet Take 1 tablet (25 mg total) by mouth daily. 90 tablet 3     No current facility-administered medications for this visit.    Allergies:   Amiodarone and Cymbalta [duloxetine hcl]    Social History:  The patient  reports that she has never smoked. She has never used smokeless tobacco. She reports that she does not drink alcohol and does not use drugs.   Family History:  The patient's family history includes Hypertension in her mother.    ROS:  Please see the history of present illness.   Otherwise, review of systems are positive for episode of chest discomfort.   All other systems are reviewed and negative.    PHYSICAL EXAM: VS:  BP 116/60   Pulse 94   Ht 5\' 4"  (1.626 m)   Wt 214 lb 12.8 oz (97.4 kg)   SpO2 94%   BMI 36.87 kg/m  , BMI Body mass index  is 36.87 kg/m. GEN: Well nourished, well developed, in no acute distress  HEENT: normal  Neck: no JVD, carotid bruits, or masses Cardiac: RRR; no murmurs, rubs, or gallops,no edema  Respiratory:  clear to auscultation bilaterally, normal work of breathing GI: soft, nontender, nondistended, + BS MS: no deformity or atrophy  Skin: warm and dry, no rash Neuro:  Strength and sensation are intact Psych: euthymic mood, full affect   EKG:   The ekg ordered today demonstrates normal sinus rhythm, prolonged PR interval, poor R wave progression, no ST segment changes   Recent Labs: 04/03/2019: Hemoglobin 13.9; Platelets 257   Lipid Panel    Component Value Date/Time   CHOL 230 (H) 04/04/2018 1248   TRIG 271 (H) 04/04/2018 1248   HDL 50 04/04/2018 1248   CHOLHDL 4.6 04/04/2018 1248   VLDL 54 (H) 04/04/2018 1248   LDLCALC 126 (H) 04/04/2018 1248     Other studies Reviewed: Additional studies/ records that were reviewed today with results demonstrating:  .   ASSESSMENT AND PLAN:  1. CAD/Old MI: Status post LAD stent known residual RCA disease. Now with symptoms concerning for angina. She has been intolerant of several medications in the past. Amiodarone seemed to be her biggest  cause of problems. Losartan and spironolactone also had to be stopped. She would like to try taking metoprolol daily and see if this helps her symptoms.  We will also call in isosorbide 30 mg daily.  She will first try nitroglycerin under her tongue and see if this helps.  If it does, she can try the isosorbide 30 mg daily.  The prescription will be on hand and she would like it sent in since it takes some time to get it from the mail order pharmacy.  We discussed cardiac catheterization.  She would like to hold off.  I explained to her that it would be preferable to figure out what is going on with her heart before becomes any kind of emergency.  We will also check echocardiogram to further evaluate. 2. Hyperlipidemia: LDL 126.  Continue atorvastatin. 3. Chronic systolic heart failure: EF 30 to 35% back in 2019. 4. PAF: Has been on Eliquis for stroke prevention. Intolerant of amiodarone.   Current medicines are reviewed at length with the patient today.  The patient concerns regarding her medicines were addressed.  The following changes have been made:  No change  Labs/ tests ordered today include: None    Orders Placed This Encounter  Procedures  . EKG 12-Lead  . ECHOCARDIOGRAM COMPLETE    Recommend 150 minutes/week of aerobic exercise Low fat, low carb, high fiber diet recommended  Disposition:   FU in 2 months   Signed, Larae Grooms, MD  12/18/2019 4:48 PM    [facility] [address], [place], [place]  [address] Phone: [phone]; Fax: [phone]

## 2019-12-18 ENCOUNTER — Other Ambulatory Visit: Payer: Self-pay

## 2019-12-18 ENCOUNTER — Ambulatory Visit: Payer: Medicare HMO | Admitting: Interventional Cardiology

## 2019-12-18 ENCOUNTER — Encounter: Payer: Self-pay | Admitting: Interventional Cardiology

## 2019-12-18 ENCOUNTER — Telehealth: Payer: Self-pay | Admitting: Interventional Cardiology

## 2019-12-18 VITALS — BP 116/60 | HR 94 | Ht 64.0 in | Wt 214.8 lb

## 2019-12-18 DIAGNOSIS — I5022 Chronic systolic (congestive) heart failure: Secondary | ICD-10-CM

## 2019-12-18 DIAGNOSIS — E785 Hyperlipidemia, unspecified: Secondary | ICD-10-CM

## 2019-12-18 DIAGNOSIS — I2510 Atherosclerotic heart disease of native coronary artery without angina pectoris: Secondary | ICD-10-CM

## 2019-12-18 DIAGNOSIS — I480 Paroxysmal atrial fibrillation: Secondary | ICD-10-CM

## 2019-12-18 DIAGNOSIS — I252 Old myocardial infarction: Secondary | ICD-10-CM

## 2019-12-18 MED ORDER — METOPROLOL TARTRATE 25 MG PO TABS: 25.0000 mg | ORAL_TABLET | Freq: Every day | ORAL | 3 refills | Status: DC

## 2019-12-18 MED ORDER — ISOSORBIDE MONONITRATE ER 30 MG PO TB24: 30.0000 mg | ORAL_TABLET | Freq: Every day | ORAL | 3 refills | Status: DC

## 2019-12-18 NOTE — Telephone Encounter (Signed)
New Message:   Daughter called and wanted to know if she can come in with pt for her appt today with Dr Eldridge Dace?

## 2019-12-18 NOTE — Telephone Encounter (Signed)
Called and spoke to patient's daughter Lajoyce Corners (DPR on file). She is requesting to come up with the patient for her visit today. Made her aware that currently we have a visitor restriction with COVID and that we are limiting visitors to patients with cognitive or physical impairments. Dr. Eldridge Dace prefers that we call the daughter during the visit. She will bring the patient up and get her registered and then go back down and wait for the call during her visit at [phone].   Earlier today Dr. Eldridge Dace wanted the patient to be  tentatively set up for LHC with Dr. Herbie Baltimore next week. LHC tentatively arranged for 6/30. Daughter received a phone call from [facility] stating he patient was scheduled for a cath next week. Daughter was upset at this news. I explained to the daughter that we tentatively set her up to hold the slot and that we were going to discuss and decide at visit today. She verbalized understanding.

## 2019-12-18 NOTE — Patient Instructions (Signed)
Medication Instructions:  Your physician has recommended you make the following change in your medication:   1) Take Metoprolol 25mg  daily. 2) Take Imdur 30mg  daily.   *If you need a refill on your cardiac medications before your next appointment, please call your pharmacy*   Lab Work: None If you have labs (blood work) drawn today and your tests are completely normal, you will receive your results only by:  MyChart Message (if you have MyChart) OR  A paper copy in the mail If you have any lab test that is abnormal or we need to change your treatment, we will call you to review the results.   Testing/Procedures: Your physician has requested that you have an echocardiogram. Echocardiography is a painless test that uses sound waves to create images of your heart. It provides your doctor with information about the size and shape of your heart and how well your hearts chambers and valves are working. This procedure takes approximately one hour. There are no restrictions for this procedure.     Follow-Up: At [facility], you and your health needs are our priority.  As part of our continuing mission to provide you with exceptional heart care, we have created designated Provider Care Teams.  These Care Teams include your primary Cardiologist (physician) and Advanced Practice Providers (APPs -  Physician Assistants and Nurse Practitioners) who all work together to provide you with the care you need, when you need it.  We recommend signing up for the patient portal called "MyChart".  Sign up information is provided on this After Visit Summary.  MyChart is used to connect with patients for Virtual Visits (Telemedicine).  Patients are able to view lab/test results, encounter notes, upcoming appointments, etc.  Non-urgent messages can be sent to your provider as well.   To learn more about what you can do with MyChart, go to .    Your next appointment:   Pending  Echocardiogram results  Other Instructions None

## 2019-12-23 ENCOUNTER — Encounter (HOSPITAL_COMMUNITY): Admission: RE | Payer: Self-pay | Source: Home / Self Care

## 2019-12-23 ENCOUNTER — Ambulatory Visit (HOSPITAL_COMMUNITY): Admission: RE | Admit: 2019-12-23 | Payer: Medicare HMO | Source: Home / Self Care | Admitting: Cardiology

## 2019-12-23 SURGERY — LEFT HEART CATH AND CORONARY ANGIOGRAPHY
Anesthesia: LOCAL

## 2020-01-12 ENCOUNTER — Other Ambulatory Visit: Payer: Self-pay

## 2020-01-12 ENCOUNTER — Ambulatory Visit (HOSPITAL_COMMUNITY): Payer: Medicare HMO | Attending: Cardiology

## 2020-01-12 DIAGNOSIS — I480 Paroxysmal atrial fibrillation: Secondary | ICD-10-CM | POA: Diagnosis not present

## 2020-01-12 DIAGNOSIS — I252 Old myocardial infarction: Secondary | ICD-10-CM | POA: Insufficient documentation

## 2020-01-12 DIAGNOSIS — I5022 Chronic systolic (congestive) heart failure: Secondary | ICD-10-CM | POA: Insufficient documentation

## 2020-01-12 DIAGNOSIS — I2510 Atherosclerotic heart disease of native coronary artery without angina pectoris: Secondary | ICD-10-CM | POA: Diagnosis not present

## 2020-01-12 LAB — ECHOCARDIOGRAM COMPLETE
Area-P 1/2: 7.16 cm2
P 1/2 time: 408 msec
S' Lateral: 3.7 cm

## 2020-01-12 MED ORDER — PERFLUTREN LIPID MICROSPHERE
1.0000 mL | INTRAVENOUS | Status: AC | PRN
  Administered 2020-01-12: 1 mL via INTRAVENOUS

## 2020-02-03 ENCOUNTER — Telehealth: Payer: Self-pay | Admitting: Interventional Cardiology

## 2020-02-03 NOTE — Telephone Encounter (Signed)
Pt c/o medication issue:  1. Name of Medication: hydroxycloroquine, or ivermectin   2. How are you currently taking this medication (dosage and times per day)? Not currently taking  3. Are you having a reaction (difficulty breathing--STAT)? no  4. What is your medication issue? Patient states she is very interested in taking either medications in case she gets covid. She would like a prescription for one of them.

## 2020-02-03 NOTE — Telephone Encounter (Signed)
Return call to patient. She states that she does not want to get the covid vaccine. Made patient aware that we are recommending that all of our patients be vaccinated. She states that she just wants for Dr. Eldridge Dace to prescribe either hydroxychloroquine or ivermectin in the event that she does contract COVID. She denies having any Sx at this time. Made patient aware that that would be something she should speak to her PCP about.

## 2020-03-18 ENCOUNTER — Telehealth: Payer: Self-pay | Admitting: Interventional Cardiology

## 2020-03-18 NOTE — Telephone Encounter (Signed)
New message:     Patient daughter calling stating that her mother is ready to get a mointor. Please call patient daughter.

## 2020-03-18 NOTE — Telephone Encounter (Signed)
Called and spoke to daughter. She states that the patient has continued to have the same Sx as when she was seen in June. She feels exhausted and has dizziness with standing. She states that the patient gives out when having to walk short distances. She states that the patient is not having any chest pain but has been intermittently taking NTG. She states that the patient never started imdur because the NTG did not work.Marland KitchenMarland KitchenThey did not have any vitals to offer. She is asking about moving forward with the heart catheterization. Reviewed NTG use and ER precautions. Will forward to Dr. Eldridge Dace for review.

## 2020-03-18 NOTE — Telephone Encounter (Signed)
Will send to primary card nurse as I do not see an order for a monitor.

## 2020-03-19 ENCOUNTER — Emergency Department (HOSPITAL_COMMUNITY): Payer: Medicare HMO

## 2020-03-19 ENCOUNTER — Encounter (HOSPITAL_COMMUNITY): Payer: Self-pay

## 2020-03-19 ENCOUNTER — Inpatient Hospital Stay (HOSPITAL_COMMUNITY)
Admission: EM | Admit: 2020-03-19 | Discharge: 2020-03-22 | DRG: 243 | Disposition: A | Discharge status: Home Health | Payer: Medicare HMO | Attending: Internal Medicine | Admitting: Internal Medicine

## 2020-03-19 DIAGNOSIS — Z8249 Family history of ischemic heart disease and other diseases of the circulatory system: Secondary | ICD-10-CM

## 2020-03-19 DIAGNOSIS — Z955 Presence of coronary angioplasty implant and graft: Secondary | ICD-10-CM

## 2020-03-19 DIAGNOSIS — Z79891 Long term (current) use of opiate analgesic: Secondary | ICD-10-CM

## 2020-03-19 DIAGNOSIS — I442 Atrioventricular block, complete: Secondary | ICD-10-CM | POA: Diagnosis not present

## 2020-03-19 DIAGNOSIS — N182 Chronic kidney disease, stage 2 (mild): Secondary | ICD-10-CM | POA: Diagnosis present

## 2020-03-19 DIAGNOSIS — Z9049 Acquired absence of other specified parts of digestive tract: Secondary | ICD-10-CM

## 2020-03-19 DIAGNOSIS — I252 Old myocardial infarction: Secondary | ICD-10-CM | POA: Diagnosis not present

## 2020-03-19 DIAGNOSIS — Z20822 Contact with and (suspected) exposure to covid-19: Secondary | ICD-10-CM | POA: Diagnosis present

## 2020-03-19 DIAGNOSIS — Z7902 Long term (current) use of antithrombotics/antiplatelets: Secondary | ICD-10-CM | POA: Diagnosis not present

## 2020-03-19 DIAGNOSIS — I25110 Atherosclerotic heart disease of native coronary artery with unstable angina pectoris: Secondary | ICD-10-CM | POA: Diagnosis present

## 2020-03-19 DIAGNOSIS — R55 Syncope and collapse: Secondary | ICD-10-CM

## 2020-03-19 DIAGNOSIS — I480 Paroxysmal atrial fibrillation: Secondary | ICD-10-CM | POA: Diagnosis present

## 2020-03-19 DIAGNOSIS — W1830XA Fall on same level, unspecified, initial encounter: Secondary | ICD-10-CM | POA: Diagnosis present

## 2020-03-19 DIAGNOSIS — I471 Supraventricular tachycardia, unspecified: Secondary | ICD-10-CM

## 2020-03-19 DIAGNOSIS — I255 Ischemic cardiomyopathy: Secondary | ICD-10-CM | POA: Diagnosis present

## 2020-03-19 DIAGNOSIS — Z7901 Long term (current) use of anticoagulants: Secondary | ICD-10-CM | POA: Diagnosis not present

## 2020-03-19 DIAGNOSIS — Z79899 Other long term (current) drug therapy: Secondary | ICD-10-CM | POA: Diagnosis not present

## 2020-03-19 DIAGNOSIS — I200 Unstable angina: Secondary | ICD-10-CM | POA: Diagnosis not present

## 2020-03-19 DIAGNOSIS — E785 Hyperlipidemia, unspecified: Secondary | ICD-10-CM | POA: Diagnosis present

## 2020-03-19 DIAGNOSIS — R079 Chest pain, unspecified: Secondary | ICD-10-CM

## 2020-03-19 DIAGNOSIS — I25119 Atherosclerotic heart disease of native coronary artery with unspecified angina pectoris: Secondary | ICD-10-CM | POA: Diagnosis not present

## 2020-03-19 DIAGNOSIS — Z950 Presence of cardiac pacemaker: Secondary | ICD-10-CM

## 2020-03-19 DIAGNOSIS — Z888 Allergy status to other drugs, medicaments and biological substances status: Secondary | ICD-10-CM | POA: Diagnosis not present

## 2020-03-19 DIAGNOSIS — I5022 Chronic systolic (congestive) heart failure: Secondary | ICD-10-CM | POA: Diagnosis present

## 2020-03-19 LAB — COMPREHENSIVE METABOLIC PANEL
ALT: 17 U/L (ref 0–44)
AST: 21 U/L (ref 15–41)
Albumin: 3.6 g/dL (ref 3.5–5.0)
Alkaline Phosphatase: 63 U/L (ref 38–126)
Anion gap: 12 (ref 5–15)
BUN: 31 mg/dL — ABNORMAL HIGH (ref 8–23)
CO2: 26 mmol/L (ref 22–32)
Calcium: 8.9 mg/dL (ref 8.9–10.3)
Chloride: 103 mmol/L (ref 98–111)
Creatinine, Ser: 1.41 mg/dL — ABNORMAL HIGH (ref 0.44–1.00)
GFR calc Af Amer: 40 mL/min — ABNORMAL LOW (ref >60)
GFR calc non Af Amer: 34 mL/min — ABNORMAL LOW (ref >60)
Glucose, Bld: 107 mg/dL — ABNORMAL HIGH (ref 70–99)
Potassium: 3.8 mmol/L (ref 3.5–5.1)
Sodium: 141 mmol/L (ref 135–145)
Total Bilirubin: 0.8 mg/dL (ref 0.3–1.2)
Total Protein: 6.9 g/dL (ref 6.5–8.1)

## 2020-03-19 LAB — CBC WITH DIFFERENTIAL/PLATELET
Abs Immature Granulocytes: 0.02 10*3/uL (ref 0.00–0.07)
Basophils Absolute: 0 10*3/uL (ref 0.0–0.1)
Basophils Relative: 0 %
Eosinophils Absolute: 0.1 10*3/uL (ref 0.0–0.5)
Eosinophils Relative: 1 %
HCT: 38.4 % (ref 36.0–46.0)
Hemoglobin: 11.8 g/dL — ABNORMAL LOW (ref 12.0–15.0)
Immature Granulocytes: 0 %
Lymphocytes Relative: 14 %
Lymphs Abs: 1.4 10*3/uL (ref 0.7–4.0)
MCH: 28.8 pg (ref 26.0–34.0)
MCHC: 30.7 g/dL (ref 30.0–36.0)
MCV: 93.7 fL (ref 80.0–100.0)
Monocytes Absolute: 1 10*3/uL (ref 0.1–1.0)
Monocytes Relative: 11 %
Neutro Abs: 7.3 10*3/uL (ref 1.7–7.7)
Neutrophils Relative %: 74 %
Platelets: 276 10*3/uL (ref 150–400)
RBC: 4.1 MIL/uL (ref 3.87–5.11)
RDW: 16.1 % — ABNORMAL HIGH (ref 11.5–15.5)
WBC: 9.9 10*3/uL (ref 4.0–10.5)
nRBC: 0 % (ref 0.0–0.2)

## 2020-03-19 LAB — HEMOGLOBIN A1C
Hgb A1c MFr Bld: 6.2 % — ABNORMAL HIGH (ref 4.8–5.6)
Mean Plasma Glucose: 131.24 mg/dL

## 2020-03-19 LAB — TROPONIN I (HIGH SENSITIVITY)
Troponin I (High Sensitivity): 22 ng/L — ABNORMAL HIGH (ref <18)
Troponin I (High Sensitivity): 25 ng/L — ABNORMAL HIGH (ref <18)

## 2020-03-19 LAB — LIPASE, BLOOD: Lipase: 24 U/L (ref 11–51)

## 2020-03-19 LAB — RESPIRATORY PANEL BY RT PCR (FLU A&B, COVID)
Influenza A by PCR: NEGATIVE
Influenza B by PCR: NEGATIVE
SARS Coronavirus 2 by RT PCR: NEGATIVE

## 2020-03-19 LAB — MAGNESIUM: Magnesium: 1.7 mg/dL (ref 1.7–2.4)

## 2020-03-19 LAB — TSH: TSH: 0.515 u[IU]/mL (ref 0.350–4.500)

## 2020-03-19 MED ORDER — ASPIRIN EC 81 MG PO TBEC
81.0000 mg | DELAYED_RELEASE_TABLET | Freq: Every day | ORAL | Status: DC
  Administered 2020-03-20: 81 mg via ORAL
  Filled 2020-03-19 (×2): qty 1

## 2020-03-19 MED ORDER — ASPIRIN 81 MG PO CHEW
324.0000 mg | CHEWABLE_TABLET | Freq: Once | ORAL | Status: DC
  Filled 2020-03-19: qty 4

## 2020-03-19 MED ORDER — FERROUS SULFATE 325 (65 FE) MG PO TABS
325.0000 mg | ORAL_TABLET | Freq: Every day | ORAL | Status: DC
  Administered 2020-03-20 – 2020-03-21 (×2): 325 mg via ORAL
  Filled 2020-03-19 (×2): qty 1

## 2020-03-19 MED ORDER — ALPRAZOLAM 0.25 MG PO TABS: 0.2500 mg | ORAL_TABLET | Freq: Two times a day (BID) | ORAL | Status: DC | PRN

## 2020-03-19 MED ORDER — HEPARIN (PORCINE) 25000 UT/250ML-% IV SOLN: 1150.0000 [IU]/h | INTRAVENOUS | Status: DC

## 2020-03-19 MED ORDER — ASPIRIN 300 MG RE SUPP: 300.0000 mg | RECTAL | Status: AC

## 2020-03-19 MED ORDER — ISOSORBIDE MONONITRATE ER 30 MG PO TB24
30.0000 mg | ORAL_TABLET | Freq: Every day | ORAL | Status: DC
  Administered 2020-03-19 – 2020-03-21 (×3): 30 mg via ORAL
  Filled 2020-03-19 (×3): qty 1

## 2020-03-19 MED ORDER — TRAMADOL HCL 50 MG PO TABS: 50.0000 mg | ORAL_TABLET | Freq: Four times a day (QID) | ORAL | Status: DC | PRN

## 2020-03-19 MED ORDER — ONDANSETRON 4 MG PO TBDP
4.0000 mg | ORAL_TABLET | Freq: Three times a day (TID) | ORAL | Status: DC | PRN
  Filled 2020-03-19: qty 1

## 2020-03-19 MED ORDER — METOPROLOL SUCCINATE ER 25 MG PO TB24
25.0000 mg | ORAL_TABLET | Freq: Every day | ORAL | Status: DC
  Administered 2020-03-20 – 2020-03-21 (×2): 25 mg via ORAL
  Filled 2020-03-19 (×2): qty 1

## 2020-03-19 MED ORDER — ZOLPIDEM TARTRATE 5 MG PO TABS: 5.0000 mg | ORAL_TABLET | Freq: Every evening | ORAL | Status: DC | PRN

## 2020-03-19 MED ORDER — SODIUM CHLORIDE 0.9% FLUSH
3.0000 mL | Freq: Two times a day (BID) | INTRAVENOUS | Status: DC
  Administered 2020-03-21: 3 mL via INTRAVENOUS

## 2020-03-19 MED ORDER — HEPARIN (PORCINE) 25000 UT/250ML-% IV SOLN
1150.0000 [IU]/h | INTRAVENOUS | Status: DC
  Administered 2020-03-19: 1150 [IU]/h via INTRAVENOUS
  Filled 2020-03-19: qty 250

## 2020-03-19 MED ORDER — ASPIRIN 81 MG PO CHEW: 324.0000 mg | CHEWABLE_TABLET | ORAL | Status: AC

## 2020-03-19 MED ORDER — SODIUM CHLORIDE 0.9% FLUSH: 3.0000 mL | INTRAVENOUS | Status: DC | PRN

## 2020-03-19 MED ORDER — ONDANSETRON HCL 4 MG/2ML IJ SOLN: 4.0000 mg | Freq: Four times a day (QID) | INTRAMUSCULAR | Status: DC | PRN

## 2020-03-19 MED ORDER — ACETAMINOPHEN 325 MG PO TABS
650.0000 mg | ORAL_TABLET | ORAL | Status: DC | PRN
  Administered 2020-03-21: 650 mg via ORAL
  Filled 2020-03-19 (×3): qty 2

## 2020-03-19 MED ORDER — CLOPIDOGREL BISULFATE 75 MG PO TABS
75.0000 mg | ORAL_TABLET | Freq: Every day | ORAL | Status: DC
  Administered 2020-03-20 – 2020-03-21 (×2): 75 mg via ORAL
  Filled 2020-03-19 (×3): qty 1

## 2020-03-19 MED ORDER — ATORVASTATIN CALCIUM 40 MG PO TABS: 40.0000 mg | ORAL_TABLET | ORAL | Status: DC

## 2020-03-19 MED ORDER — MECLIZINE HCL 25 MG PO TABS
25.0000 mg | ORAL_TABLET | Freq: Every day | ORAL | Status: DC | PRN
  Filled 2020-03-19: qty 1

## 2020-03-19 MED ORDER — NITROGLYCERIN 0.4 MG SL SUBL
0.4000 mg | SUBLINGUAL_TABLET | SUBLINGUAL | Status: DC | PRN
  Administered 2020-03-20: 0.4 mg via SUBLINGUAL
  Filled 2020-03-19: qty 1

## 2020-03-19 MED ORDER — METOPROLOL TARTRATE 25 MG PO TABS: 25.0000 mg | ORAL_TABLET | Freq: Every day | ORAL | Status: DC

## 2020-03-19 MED ORDER — SODIUM CHLORIDE 0.9 % IV SOLN
250.0000 mL | INTRAVENOUS | Status: DC | PRN
  Administered 2020-03-20: 250 mL via INTRAVENOUS

## 2020-03-19 NOTE — ED Notes (Signed)
Attempted report 

## 2020-03-19 NOTE — ED Triage Notes (Signed)
Pt from home via ems; called out for cp, syncopal episode, slid from chair to floor, pt states she is unsure if she hit head; family witnessed;  pt on Eliquis and  Plavix; pt found to to be in SVT, rate 192; 24 total of adenosine given PTA; rate 104 now; possibly hit head; L orig pressure 80/44, 500 ns given; 140/80 now; denies CP currently  HR 104   RR 18 140/80 98%

## 2020-03-19 NOTE — ED Notes (Signed)
Pt ambulated to bathroom and felt weak but was somewhat steady on her feet. Pt was asked if she wanted an external catheter but she refused as she did not want to take her pants off because she was cold. EMT will ask again at next round. Pt was comfortable and call bell within reach and family at bedside.

## 2020-03-19 NOTE — H&P (Addendum)
Cardiology Admission History and Physical:   Patient ID: [name] MRN: [id number]; DOB: [birth year]   Admission date: 03/19/2020  Primary Care Provider: Daylene Katayama, PA [facility] Cardiologist: Lance Muss, MD  [facility] Electrophysiologist:  None   Chief Complaint:  Chest pain and syncope  Patient Profile:   [name] is a 86 y.o. female with pmh of CAD PCI/DES to pLAD and staged RCA (95% stenosis), ischemic cardiomyopathy with (EF 40%, but as low as 30%), brief episodes of paroxysmal afib not on a/c, chronic systolic CHF, prior thrombus, CKD who presents with chest pain and syncope.   History of Present Illness:   [name] is followed by Dr. Eldridge Dace. She had anterior STEMI in 04/15/2018 treated with PCI/DES to pLAD with plans for staged RCA (95% stenosis(, EF 40% at the time. Also had brief episodes of afib but not started on a/c. Started on amiodarone. Came back in 4 days after being discharged, on 10/192019 for acute CHF and was diuresed. Echo showed further drop of EF to 30-35% and evidence of thrombus. She was transitioned from brilinta to plavix and eliquis. She was seen in the office 04/2018 with nausea, decreased po intake. She was not interested in revascularization at that time. Eliquis was d/c'd for nausea and she remained on amiodarone. Due to elevated creatinine (1.61 (basleine 1.2) Entresto was stopped and Losartan was started.   She presented back to the hospital for dizzines, nasuea, vomiting, and poor po intake. Had been taking lasix and spiro. She was orthostatic with renal insufficiency. Diuretics were held. Amiodarone was discontinued. NO recurrent afib. She was discharged with lasix PRN. toprol was reduced. Losartan and spiro was permanently discontinued. Plavix was switched to Brilinta.   She was last seen 12/18/19 and reported general fatigue and sxs concerning for angina. Wanted to tru Metoprolol daily. Imudr was started and SL nitro use  was encouraged to use as needed. Echo was ordered which showed EF mildly improved pump function, 35-40%, RWMA, trivial MR.   The patient presented to ED 03/19/20 for chest pain and syncope. She was brought by EMS. Patient reported 2-3 days of chest pain/pressure, worse with activity. Chest pain is resolved at rest. Pain would last only a couple minutes. Had associated sob and some diaphoresis. No N/V. She had been taking SL nitro for the CP multiple times. Said pain was similar to MI in 2019. This morning while eating she syncopized and fell on the floor from sitting. She was unaware that she feel. She only felt dizziness and chest pressure prior to falling. Her husband called EMS who found her to be in SVT with heart rates in the 190s and SBPs in the 80s. She was given adenosine x3 (6, 12, 12) with conversion to sinus tachycardia>NSR. Chest pain resolved after this. Patient denies recent fever, chills, nausea, vomiting, LLE, orthopnea.   In the ED BP 100/85, pulse 96, RR 21, 97% O2. Labs showed potassium 3.8, glucose 107, creatinine a,41, Mag 1.7, normal LFTs, WBC 9.9, Hgb 11.8. Hs troponin 22>25. CXR showed low lung volumes with left lower lobe atelectasis vs airspace consolidation.EKG showed NSR 99bpm, 1st degree AV block, TWI aVL, and minimal STE inf leads. EMS strips show STE. CT head/neck showed no acute trauma. Cardiology was consulted for admission.   Patient lives with her husband and is able to perform ADLs. She uses a cane or Rolator to get around. No smoking, tobacco, alcohol history. Question as to whether she has been taking  BB daily.    Past Medical History:  Diagnosis Date  . Acute systolic heart failure (HCC)   . Arthritis   . CHF (congestive heart failure) (HCC)   . Coronary artery disease   . Hyperlipidemia   . PAF (paroxysmal atrial fibrillation) (HCC)   . STEMI (ST elevation myocardial infarction) (HCC)    04/04/18 PCI/DES to the pLAD, 95% dRCA (possible staged intervention),  75% mLAD, EF 45%    Past Surgical History:  Procedure Laterality Date  . CHOLECYSTECTOMY    . CHOLECYSTECTOMY  08/09/2011   Procedure: LAPAROSCOPIC CHOLECYSTECTOMY;  Surgeon: Robyne Askew, MD;  Location: [facility] ORS;  Service: General;  Laterality: N/A;  . CORONARY/GRAFT ACUTE MI REVASCULARIZATION N/A 04/04/2018   Procedure: Coronary/Graft Acute MI Revascularization;  Surgeon: Corky Crafts, MD;  Location[facility] INVASIVE CV LAB;  Service: Cardiovascular;  Laterality: N/A;  . HERNIA REPAIR    . LEFT HEART CATH AND CORONARY ANGIOGRAPHY N/A 04/04/2018   Procedure: LEFT HEART CATH AND CORONARY ANGIOGRAPHY;  Surgeon: Corky Crafts, MD;  Location: [facility] INVASIVE CV LAB;  Service: Cardiovascular;  Laterality: N/A;  . TUBAL LIGATION       Medications Prior to Admission: Prior to Admission medications   Medication Sig Start Date End Date Taking? Authorizing Provider  acetaminophen (TYLENOL) 500 MG tablet Take 1,000 mg by mouth every 4 (four) hours as needed for mild pain.    Yes [provider]  apixaban (ELIQUIS) 5 MG TABS tablet Take 1 tablet (5 mg total) by mouth 2 (two) times daily. 06/04/18  Yes Robbie Lis M, PA-C  Ascorbic Acid (VITAMIN C) 1000 MG tablet Take 500 mg by mouth daily.   Yes [provider]  ASTAXANTHIN PO Take 1 tablet by mouth daily.   Yes [provider]  atorvastatin (LIPITOR) 40 MG tablet Take 1 tablet (40 mg total) by mouth 3 (three) times a week. Patient taking differently: Take 40 mg by mouth 3 (three) times a week. Paulo Fruit, Friday 10/16/19  Yes Corky Crafts, MD  Barberry-Oreg Grape-Goldenseal (BERBERINE COMPLEX PO) Take 1 capsule by mouth daily.   Yes [provider]  clopidogrel (PLAVIX) 75 MG tablet TAKE 1 TABLET(75 MG) BY MOUTH DAILY Patient taking differently: Take 75 mg by mouth daily. TAKE 1 TABLET(75 MG) BY MOUTH DAILY 10/15/19  Yes Corky Crafts, MD  ferrous sulfate 325 (65 FE) MG tablet Take 325 mg  by mouth daily with breakfast.   Yes [provider]  HAWTHORN BERRY PO Take 1 tablet by mouth daily.   Yes [provider]  magnesium gluconate (MAGONATE) 500 MG tablet Take 500 mg by mouth daily.   Yes [provider]  meclizine (ANTIVERT) 25 MG tablet Take 25 mg by mouth daily as needed for dizziness.   Yes [provider]  metoprolol succinate (TOPROL-XL) 25 MG 24 hr tablet Take 0.5 tablets (12.5 mg total) by mouth daily. Patient taking differently: Take 25 mg by mouth daily.  11/16/19  Yes Corky Crafts, MD  Misc Natural Products (WHITE WILLOW BARK PO) Take 1 capsule by mouth daily.   Yes [provider]  Multiple Vitamins-Minerals (ANTI-OXIDANT FORMULA PO) Take 1 tablet by mouth daily.   Yes [provider]  multivitamin-lutein (OCUVITE-LUTEIN) CAPS capsule Take 1 capsule by mouth daily.   Yes [provider]  nitroGLYCERIN (NITROSTAT) 0.4 MG SL tablet Place 1 tablet (0.4 mg total) under the tongue every 5 (five) minutes as needed for chest  pain. 05/08/18  Yes Corky Crafts, MD  OIL OF OREGANO PO Take 1 capsule by mouth daily.   Yes [provider]  Omega-3 Fatty Acids (FISH OIL) 1000 MG CAPS Take 1,000 mg by mouth daily.   Yes [provider]  ondansetron (ZOFRAN-ODT) 4 MG disintegrating tablet Take 4 mg by mouth every 8 (eight) hours as needed for nausea or vomiting.  04/30/18  Yes [provider]  promethazine (PHENERGAN) 12.5 MG tablet Take 1 tablet (12.5 mg total) by mouth every 6 (six) hours as needed for up to 7 days for nausea or vomiting. 05/17/18 03/19/20 Yes Briant Cedar, MD  QUERCETIN PO Take 1 tablet by mouth daily.   Yes [provider]  traMADol (ULTRAM) 50 MG tablet Take 50 mg by mouth every 6 (six) hours as needed for moderate pain.    Yes [provider]  Turmeric 500 MG TABS Take 500 mg by mouth daily.   Yes [provider]  vitamin B-12  (CYANOCOBALAMIN) 1000 MCG tablet Take 1,000 mcg by mouth daily.   Yes [provider]  zinc gluconate 50 MG tablet Take 50 mg by mouth daily.   Yes [provider]  isosorbide mononitrate (IMDUR) 30 MG 24 hr tablet Take 1 tablet (30 mg total) by mouth daily. 12/18/19 03/17/20  Corky Crafts, MD  metoprolol tartrate (LOPRESSOR) 25 MG tablet Take 1 tablet (25 mg total) by mouth daily. 12/18/19 03/17/20  Corky Crafts, MD     Allergies:    Allergies  Allergen Reactions  . Amiodarone Nausea And Vomiting  . Cymbalta [Duloxetine Hcl] Other (See Comments)    "Made me feel crazy"    Social History:   Social History   Socioeconomic History  . Marital status: Married    Spouse name: Not on file  . Number of children: Not on file  . Years of education: Not on file  . Highest education level: Not on file  Occupational History  . Not on file  Tobacco Use  . Smoking status: Never Smoker  . Smokeless tobacco: Never Used  Vaping Use  . Vaping Use: Never used  Substance and Sexual Activity  . Alcohol use: No  . Drug use: No  . Sexual activity: Not on file  Other Topics Concern  . Not on file  Social History Narrative  . Not on file   Social Determinants of Health   Financial Resource Strain:   . Difficulty of Paying Living Expenses: Not on file  Food Insecurity:   . Worried About Programme researcher, broadcasting/film/video in the Last Year: Not on file  . Ran Out of Food in the Last Year: Not on file  Transportation Needs:   . Lack of Transportation (Medical): Not on file  . Lack of Transportation (Non-Medical): Not on file  Physical Activity:   . Days of Exercise per Week: Not on file  . Minutes of Exercise per Session: Not on file  Stress:   . Feeling of Stress : Not on file  Social Connections:   . Frequency of Communication with Friends and Family: Not on file  . Frequency of Social Gatherings with Friends and Family: Not on file  . Attends Religious Services: Not on  file  . Active Member of Clubs or Organizations: Not on file  . Attends Banker Meetings: Not on file  . Marital Status: Not on file  Intimate Partner Violence:   . Fear of Current or  Ex-Partner: Not on file  . Emotionally Abused: Not on file  . Physically Abused: Not on file  . Sexually Abused: Not on file    Family History:   The patient's family history includes Hypertension in her mother.    ROS:  Please see the history of present illness.  All other ROS reviewed and negative.     Physical Exam/Data:   Vitals:   03/19/20 1300 03/19/20 1400 03/19/20 1430 03/19/20 1445  BP: (!) 116/93 116/70 122/69   Pulse: 87 83 99 91  Resp: 16 13 19 10   SpO2: 95% 92% 91% 92%   No intake or output data in the 24 hours ending 03/19/20 1459 Last 3 Weights 12/18/2019 04/03/2019 08/15/2018  Weight (lbs) 214 lb 12.8 oz 214 lb 208 lb 12.8 oz  Weight (kg) 97.433 kg 97.07 kg 94.711 kg     There is no height or weight on file to calculate BMI.  General:  Well nourished, well developed, in no acute distress HEENT: normal Lymph: no adenopathy Neck: no JVD Endocrine:  No thryomegaly Vascular: No carotid bruits; FA pulses 2+ bilaterally without bruits  Cardiac:  normal S1, S2; RRR; no murmur  Lungs:  clear to auscultation bilaterally, no wheezing, rhonchi or rales  Abd: soft, nontender, no hepatomegaly  Ext: no edema Musculoskeletal:  No deformities, BUE and BLE strength normal and equal Skin: warm and dry  Neuro:  CNs 2-12 intact, no focal abnormalities noted Psych:  Normal affect    EKG:  The ECG that was done 03/19/20 was personally reviewed and demonstrates NSR, 1st degree AV block, 99bpm, TWI AVL, minimal STE inferior leads  Relevant CV Studies:  Echo 01/12/20 1. Left ventricular ejection fraction, by estimation, is 35 to 40%. Left  ventricular ejection fraction by 3D volume is 39 %. The left ventricle has  moderately decreased function. The left ventricle demonstrates  regional  wall motion abnormalities (see  scoring diagram/findings for description). Left ventricular diastolic  parameters are consistent with Grade I diastolic dysfunction (impaired  relaxation).  2. Right ventricular systolic function is normal. The right ventricular  size is normal. Tricuspid regurgitation signal is inadequate for assessing  PA pressure.  3. The mitral valve is normal in structure. Trivial mitral valve  regurgitation.  4. The aortic valve was not well visualized. Aortic valve regurgitation  is mild. No aortic stenosis is present.   Cardiac cath 03/2018  Dist RCA lesion is 95% stenosed. This occurs just before a high bifurcation of the large posterior lateral artery and posterior descending artery.  LV end diastolic pressure is severely elevated. LVEDP 30 mm Hg.  There is no aortic valve stenosis.  Ost LAD to Prox LAD lesion is 100% stenosed.  A drug-eluting stent was successfully placed using a STENT SYNERGY DES 3X38.  Post intervention, there is a 0% residual stenosis.  Mid LAD lesion is 75% stenosed, distal to the stented area.  Mid LM to Dist LM lesion is 30% stenosed.   Complex multivessel disease.  Given her age and elevated LVEDP, we did not think she would be a good candidate for emergency surgery.  Therefore, we stented the LAD to restore TIMI-3 flow.  There is still residual distal left main disease and mid LAD disease which was not treated today.  There is complex bifurcation disease on the right.  The plan will be to let her recover over the next 4 weeks, on dual antiplatelet therapy.  Then we can consider whether she would be  a candidate for bypass surgery after that.  Recommend uninterrupted dual antiplatelet therapy with Aspirin 81mg  daily and Ticagrelor 90mg  twice daily for a minimum of 12 months (ACS - Class I recommendation).  DAPT may need to be temporarily held for CABG after 30 days.   High dose statin, beta blocker and DAPT orally.   For now, will give IV Lasix and IV NTG to help with volume overload.  Coronary Diagrams  Diagnostic Dominance: Right  Intervention     Laboratory Data:  High Sensitivity Troponin:   Recent Labs  Lab 03/19/20 1133 03/19/20 1410  TROPONINIHS 22* 25*      Chemistry Recent Labs  Lab 03/19/20 1133  NA 141  K 3.8  CL 103  CO2 26  GLUCOSE 107*  BUN 31*  CREATININE 1.41*  CALCIUM 8.9  GFRNONAA 34*  GFRAA 40*  ANIONGAP 12    Recent Labs  Lab 03/19/20 1133  PROT 6.9  ALBUMIN 3.6  AST 21  ALT 17  ALKPHOS 63  BILITOT 0.8   Hematology Recent Labs  Lab 03/19/20 1133  WBC 9.9  RBC 4.10  HGB 11.8*  HCT 38.4  MCV 93.7  MCH 28.8  MCHC 30.7  RDW 16.1*  PLT 276   BNPNo results for input(s): BNP, PROBNP in the last 168 hours.  DDimer No results for input(s): DDIMER in the last 168 hours.   Radiology/Studies:  CT Head Wo Contrast  Result Date: 03/19/2020 CLINICAL DATA:  Head trauma. EXAM: CT HEAD WITHOUT CONTRAST TECHNIQUE: Contiguous axial images were obtained from the base of the skull through the vertex without intravenous contrast. COMPARISON:  None. FINDINGS: Brain: No evidence of acute infarction, hemorrhage, hydrocephalus, extra-axial collection or mass lesion/mass effect. Moderate brain parenchymal volume loss and mild deep white matter microangiopathy. Vascular: Calcific atherosclerotic disease of the intra cavernous carotid arteries. Skull: Normal. Negative for fracture or focal lesion. Sinuses/Orbits: No acute finding. Other: None. IMPRESSION: 1. No acute intracranial abnormality. 2. Moderate brain parenchymal atrophy and chronic microvascular disease. Electronically Signed   By: 03/21/20 M.D.   On: 03/19/2020 13:05   CT Cervical Spine Wo Contrast  Result Date: 03/19/2020 CLINICAL DATA:  Neck trauma. EXAM: CT CERVICAL SPINE WITHOUT CONTRAST TECHNIQUE: Multidetector CT imaging of the cervical spine was performed without intravenous contrast.  Multiplanar CT image reconstructions were also generated. COMPARISON:  None. FINDINGS: Alignment: Levoconvex scoliosis of the cervical spine. Skull base and vertebrae: No acute fracture. No primary bone lesion or focal pathologic process. Soft tissues and spinal canal: No prevertebral fluid or swelling. No visible canal hematoma. Disc levels: Mild multilevel osteoarthritic changes in the lower cervical spine. Upper chest: Negative. Other: None. IMPRESSION: 1. No evidence of acute traumatic injury to the cervical spine. 2. Levoconvex scoliosis of the cervical spine. 3. Mild multilevel osteoarthritic changes in the lower cervical spine. Electronically Signed   By: 03/21/2020 M.D.   On: 03/19/2020 13:01   DG Chest Port 1 View  Result Date: 03/19/2020 CLINICAL DATA:  Chest pain for 2 days.  Syncopal episode. EXAM: PORTABLE CHEST 1 VIEW COMPARISON:  March 25, 2019 FINDINGS: Cardiomediastinal silhouette is normal. Mediastinal contours appear intact. Low lung volumes with left lower lobe atelectasis versus airspace consolidation. Osseous structures are without acute abnormality. Soft tissues are grossly normal. IMPRESSION: Low lung volumes with left lower lobe atelectasis versus airspace consolidation. Electronically Signed   By: 03/21/2020 M.D.   On: 03/19/2020 12:12     Assessment and Plan:  Syncope/SVT - presents with 2 days of exertional chest pressure. Patient syncopized this morning found to be in SVT with heart rates in the 190s, converted with adenosine x 3. Strips from EMS also appeared to show some STE. In the ED EKG shows NSR with 1st degree AV block. CT head/neck without acute changes. Hs troponin mildly elevated. Kidney function 1.41 - In the ER patient is comfortable with no chest pain. Suspected syncope from SVT - start IV heparin - continue to trend troponin - check TSH. Keep Mag >2 and K+>4 - Most recent echo showed LVEF 35-40% - Last cath in 2019 as above. Plan to  re-cath to check for new ischemia. Hold Eliquis for cath - Continue BB. Question as to whether patient was taking it as prescribed - Might need ablation  Unstable Angina/CAD s/p DES/PCI to pLAD and RCA in 2019 - Last cath in 2019 as above. Even though patient was reporting anginal symptoms at last visit 11/2019 she was not interested in revascularization at that time.  - Plan to re-cath as above - HS troponin 22>25. Continue to trend - continue aspirin, plavix, statin, BB and Imdur - further recs per cath Risks and benefits of cardiac catheterization have been discussed with the patient.  These include bleeding, infection, kidney damage, stroke, heart attack, death.  The patient understands these risks and is willing to proceed. - NPO at midnight for cath on Monday  HLD - LDL 126 - continue statin - Re-check FLP  Chronic systolic CHF - EF as low as 30-35%. Most recent - NO ARB/entresto/ACe due to renal insufficieny - Continue Toprol - Euvolemic on exam  PAF - Eliquis for stroke prophylaxis. Hold as above for cath - Intolerant to amiodarone - Monitor for reoccurence  Severity of Illness: The appropriate patient status for this patient is INPATIENT. Inpatient status is judged to be reasonable and necessary in order to provide the required intensity of service to ensure the patient's safety. The patient's presenting symptoms, physical exam findings, and initial radiographic and laboratory data in the context of their chronic comorbidities is felt to place them at high risk for further clinical deterioration. Furthermore, it is not anticipated that the patient will be medically stable for discharge from the hospital within 2 midnights of admission. The following factors support the patient status of inpatient.   " The patient's presenting symptoms include chest pain and syncope. " The worrisome physical exam findings include chest pain. " The initial radiographic and laboratory data are  worrisome because of SVT. " The chronic co-morbidities include CAD and CHF.   * I certify that at the point of admission it is my clinical judgment that the patient will require inpatient hospital care spanning beyond 2 midnights from the point of admission due to high intensity of service, high risk for further deterioration and high frequency of surveillance required.*    For questions or updates, please contact [facility] Please consult [email or web address] for contact info under     Signed, Cadence Ardelle Lesches  03/19/2020 2:59 PM   Cardiology/EP Attending  Patient seen and examined. Agree with the findings as noted above with minimal modification. The patient presents with crescendo angina and SVT which caused syncope and was ultimately treated with IV adenosine. She feels better but had chest pressure just walking to the bathroom. She denies edema. Her labs have been reviewed and she has not had a stemi or NSTEMI. Exam is notable for a stable appearing woman in no  acute distress and her ECG reveals no acute changes. Her ECG of her SVT demonstrates a narrow QRS tachy at 190/min. I would recommend the patient be started on IV heparin and undergo left heart cath on Monday. If no intervention then EP study and catheter ablation of SVT. If she is intervened upon, then we could perform her SVT ablation in a couple of weeks as an outpatient.   Sharlot Gowda Magally Vahle,MD

## 2020-03-19 NOTE — ED Notes (Signed)
Pt given food and beverage per Dr. Rees 

## 2020-03-19 NOTE — Progress Notes (Signed)
ANTICOAGULATION CONSULT NOTE  Pharmacy Consult for Heparin Indication: chest pain/ACS and atrial fibrillation  Allergies  Allergen Reactions  . Amiodarone Nausea And Vomiting  . Cymbalta [Duloxetine Hcl] Other (See Comments)    "Made me feel crazy"    Patient Measurements: Height: 5\' 4"  (162.6 cm) Weight: 95.3 kg (210 lb) IBW/kg (Calculated) : 54.7 Heparin Dosing Weight: 74.6 kg  Vital Signs: Temp: 98 F (36.7 C) (09/25 1641) Temp Source: Oral (09/25 1641) BP: 119/71 (09/25 1630) Pulse Rate: 95 (09/25 1631)  Labs: Recent Labs    03/19/20 1133 03/19/20 1410  HGB 11.8*  --   HCT 38.4  --   PLT 276  --   CREATININE 1.41*  --   TROPONINIHS 22* 25*    Estimated Creatinine Clearance: 33.8 mL/min (A) (by C-G formula based on SCr of 1.41 mg/dL (H)).   Medical History: Past Medical History:  Diagnosis Date  . Acute systolic heart failure (HCC)   . Arthritis   . CHF (congestive heart failure) (HCC)   . Coronary artery disease   . Hyperlipidemia   . PAF (paroxysmal atrial fibrillation) (HCC)   . STEMI (ST elevation myocardial infarction) (HCC)    04/04/18 PCI/DES to the pLAD, 95% dRCA (possible staged intervention), 75% mLAD, EF 45%    Medications:  Scheduled:  . aspirin  324 mg Oral NOW   Or  . aspirin  300 mg Rectal NOW  . [START ON 03/20/2020] aspirin EC  81 mg Oral Daily  . [START ON 03/21/2020] atorvastatin  40 mg Oral Once per day on Mon Wed Fri  . [START ON 03/20/2020] clopidogrel  75 mg Oral Daily  . [START ON 03/20/2020] ferrous sulfate  325 mg Oral Q breakfast  . isosorbide mononitrate  30 mg Oral Daily  . [START ON 03/20/2020] metoprolol succinate  25 mg Oral Daily  . sodium chloride flush  3 mL Intravenous Q12H    Assessment: Patient is a 89 yof that presented with   Last dose of Apixaban was at 0900 on 9/25 (per med rec tech) some confusion if till on Apixaban  Goal of Therapy:  aPTT 66-102 seconds Monitor platelets by anticoagulation protocol:  Yes   Plan:  - No Heparin bolus as on Apixaban PTA last dose was at 0900 on 9/25. However with the confusion about the patient being anticoagulated or not will start the heparin drip now - Start heparin drip @ 1150 units/hr   - Will monitor heparin using aPTT until aPTT and Heparin levels correlate - aPTT level in ~ 8 hours  - Monitor patient for s/s of bleeding and CBC while on heparin   10/25 PharmD. BCPS  03/19/2020,6:12 PM

## 2020-03-19 NOTE — ED Provider Notes (Signed)
[facility] EMERGENCY DEPARTMENT Provider Note   CSN: [id number] Arrival date & time: 03/19/20  1055     History No chief complaint on file.   [name] is a 86 y.o. female.  The history is provided by the patient, medical records and the EMS personnel. No language interpreter was used.   [name] is a 86 y.o. female who presents to the Emergency Department complaining of pain and syncope. She presents the emergency department by EMS for evaluation following a syncopal event. She states that for the last two days she has been experiencing severe chest pressure when she tries to do any activity so she has been sitting in her recliner. Chest pressure is resolved when she is at rest. She also experiences low back pain when she attempts to ambulate. Today when she was eating breakfast she syncopized and fell to the floor and struck her head. Her husband called 911. On EMS arrival she was found to be in SVT with a heart rate in the 190s and a blood pressure in the 80s. She was treated with adenosine six, 12, 12 with conversion to sinus tachycardia. Her chest pain resolved after conversion to sinus rhythm. She has a history of coronary artery disease and MI. She takes Plavix and eloquence. She denies any recent illnesses or sick contacts. No fever, abdominal pain, nausea, vomiting. She does have mild cough. She has not been vaccinated for COVID-19.    Past Medical History:  Diagnosis Date  . Acute systolic heart failure (HCC)   . Arthritis   . CHF (congestive heart failure) (HCC)   . Coronary artery disease   . Hyperlipidemia   . PAF (paroxysmal atrial fibrillation) (HCC)   . STEMI (ST elevation myocardial infarction) (HCC)    04/04/18 PCI/DES to the pLAD, 95% dRCA (possible staged intervention), 75% mLAD, EF 45%    Patient Active Problem List   Diagnosis Date Noted  . Chronic systolic (congestive) heart failure (HCC) 08/15/2018  . Old MI (myocardial  infarction) 08/15/2018  . Chronic bilateral low back pain without sciatica 06/06/2018  . CKD (chronic kidney disease) stage 3, GFR 30-59 ml/min 05/15/2018  . Orthostatic hypotension 05/15/2018  . CAD (coronary artery disease) 05/15/2018  . Dehydration 05/15/2018  . AKI (acute kidney injury) (HCC) 05/14/2018  . Nausea & vomiting 05/14/2018  . CAD S/P percutaneous coronary angioplasty 05/06/2018  . Ischemic cardiomyopathy 05/06/2018  . Chronic systolic heart failure (HCC) 05/06/2018  . Acute CHF (congestive heart failure) (HCC) 04/12/2018  . Acute on chronic combined systolic and diastolic congestive heart failure (HCC) 04/12/2018  . PAF (paroxysmal atrial fibrillation) (HCC) 04/08/2018  . Acute systolic heart failure (HCC) 04/08/2018  . HLD (hyperlipidemia) 04/08/2018  . Acute anterior wall MI (HCC) 04/04/2018  . 1st degree AV block 01/22/2017  . Chronic right shoulder pain 01/22/2017  . Lichen sclerosus of female genitalia 01/22/2017  . Prediabetes 01/22/2017  . Vitamin D deficiency 12/14/2016  . Cholelithiasis with acute or chronic cholecystitis 08/10/2011  . Obesity (BMI 30.0-34.9) 08/10/2011    Past Surgical History:  Procedure Laterality Date  . CHOLECYSTECTOMY    . CHOLECYSTECTOMY  08/09/2011   Procedure: LAPAROSCOPIC CHOLECYSTECTOMY;  Surgeon: Robyne Askew, MD;  Location: [facility] ORS;  Service: General;  Laterality: N/A;  . CORONARY/GRAFT ACUTE MI REVASCULARIZATION N/A 04/04/2018   Procedure: Coronary/Graft Acute MI Revascularization;  Surgeon: Corky Crafts, MD;  Location: [facility] INVASIVE CV LAB;  Service: Cardiovascular;  Laterality: N/A;  .  HERNIA REPAIR    . LEFT HEART CATH AND CORONARY ANGIOGRAPHY N/A 04/04/2018   Procedure: LEFT HEART CATH AND CORONARY ANGIOGRAPHY;  Surgeon: Corky Crafts, MD;  Location: [facility] INVASIVE CV LAB;  Service: Cardiovascular;  Laterality: N/A;  . TUBAL LIGATION       OB History   No obstetric history on file.     Family History    Problem Relation Age of Onset  . Hypertension Mother     Social History   Tobacco Use  . Smoking status: Never Smoker  . Smokeless tobacco: Never Used  Vaping Use  . Vaping Use: Never used  Substance Use Topics  . Alcohol use: No  . Drug use: No    Home Medications Prior to Admission medications   Medication Sig Start Date End Date Taking? Authorizing Provider  acetaminophen (TYLENOL) 500 MG tablet Take 1,000 mg by mouth as needed.    [provider]  apixaban (ELIQUIS) 5 MG TABS tablet Take 1 tablet (5 mg total) by mouth 2 (two) times daily. 06/04/18   Robbie Lis M, PA-C  atorvastatin (LIPITOR) 40 MG tablet Take 1 tablet (40 mg total) by mouth 3 (three) times a week. 10/16/19   Corky Crafts, MD  clopidogrel (PLAVIX) 75 MG tablet TAKE 1 TABLET(75 MG) BY MOUTH DAILY 10/15/19   Corky Crafts, MD  isosorbide mononitrate (IMDUR) 30 MG 24 hr tablet Take 1 tablet (30 mg total) by mouth daily. 12/18/19 03/17/20  Corky Crafts, MD  metoprolol succinate (TOPROL-XL) 25 MG 24 hr tablet Take 0.5 tablets (12.5 mg total) by mouth daily. 11/16/19   Corky Crafts, MD  metoprolol tartrate (LOPRESSOR) 25 MG tablet Take 1 tablet (25 mg total) by mouth daily. 12/18/19 03/17/20  Corky Crafts, MD  nitroGLYCERIN (NITROSTAT) 0.4 MG SL tablet Place 1 tablet (0.4 mg total) under the tongue every 5 (five) minutes as needed for chest pain. 05/08/18   Corky Crafts, MD  ondansetron (ZOFRAN-ODT) 4 MG disintegrating tablet Take 4 mg by mouth every 8 (eight) hours as needed for nausea or vomiting.  04/30/18   [provider]  promethazine (PHENERGAN) 12.5 MG tablet Take 1 tablet (12.5 mg total) by mouth every 6 (six) hours as needed for up to 7 days for nausea or vomiting. 05/17/18 12/18/19  Briant Cedar, MD  traMADol (ULTRAM) 50 MG tablet Take 50 mg by mouth every 6 (six) hours as needed.    [provider]    Allergies    Amiodarone  and Cymbalta [duloxetine hcl]  Review of Systems   Review of Systems  All other systems reviewed and are negative.   Physical Exam Updated Vital Signs BP 100/85   Pulse 96   Resp (!) 21   SpO2 97%   Physical Exam Vitals and nursing note reviewed.  Constitutional:      Appearance: She is well-developed.  HENT:     Head: Normocephalic and atraumatic.  Cardiovascular:     Rate and Rhythm: Normal rate and regular rhythm.     Heart sounds: No murmur heard.   Pulmonary:     Effort: Pulmonary effort is normal. No respiratory distress.     Breath sounds: Normal breath sounds.  Abdominal:     Palpations: Abdomen is soft.     Tenderness: There is no abdominal tenderness. There is no guarding or rebound.  Musculoskeletal:        General: No swelling or tenderness.  Cervical back: Neck supple.  Skin:    General: Skin is warm and dry.  Neurological:     Mental Status: She is alert and oriented to person, place, and time.  Psychiatric:        Behavior: Behavior normal.     ED Results / Procedures / Treatments   Labs (all labs ordered are listed, but only abnormal results are displayed) Labs Reviewed  CBC WITH DIFFERENTIAL/PLATELET - Abnormal; Notable for the following components:      Result Value   Hemoglobin 11.8 (*)    RDW 16.1 (*)    All other components within normal limits  RESPIRATORY PANEL BY RT PCR (FLU A&B, COVID)  COMPREHENSIVE METABOLIC PANEL  LIPASE, BLOOD  MAGNESIUM  TROPONIN I (HIGH SENSITIVITY)    EKG EKG Interpretation  Date/Time:  Saturday March 19 2020 11:29:16 EDT Ventricular Rate:  99 PR Interval:    QRS Duration: 83 QT Interval:  349 QTC Calculation: 448 R Axis:   -45 Text Interpretation: Sinus rhythm Prolonged PR interval Left anterior fascicular block Abnormal R-wave progression, late transition Confirmed by Tilden Fossa [phone]) on 03/19/2020 11:39:51 AM   Radiology No results found.  Procedures Procedures (including  critical care time)  Medications Ordered in ED Medications - No data to display  ED Course  I have reviewed the triage vital signs and the nursing notes.  Pertinent labs & imaging results that were available during my care of the patient were reviewed by me and considered in my medical decision making (see chart for details).    MDM Rules/Calculators/A&P                         Patient here for evaluation following syncopal event, SVT prior to ED arrival, which was converted with adenosine by EMS. She has experienced two days of exertional chest pain. She does have persistent exertional chest pain on ED arrival although she is in sinus rhythm. EKG without acute ischemic changes. Troponin is minimally elevated. Given her cardiac history recommend admission for further evaluation. Cardiology consulted for admission.  Final Clinical Impression(s) / ED Diagnoses Final diagnoses:  SVT (supraventricular tachycardia) (HCC)  Syncope, cardiogenic  Exertional chest pain    Rx / DC Orders ED Discharge Orders    None       Tilden Fossa, MD 03/19/20 1640

## 2020-03-20 DIAGNOSIS — R55 Syncope and collapse: Secondary | ICD-10-CM

## 2020-03-20 LAB — CBC
HCT: 33.7 % — ABNORMAL LOW (ref 36.0–46.0)
Hemoglobin: 10.6 g/dL — ABNORMAL LOW (ref 12.0–15.0)
MCH: 29.3 pg (ref 26.0–34.0)
MCHC: 31.5 g/dL (ref 30.0–36.0)
MCV: 93.1 fL (ref 80.0–100.0)
Platelets: 259 10*3/uL (ref 150–400)
RBC: 3.62 MIL/uL — ABNORMAL LOW (ref 3.87–5.11)
RDW: 16.1 % — ABNORMAL HIGH (ref 11.5–15.5)
WBC: 8.4 10*3/uL (ref 4.0–10.5)
nRBC: 0 % (ref 0.0–0.2)

## 2020-03-20 LAB — LIPID PANEL
Cholesterol: 116 mg/dL (ref 0–200)
HDL: 52 mg/dL (ref >40)
LDL Cholesterol: 54 mg/dL (ref 0–99)
Total CHOL/HDL Ratio: 2.2 RATIO
Triglycerides: 52 mg/dL (ref <150)
VLDL: 10 mg/dL (ref 0–40)

## 2020-03-20 LAB — APTT
aPTT: >200 seconds (ref 24–36)
aPTT: 65 seconds — ABNORMAL HIGH (ref 24–36)

## 2020-03-20 LAB — HEPARIN LEVEL (UNFRACTIONATED): Heparin Unfractionated: >2.2 IU/mL — ABNORMAL HIGH (ref 0.30–0.70)

## 2020-03-20 LAB — BASIC METABOLIC PANEL
Anion gap: 9 (ref 5–15)
BUN: 33 mg/dL — ABNORMAL HIGH (ref 8–23)
CO2: 28 mmol/L (ref 22–32)
Calcium: 8.8 mg/dL — ABNORMAL LOW (ref 8.9–10.3)
Chloride: 104 mmol/L (ref 98–111)
Creatinine, Ser: 1.11 mg/dL — ABNORMAL HIGH (ref 0.44–1.00)
GFR calc Af Amer: 53 mL/min — ABNORMAL LOW (ref >60)
GFR calc non Af Amer: 46 mL/min — ABNORMAL LOW (ref >60)
Glucose, Bld: 114 mg/dL — ABNORMAL HIGH (ref 70–99)
Potassium: 3.9 mmol/L (ref 3.5–5.1)
Sodium: 141 mmol/L (ref 135–145)

## 2020-03-20 LAB — TROPONIN I (HIGH SENSITIVITY)
Troponin I (High Sensitivity): 27 ng/L — ABNORMAL HIGH (ref <18)
Troponin I (High Sensitivity): 22 ng/L — ABNORMAL HIGH (ref <18)

## 2020-03-20 MED ORDER — POTASSIUM CHLORIDE CRYS ER 10 MEQ PO TBCR
10.0000 meq | EXTENDED_RELEASE_TABLET | Freq: Once | ORAL | Status: AC
  Administered 2020-03-20: 10 meq via ORAL
  Filled 2020-03-20: qty 1

## 2020-03-20 MED ORDER — SODIUM CHLORIDE 0.9 % WEIGHT BASED INFUSION
3.0000 mL/kg/h | INTRAVENOUS | Status: DC
  Administered 2020-03-21: 3 mL/kg/h via INTRAVENOUS

## 2020-03-20 MED ORDER — ASPIRIN 81 MG PO CHEW
81.0000 mg | CHEWABLE_TABLET | ORAL | Status: AC
  Administered 2020-03-21: 81 mg via ORAL
  Filled 2020-03-20: qty 1

## 2020-03-20 MED ORDER — SODIUM CHLORIDE 0.9 % WEIGHT BASED INFUSION: 1.0000 mL/kg/h | INTRAVENOUS | Status: DC

## 2020-03-20 MED ORDER — SODIUM CHLORIDE 0.9% FLUSH: 3.0000 mL | INTRAVENOUS | Status: DC | PRN

## 2020-03-20 MED ORDER — SODIUM CHLORIDE 0.9 % IV SOLN: INTRAVENOUS | Status: DC

## 2020-03-20 MED ORDER — HEPARIN (PORCINE) 25000 UT/250ML-% IV SOLN
900.0000 [IU]/h | INTRAVENOUS | Status: DC
  Administered 2020-03-20: 1000 [IU]/h via INTRAVENOUS
  Filled 2020-03-20 (×2): qty 250

## 2020-03-20 MED ORDER — SODIUM CHLORIDE 0.9 % IV SOLN: 250.0000 mL | INTRAVENOUS | Status: DC | PRN

## 2020-03-20 MED ORDER — SODIUM CHLORIDE 0.9% FLUSH: 3.0000 mL | Freq: Two times a day (BID) | INTRAVENOUS | Status: DC

## 2020-03-20 MED ORDER — SODIUM CHLORIDE 0.9% FLUSH
3.0000 mL | Freq: Two times a day (BID) | INTRAVENOUS | Status: DC
  Administered 2020-03-21 (×2): 3 mL via INTRAVENOUS

## 2020-03-20 MED ORDER — MAGNESIUM SULFATE 2 GM/50ML IV SOLN
2.0000 g | Freq: Once | INTRAVENOUS | Status: AC
  Administered 2020-03-20: 2 g via INTRAVENOUS
  Filled 2020-03-20: qty 50

## 2020-03-20 NOTE — Progress Notes (Signed)
ANTICOAGULATION CONSULT NOTE  Pharmacy Consult for Heparin Indication: chest pain/ACS and atrial fibrillation  Allergies  Allergen Reactions  . Amiodarone Nausea And Vomiting  . Cymbalta [Duloxetine Hcl] Other (See Comments)    "Made me feel crazy"    Patient Measurements: Height: 5\' 4"  (162.6 cm) Weight: 90.2 kg (198 lb 13.7 oz) IBW/kg (Calculated) : 54.7 Heparin Dosing Weight: 74.6 kg  Vital Signs: Temp: 98.1 F (36.7 C) (09/26 1248) Temp Source: Oral (09/26 1248) BP: 117/72 (09/26 1248) Pulse Rate: 91 (09/26 1248)  Labs: Recent Labs    03/19/20 1133 03/19/20 1133 03/19/20 1410 03/20/20 0314 03/20/20 0403 03/20/20 0925 03/20/20 1756  HGB 11.8*  --   --  10.6*  --   --   --   HCT 38.4  --   --  33.7*  --   --   --   PLT 276  --   --  259  --   --   --   APTT  --   --   --  >200*  --   --  65*  HEPARINUNFRC  --   --   --  >2.20*  --   --   --   CREATININE 1.41*  --   --  1.11*  --   --   --   TROPONINIHS 22*   < > 25*  --  27* 22*  --    < > = values in this interval not displayed.    Estimated Creatinine Clearance: 41.8 mL/min (A) (by C-G formula based on SCr of 1.11 mg/dL (H)).   Medical History: Past Medical History:  Diagnosis Date  . Acute systolic heart failure (HCC)   . Arthritis   . CHF (congestive heart failure) (HCC)   . Coronary artery disease   . Hyperlipidemia   . PAF (paroxysmal atrial fibrillation) (HCC)   . STEMI (ST elevation myocardial infarction) (HCC)    04/04/18 PCI/DES to the pLAD, 95% dRCA (possible staged intervention), 75% mLAD, EF 45%    Medications:  Scheduled:  . [START ON 03/21/2020] aspirin  81 mg Oral Pre-Cath  . aspirin EC  81 mg Oral Daily  . [START ON 03/21/2020] atorvastatin  40 mg Oral Once per day on Mon Wed Fri  . clopidogrel  75 mg Oral Daily  . ferrous sulfate  325 mg Oral Q breakfast  . isosorbide mononitrate  30 mg Oral Daily  . metoprolol succinate  25 mg Oral Daily  . sodium chloride flush  3 mL  Intravenous Q12H  . sodium chloride flush  3 mL Intravenous Q12H  . sodium chloride flush  3 mL Intravenous Q12H    Assessment: Patient is a 45 yof that presented with chest pain and syncope. Cardiology notes brief episodes of Afib but unclear if on AC. Last dose of Apixaban was at 0900 on 9/25 (per med rec tech); some confusion if till on apixaban.  APTT is subtherapeutic at 65, on 950 units/hr. Hgb 10.6, plt 259. No s/sx of bleeding or infusion issues. Drawn appropriately.   Goal of Therapy:  aPTT 66-102 seconds Monitor platelets by anticoagulation protocol: Yes   Plan:  -Increase heparin infusion to 1000 units/hr -Will confirm aPTT with AM labs -Monitor daily HL/aPTT, CBC, and for s/sx of bleeding  10/25, PharmD, BCCCP Clinical Pharmacist  Phone: [phone] 03/20/2020 7:23 PM  Please check AMION for all [facility] phone numbers After 10:00 PM, call Main Pharmacy [phone]

## 2020-03-20 NOTE — Progress Notes (Signed)
ANTICOAGULATION CONSULT NOTE  Pharmacy Consult for Heparin Indication: chest pain/ACS and atrial fibrillation  Allergies  Allergen Reactions   Amiodarone Nausea And Vomiting   Cymbalta [Duloxetine Hcl] Other (See Comments)    "Made me feel crazy"    Patient Measurements: Height: 5\' 4"  (162.6 cm) Weight: 90.2 kg (198 lb 13.7 oz) IBW/kg (Calculated) : 54.7 Heparin Dosing Weight: 74.6 kg  Vital Signs: Temp: 97.9 F (36.6 C) (09/26 0314) Temp Source: Oral (09/26 0314) BP: 116/68 (09/26 0422) Pulse Rate: 81 (09/26 0422)  Labs: Recent Labs    03/19/20 1133 03/19/20 1410 03/20/20 0314 03/20/20 0403  HGB 11.8*  --  10.6*  --   HCT 38.4  --  33.7*  --   PLT 276  --  259  --   APTT  --   --  >200*  --   HEPARINUNFRC  --   --  >2.20*  --   CREATININE 1.41*  --  1.11*  --   TROPONINIHS 22* 25*  --  27*    Estimated Creatinine Clearance: 41.8 mL/min (A) (by C-G formula based on SCr of 1.11 mg/dL (H)).   Medical History: Past Medical History:  Diagnosis Date   Acute systolic heart failure (HCC)    Arthritis    CHF (congestive heart failure) (HCC)    Coronary artery disease    Hyperlipidemia    PAF (paroxysmal atrial fibrillation) (HCC)    STEMI (ST elevation myocardial infarction) (HCC)    04/04/18 PCI/DES to the pLAD, 95% dRCA (possible staged intervention), 75% mLAD, EF 45%    Medications:  Scheduled:   aspirin  324 mg Oral NOW   Or   aspirin  300 mg Rectal NOW   aspirin EC  81 mg Oral Daily   [START ON 03/21/2020] atorvastatin  40 mg Oral Once per day on Mon Wed Fri   clopidogrel  75 mg Oral Daily   ferrous sulfate  325 mg Oral Q breakfast   isosorbide mononitrate  30 mg Oral Daily   metoprolol succinate  25 mg Oral Daily   sodium chloride flush  3 mL Intravenous Q12H    Assessment: Patient is a 33 yof that presented with   Last dose of Apixaban was at 0900 on 9/25 (per med rec tech) some confusion if till on Apixaban   9/26 AM update:   APTT is >200 Drawn correctly from arm opposite heparin infusion No issues per RN  Goal of Therapy:  aPTT 66-102 seconds Monitor platelets by anticoagulation protocol: Yes   Plan:  -Hold heparin x 1 hr -Re-start heparin drip at 950 units/hr at 0630 -1430 heparin level  10/26, PharmD, BCPS Clinical Pharmacist Phone: [phone]

## 2020-03-20 NOTE — Progress Notes (Signed)
Pt c/o chest pressure. V/S stable. Ntg SLx1 given as PRN dose. EKG done. Dr Kathi Ludwig informed. Currently pt is chest pain free.

## 2020-03-20 NOTE — H&P (View-Only) (Signed)
 Progress Note  Patient Name: Zissy S Rachel Date of Encounter: 03/20/2020  Primary Cardiologist: Jayadeep Varanasi, MD   Subjective   No chest pain or sob.   Inpatient Medications    Scheduled Meds: . aspirin  324 mg Oral NOW   Or  . aspirin  300 mg Rectal NOW  . [START ON 03/21/2020] aspirin  81 mg Oral Pre-Cath  . aspirin EC  81 mg Oral Daily  . [START ON 03/21/2020] atorvastatin  40 mg Oral Once per day on Mon Wed Fri  . clopidogrel  75 mg Oral Daily  . ferrous sulfate  325 mg Oral Q breakfast  . isosorbide mononitrate  30 mg Oral Daily  . metoprolol succinate  25 mg Oral Daily  . potassium chloride  10 mEq Oral Once  . sodium chloride flush  3 mL Intravenous Q12H  . sodium chloride flush  3 mL Intravenous Q12H   Continuous Infusions: . sodium chloride    . sodium chloride    . [START ON 03/21/2020] sodium chloride     Followed by  . [START ON 03/21/2020] sodium chloride    . heparin 950 Units/hr (03/20/20 0630)   PRN Meds: sodium chloride, sodium chloride, acetaminophen, ALPRAZolam, meclizine, nitroGLYCERIN, ondansetron (ZOFRAN) IV, ondansetron, sodium chloride flush, sodium chloride flush, traMADol, zolpidem   Vital Signs    Vitals:   03/20/20 0344 03/20/20 0422 03/20/20 0443 03/20/20 0849  BP: 94/72 116/68  116/67  Pulse: 94 81  82  Resp:      Temp:      TempSrc:      SpO2: 91% 98%    Weight:   90.2 kg   Height:        Intake/Output Summary (Last 24 hours) at 03/20/2020 1053 Last data filed at 03/20/2020 0400 Gross per 24 hour  Intake 105.16 ml  Output --  Net 105.16 ml   Filed Weights   03/19/20 1643 03/20/20 0443  Weight: 95.3 kg 90.2 kg    Telemetry    nsr - Personally Reviewed  ECG    nsr - Personally Reviewed  Physical Exam   GEN: No acute distress.   Neck: No JVD Cardiac: RRR, no murmurs, rubs, or gallops.  Respiratory: Clear to auscultation bilaterally. GI: Soft, nontender, non-distended  MS: No edema; No deformity. Neuro:   Nonfocal  Psych: Normal affect   Labs    Chemistry Recent Labs  Lab 03/19/20 1133 03/20/20 0314  NA 141 141  K 3.8 3.9  CL 103 104  CO2 26 28  GLUCOSE 107* 114*  BUN 31* 33*  CREATININE 1.41* 1.11*  CALCIUM 8.9 8.8*  PROT 6.9  --   ALBUMIN 3.6  --   AST 21  --   ALT 17  --   ALKPHOS 63  --   BILITOT 0.8  --   GFRNONAA 34* 46*  GFRAA 40* 53*  ANIONGAP 12 9     Hematology Recent Labs  Lab 03/19/20 1133 03/20/20 0314  WBC 9.9 8.4  RBC 4.10 3.62*  HGB 11.8* 10.6*  HCT 38.4 33.7*  MCV 93.7 93.1  MCH 28.8 29.3  MCHC 30.7 31.5  RDW 16.1* 16.1*  PLT 276 259    Cardiac EnzymesNo results for input(s): TROPONINI in the last 168 hours. No results for input(s): TROPIPOC in the last 168 hours.   BNPNo results for input(s): BNP, PROBNP in the last 168 hours.   DDimer No results for input(s): DDIMER in the last 168 hours.     Radiology    CT Head Wo Contrast  Result Date: 03/19/2020 CLINICAL DATA:  Head trauma. EXAM: CT HEAD WITHOUT CONTRAST TECHNIQUE: Contiguous axial images were obtained from the base of the skull through the vertex without intravenous contrast. COMPARISON:  None. FINDINGS: Brain: No evidence of acute infarction, hemorrhage, hydrocephalus, extra-axial collection or mass lesion/mass effect. Moderate brain parenchymal volume loss and mild deep white matter microangiopathy. Vascular: Calcific atherosclerotic disease of the intra cavernous carotid arteries. Skull: Normal. Negative for fracture or focal lesion. Sinuses/Orbits: No acute finding. Other: None. IMPRESSION: 1. No acute intracranial abnormality. 2. Moderate brain parenchymal atrophy and chronic microvascular disease. Electronically Signed   By: Ted Mcalpine M.D.   On: 03/19/2020 13:05   CT Cervical Spine Wo Contrast  Result Date: 03/19/2020 CLINICAL DATA:  Neck trauma. EXAM: CT CERVICAL SPINE WITHOUT CONTRAST TECHNIQUE: Multidetector CT imaging of the cervical spine was performed without  intravenous contrast. Multiplanar CT image reconstructions were also generated. COMPARISON:  None. FINDINGS: Alignment: Levoconvex scoliosis of the cervical spine. Skull base and vertebrae: No acute fracture. No primary bone lesion or focal pathologic process. Soft tissues and spinal canal: No prevertebral fluid or swelling. No visible canal hematoma. Disc levels: Mild multilevel osteoarthritic changes in the lower cervical spine. Upper chest: Negative. Other: None. IMPRESSION: 1. No evidence of acute traumatic injury to the cervical spine. 2. Levoconvex scoliosis of the cervical spine. 3. Mild multilevel osteoarthritic changes in the lower cervical spine. Electronically Signed   By: Ted Mcalpine M.D.   On: 03/19/2020 13:01   DG Chest Port 1 View  Result Date: 03/19/2020 CLINICAL DATA:  Chest pain for 2 days.  Syncopal episode. EXAM: PORTABLE CHEST 1 VIEW COMPARISON:  March 25, 2019 FINDINGS: Cardiomediastinal silhouette is normal. Mediastinal contours appear intact. Low lung volumes with left lower lobe atelectasis versus airspace consolidation. Osseous structures are without acute abnormality. Soft tissues are grossly normal. IMPRESSION: Low lung volumes with left lower lobe atelectasis versus airspace consolidation. Electronically Signed   By: Ted Mcalpine M.D.   On: 03/19/2020 12:12    Cardiac Studies   none  Patient Profile     86 y.o. female admitted with [place] and SVT associated with syncope in the setting of known obstructive CAD.   Assessment & Plan    1. SVT - I have discussed the treatment options with the patient and the risks/benefits/goals/expectations of SVT ablation were reviewed. We will likely shedule this as an outpatient. 2. CAD - she has known tight RCA stenosis and angina. She will undergo left heart cath and possible PCI tomorrow.  3. Chronic systolic heart failure - she has class 2 symptoms.   For questions or updates, please contact [facility] Please  consult [email or web address] for contact info under Cardiology/STEMI.      Signed, Lewayne Bunting, MD  03/20/2020, 10:53 AM  Patient ID: [name], female   DOB: 21-Feb-1934, 86 y.o.   MRN: [id number]

## 2020-03-20 NOTE — Progress Notes (Signed)
Progress Note  Patient Name: [name] Date of Encounter: 03/20/2020  Primary Cardiologist: Lance Muss, MD   Subjective   No chest pain or sob.   Inpatient Medications    Scheduled Meds: . aspirin  324 mg Oral NOW   Or  . aspirin  300 mg Rectal NOW  . [START ON 03/21/2020] aspirin  81 mg Oral Pre-Cath  . aspirin EC  81 mg Oral Daily  . [START ON 03/21/2020] atorvastatin  40 mg Oral Once per day on Mon Wed Fri  . clopidogrel  75 mg Oral Daily  . ferrous sulfate  325 mg Oral Q breakfast  . isosorbide mononitrate  30 mg Oral Daily  . metoprolol succinate  25 mg Oral Daily  . potassium chloride  10 mEq Oral Once  . sodium chloride flush  3 mL Intravenous Q12H  . sodium chloride flush  3 mL Intravenous Q12H   Continuous Infusions: . sodium chloride    . sodium chloride    . [START ON 03/21/2020] sodium chloride     Followed by  . [START ON 03/21/2020] sodium chloride    . heparin 950 Units/hr (03/20/20 0630)   PRN Meds: sodium chloride, sodium chloride, acetaminophen, ALPRAZolam, meclizine, nitroGLYCERIN, ondansetron (ZOFRAN) IV, ondansetron, sodium chloride flush, sodium chloride flush, traMADol, zolpidem   Vital Signs    Vitals:   03/20/20 0344 03/20/20 0422 03/20/20 0443 03/20/20 0849  BP: 94/72 116/68  116/67  Pulse: 94 81  82  Resp:      Temp:      TempSrc:      SpO2: 91% 98%    Weight:   90.2 kg   Height:        Intake/Output Summary (Last 24 hours) at 03/20/2020 1053 Last data filed at 03/20/2020 0400 Gross per 24 hour  Intake 105.16 ml  Output --  Net 105.16 ml   Filed Weights   03/19/20 1643 03/20/20 0443  Weight: 95.3 kg 90.2 kg    Telemetry    nsr - Personally Reviewed  ECG    nsr - Personally Reviewed  Physical Exam   GEN: No acute distress.   Neck: No JVD Cardiac: RRR, no murmurs, rubs, or gallops.  Respiratory: Clear to auscultation bilaterally. GI: Soft, nontender, non-distended  MS: No edema; No deformity. Neuro:   Nonfocal  Psych: Normal affect   Labs    Chemistry Recent Labs  Lab 03/19/20 1133 03/20/20 0314  NA 141 141  K 3.8 3.9  CL 103 104  CO2 26 28  GLUCOSE 107* 114*  BUN 31* 33*  CREATININE 1.41* 1.11*  CALCIUM 8.9 8.8*  PROT 6.9  --   ALBUMIN 3.6  --   AST 21  --   ALT 17  --   ALKPHOS 63  --   BILITOT 0.8  --   GFRNONAA 34* 46*  GFRAA 40* 53*  ANIONGAP 12 9     Hematology Recent Labs  Lab 03/19/20 1133 03/20/20 0314  WBC 9.9 8.4  RBC 4.10 3.62*  HGB 11.8* 10.6*  HCT 38.4 33.7*  MCV 93.7 93.1  MCH 28.8 29.3  MCHC 30.7 31.5  RDW 16.1* 16.1*  PLT 276 259    Cardiac EnzymesNo results for input(s): TROPONINI in the last 168 hours. No results for input(s): TROPIPOC in the last 168 hours.   BNPNo results for input(s): BNP, PROBNP in the last 168 hours.   DDimer No results for input(s): DDIMER in the last 168 hours.  Radiology    CT Head Wo Contrast  Result Date: 03/19/2020 CLINICAL DATA:  Head trauma. EXAM: CT HEAD WITHOUT CONTRAST TECHNIQUE: Contiguous axial images were obtained from the base of the skull through the vertex without intravenous contrast. COMPARISON:  None. FINDINGS: Brain: No evidence of acute infarction, hemorrhage, hydrocephalus, extra-axial collection or mass lesion/mass effect. Moderate brain parenchymal volume loss and mild deep white matter microangiopathy. Vascular: Calcific atherosclerotic disease of the intra cavernous carotid arteries. Skull: Normal. Negative for fracture or focal lesion. Sinuses/Orbits: No acute finding. Other: None. IMPRESSION: 1. No acute intracranial abnormality. 2. Moderate brain parenchymal atrophy and chronic microvascular disease. Electronically Signed   By: Ted Mcalpine M.D.   On: 03/19/2020 13:05   CT Cervical Spine Wo Contrast  Result Date: 03/19/2020 CLINICAL DATA:  Neck trauma. EXAM: CT CERVICAL SPINE WITHOUT CONTRAST TECHNIQUE: Multidetector CT imaging of the cervical spine was performed without  intravenous contrast. Multiplanar CT image reconstructions were also generated. COMPARISON:  None. FINDINGS: Alignment: Levoconvex scoliosis of the cervical spine. Skull base and vertebrae: No acute fracture. No primary bone lesion or focal pathologic process. Soft tissues and spinal canal: No prevertebral fluid or swelling. No visible canal hematoma. Disc levels: Mild multilevel osteoarthritic changes in the lower cervical spine. Upper chest: Negative. Other: None. IMPRESSION: 1. No evidence of acute traumatic injury to the cervical spine. 2. Levoconvex scoliosis of the cervical spine. 3. Mild multilevel osteoarthritic changes in the lower cervical spine. Electronically Signed   By: Ted Mcalpine M.D.   On: 03/19/2020 13:01   DG Chest Port 1 View  Result Date: 03/19/2020 CLINICAL DATA:  Chest pain for 2 days.  Syncopal episode. EXAM: PORTABLE CHEST 1 VIEW COMPARISON:  March 25, 2019 FINDINGS: Cardiomediastinal silhouette is normal. Mediastinal contours appear intact. Low lung volumes with left lower lobe atelectasis versus airspace consolidation. Osseous structures are without acute abnormality. Soft tissues are grossly normal. IMPRESSION: Low lung volumes with left lower lobe atelectasis versus airspace consolidation. Electronically Signed   By: Ted Mcalpine M.D.   On: 03/19/2020 12:12    Cardiac Studies   none  Patient Profile     86 y.o. female admitted with [place] and SVT associated with syncope in the setting of known obstructive CAD.   Assessment & Plan    1. SVT - I have discussed the treatment options with the patient and the risks/benefits/goals/expectations of SVT ablation were reviewed. We will likely shedule this as an outpatient. 2. CAD - she has known tight RCA stenosis and angina. She will undergo left heart cath and possible PCI tomorrow.  3. Chronic systolic heart failure - she has class 2 symptoms.   For questions or updates, please contact [facility] Please  consult [email or web address] for contact info under Cardiology/STEMI.      Signed, Lewayne Bunting, MD  03/20/2020, 10:53 AM  Patient ID: [name], female   DOB: 21-Feb-1934, 86 y.o.   MRN: [id number]

## 2020-03-21 ENCOUNTER — Encounter (HOSPITAL_COMMUNITY)
Admission: EM | Disposition: A | Discharge status: Home Health | Payer: Self-pay | Source: Home / Self Care | Attending: Internal Medicine

## 2020-03-21 ENCOUNTER — Inpatient Hospital Stay (HOSPITAL_COMMUNITY)
Admission: EM | Disposition: A | Discharge status: Home Health | Payer: Self-pay | Source: Home / Self Care | Attending: Internal Medicine

## 2020-03-21 ENCOUNTER — Encounter (HOSPITAL_COMMUNITY): Payer: Self-pay | Admitting: Cardiology

## 2020-03-21 DIAGNOSIS — I25118 Atherosclerotic heart disease of native coronary artery with other forms of angina pectoris: Secondary | ICD-10-CM

## 2020-03-21 DIAGNOSIS — I442 Atrioventricular block, complete: Secondary | ICD-10-CM | POA: Diagnosis present

## 2020-03-21 DIAGNOSIS — R079 Chest pain, unspecified: Secondary | ICD-10-CM

## 2020-03-21 DIAGNOSIS — I252 Old myocardial infarction: Secondary | ICD-10-CM

## 2020-03-21 DIAGNOSIS — I471 Supraventricular tachycardia: Principal | ICD-10-CM

## 2020-03-21 DIAGNOSIS — I25119 Atherosclerotic heart disease of native coronary artery with unspecified angina pectoris: Secondary | ICD-10-CM

## 2020-03-21 HISTORY — PX: LEFT HEART CATH AND CORONARY ANGIOGRAPHY: CATH118249

## 2020-03-21 HISTORY — PX: SVT ABLATION: EP1225

## 2020-03-21 LAB — APTT: aPTT: 121 seconds — ABNORMAL HIGH (ref 24–36)

## 2020-03-21 LAB — CREATININE, SERUM
Creatinine, Ser: 0.88 mg/dL (ref 0.44–1.00)
GFR calc Af Amer: >60 mL/min (ref >60)
GFR calc non Af Amer: >60 mL/min (ref >60)

## 2020-03-21 LAB — BASIC METABOLIC PANEL
Anion gap: 9 (ref 5–15)
BUN: 31 mg/dL — ABNORMAL HIGH (ref 8–23)
CO2: 24 mmol/L (ref 22–32)
Calcium: 8.7 mg/dL — ABNORMAL LOW (ref 8.9–10.3)
Chloride: 105 mmol/L (ref 98–111)
Creatinine, Ser: 1.06 mg/dL — ABNORMAL HIGH (ref 0.44–1.00)
GFR calc Af Amer: 56 mL/min — ABNORMAL LOW (ref >60)
GFR calc non Af Amer: 49 mL/min — ABNORMAL LOW (ref >60)
Glucose, Bld: 121 mg/dL — ABNORMAL HIGH (ref 70–99)
Potassium: 4.6 mmol/L (ref 3.5–5.1)
Sodium: 138 mmol/L (ref 135–145)

## 2020-03-21 LAB — CBC
HCT: 34.8 % — ABNORMAL LOW (ref 36.0–46.0)
HCT: 33 % — ABNORMAL LOW (ref 36.0–46.0)
Hemoglobin: 10.8 g/dL — ABNORMAL LOW (ref 12.0–15.0)
Hemoglobin: 10.3 g/dL — ABNORMAL LOW (ref 12.0–15.0)
MCH: 29 pg (ref 26.0–34.0)
MCH: 29.5 pg (ref 26.0–34.0)
MCHC: 31 g/dL (ref 30.0–36.0)
MCHC: 31.2 g/dL (ref 30.0–36.0)
MCV: 93.3 fL (ref 80.0–100.0)
MCV: 94.6 fL (ref 80.0–100.0)
Platelets: 249 10*3/uL (ref 150–400)
Platelets: 198 10*3/uL (ref 150–400)
RBC: 3.73 MIL/uL — ABNORMAL LOW (ref 3.87–5.11)
RBC: 3.49 MIL/uL — ABNORMAL LOW (ref 3.87–5.11)
RDW: 16.2 % — ABNORMAL HIGH (ref 11.5–15.5)
RDW: 16.5 % — ABNORMAL HIGH (ref 11.5–15.5)
WBC: 9.9 10*3/uL (ref 4.0–10.5)
WBC: 9.3 10*3/uL (ref 4.0–10.5)
nRBC: 0 % (ref 0.0–0.2)
nRBC: 0 % (ref 0.0–0.2)

## 2020-03-21 LAB — HEPARIN LEVEL (UNFRACTIONATED): Heparin Unfractionated: 0.82 IU/mL — ABNORMAL HIGH (ref 0.30–0.70)

## 2020-03-21 LAB — MAGNESIUM: Magnesium: 2.4 mg/dL (ref 1.7–2.4)

## 2020-03-21 LAB — GLUCOSE, CAPILLARY: Glucose-Capillary: 95 mg/dL (ref 70–99)

## 2020-03-21 SURGERY — SVT ABLATION

## 2020-03-21 SURGERY — LEFT HEART CATH AND CORONARY ANGIOGRAPHY
Anesthesia: LOCAL

## 2020-03-21 MED ORDER — SODIUM CHLORIDE 0.9 % IV SOLN: 250.0000 mL | INTRAVENOUS | Status: DC | PRN

## 2020-03-21 MED ORDER — BUPIVACAINE HCL (PF) 0.25 % IJ SOLN
INTRAMUSCULAR | Status: AC
  Filled 2020-03-21: qty 30

## 2020-03-21 MED ORDER — APIXABAN 5 MG PO TABS: 5.0000 mg | ORAL_TABLET | Freq: Two times a day (BID) | ORAL | Status: DC

## 2020-03-21 MED ORDER — MIDAZOLAM HCL 2 MG/2ML IJ SOLN
INTRAMUSCULAR | Status: AC
  Filled 2020-03-21: qty 2

## 2020-03-21 MED ORDER — FENTANYL CITRATE (PF) 100 MCG/2ML IJ SOLN
INTRAMUSCULAR | Status: DC | PRN
Start: 2020-03-21 — End: 2020-03-21
  Administered 2020-03-21 (×2): 25 ug via INTRAVENOUS

## 2020-03-21 MED ORDER — IOHEXOL 350 MG/ML SOLN
INTRAVENOUS | Status: DC | PRN
  Administered 2020-03-21: 60 mL

## 2020-03-21 MED ORDER — LIDOCAINE HCL (PF) 1 % IJ SOLN
INTRAMUSCULAR | Status: AC
  Filled 2020-03-21: qty 30

## 2020-03-21 MED ORDER — ONDANSETRON HCL 4 MG/2ML IJ SOLN: 4.0000 mg | Freq: Four times a day (QID) | INTRAMUSCULAR | Status: DC | PRN

## 2020-03-21 MED ORDER — HEPARIN (PORCINE) IN NACL 1000-0.9 UT/500ML-% IV SOLN
INTRAVENOUS | Status: AC
  Filled 2020-03-21: qty 1000

## 2020-03-21 MED ORDER — MIDAZOLAM HCL 5 MG/5ML IJ SOLN
INTRAMUSCULAR | Status: AC
  Filled 2020-03-21: qty 5

## 2020-03-21 MED ORDER — ISOPROTERENOL HCL 0.2 MG/ML IJ SOLN
INTRAMUSCULAR | Status: AC
  Filled 2020-03-21: qty 5

## 2020-03-21 MED ORDER — MIDAZOLAM HCL 5 MG/5ML IJ SOLN
INTRAMUSCULAR | Status: DC | PRN
  Administered 2020-03-21 (×2): 1 mg via INTRAVENOUS
  Administered 2020-03-21: 2 mg via INTRAVENOUS

## 2020-03-21 MED ORDER — ACETAMINOPHEN 325 MG PO TABS
650.0000 mg | ORAL_TABLET | ORAL | Status: DC | PRN
  Administered 2020-03-21 – 2020-03-22 (×2): 650 mg via ORAL

## 2020-03-21 MED ORDER — FENTANYL CITRATE (PF) 100 MCG/2ML IJ SOLN
INTRAMUSCULAR | Status: AC
  Filled 2020-03-21: qty 2

## 2020-03-21 MED ORDER — ENOXAPARIN SODIUM 30 MG/0.3ML ~~LOC~~ SOLN: 30.0000 mg | SUBCUTANEOUS | Status: DC

## 2020-03-21 MED ORDER — CHLORHEXIDINE GLUCONATE CLOTH 2 % EX PADS
6.0000 | MEDICATED_PAD | Freq: Every day | CUTANEOUS | Status: DC
  Administered 2020-03-21: 6 via TOPICAL

## 2020-03-21 MED ORDER — SODIUM CHLORIDE 0.9% FLUSH: 3.0000 mL | Freq: Two times a day (BID) | INTRAVENOUS | Status: DC

## 2020-03-21 MED ORDER — MIDAZOLAM HCL 2 MG/2ML IJ SOLN
INTRAMUSCULAR | Status: DC | PRN
  Administered 2020-03-21 (×2): 1 mg via INTRAVENOUS

## 2020-03-21 MED ORDER — SODIUM CHLORIDE 0.9 % WEIGHT BASED INFUSION: 1.0000 mL/kg/h | INTRAVENOUS | Status: AC

## 2020-03-21 MED ORDER — VERAPAMIL HCL 2.5 MG/ML IV SOLN
INTRAVENOUS | Status: AC
  Filled 2020-03-21: qty 2

## 2020-03-21 MED ORDER — FENTANYL CITRATE (PF) 100 MCG/2ML IJ SOLN
INTRAMUSCULAR | Status: DC | PRN
Start: 2020-03-21 — End: 2020-03-21
  Administered 2020-03-21: 12.5 ug via INTRAVENOUS
  Administered 2020-03-21: 25 ug via INTRAVENOUS
  Administered 2020-03-21: 12.5 ug via INTRAVENOUS

## 2020-03-21 MED ORDER — HEPARIN SODIUM (PORCINE) 1000 UNIT/ML IJ SOLN
INTRAMUSCULAR | Status: AC
  Filled 2020-03-21: qty 1

## 2020-03-21 MED ORDER — SODIUM CHLORIDE 0.9% FLUSH
3.0000 mL | Freq: Two times a day (BID) | INTRAVENOUS | Status: DC
  Administered 2020-03-21: 3 mL via INTRAVENOUS

## 2020-03-21 MED ORDER — SODIUM CHLORIDE 0.9% FLUSH: 3.0000 mL | INTRAVENOUS | Status: DC | PRN

## 2020-03-21 MED ORDER — VERAPAMIL HCL 2.5 MG/ML IV SOLN
INTRAVENOUS | Status: DC | PRN
  Administered 2020-03-21: 10 mL via INTRA_ARTERIAL

## 2020-03-21 MED ORDER — SODIUM CHLORIDE 0.9 % IV SOLN
INTRAVENOUS | Status: DC | PRN
  Administered 2020-03-21: 4 ug/min via INTRAVENOUS

## 2020-03-21 MED ORDER — HEPARIN (PORCINE) IN NACL 1000-0.9 UT/500ML-% IV SOLN
INTRAVENOUS | Status: AC
  Filled 2020-03-21: qty 500

## 2020-03-21 MED ORDER — HEPARIN (PORCINE) IN NACL 1000-0.9 UT/500ML-% IV SOLN
INTRAVENOUS | Status: DC | PRN
  Administered 2020-03-21 (×2): 500 mL

## 2020-03-21 MED ORDER — HEPARIN SODIUM (PORCINE) 1000 UNIT/ML IJ SOLN
INTRAMUSCULAR | Status: DC | PRN
  Administered 2020-03-21: 4500 [IU] via INTRAVENOUS

## 2020-03-21 MED ORDER — ISOPROTERENOL HCL 0.2 MG/ML IJ SOLN: INTRAVENOUS | Status: DC | PRN

## 2020-03-21 MED ORDER — LIDOCAINE HCL (PF) 1 % IJ SOLN
INTRAMUSCULAR | Status: DC | PRN
  Administered 2020-03-21: 2 mL

## 2020-03-21 SURGICAL SUPPLY — 11 items
BAG SNAP BAND KOVER 36X36 (MISCELLANEOUS) ×3 IMPLANT
CATH CELSIUS THERM D CV 7F (ABLATOR) ×3 IMPLANT
CATH JOSEPH QUAD ALLRED 6F REP (CATHETERS) ×6 IMPLANT
CATH POLARIS X 2.5/5/2.5 DECAP (CATHETERS) ×3 IMPLANT
PACK EP LATEX FREE (CUSTOM PROCEDURE TRAY) ×3
PACK EP LF (CUSTOM PROCEDURE TRAY) ×1 IMPLANT
PAD PRO RADIOLUCENT 2001M-C (PAD) ×3 IMPLANT
SHEATH PINNACLE 6F 10CM (SHEATH) ×6 IMPLANT
SHEATH PINNACLE 7F 10CM (SHEATH) ×3 IMPLANT
SHEATH PINNACLE 8F 10CM (SHEATH) ×3 IMPLANT
SHIELD RADPAD SCOOP 12X17 (MISCELLANEOUS) ×3 IMPLANT

## 2020-03-21 SURGICAL SUPPLY — 11 items
CATH 5FR JL3.5 JR4 ANG PIG MP (CATHETERS) ×1 IMPLANT
DEVICE RAD COMP TR BAND LRG (VASCULAR PRODUCTS) ×1 IMPLANT
GLIDESHEATH SLEND SS 6F .021 (SHEATH) ×1 IMPLANT
GUIDEWIRE INQWIRE 1.5J.035X260 (WIRE) IMPLANT
INQWIRE 1.5J .035X260CM (WIRE) ×2
KIT HEART LEFT (KITS) ×2 IMPLANT
PACK CARDIAC CATHETERIZATION (CUSTOM PROCEDURE TRAY) ×2 IMPLANT
SHEATH PROBE COVER 6X72 (BAG) ×1 IMPLANT
TRANSDUCER W/STOPCOCK (MISCELLANEOUS) ×2 IMPLANT
TUBING CIL FLEX 10 FLL-RA (TUBING) ×2 IMPLANT
WIRE HI TORQ VERSACORE-J 145CM (WIRE) ×1 IMPLANT

## 2020-03-21 NOTE — Progress Notes (Signed)
Progress Note  Patient Name: [name] Date of Encounter: 03/21/2020  [facility] Cardiologist: Lance Muss, MD   Subjective   Doing well after her catheterization.  She is having trouble remembering to not use her right wrist and right hand post procedure.  No chest discomfort or shortness of breath at this time.  Inpatient Medications    Scheduled Meds: . apixaban  5 mg Oral BID  . aspirin EC  81 mg Oral Daily  . atorvastatin  40 mg Oral Once per day on Mon Wed Fri  . clopidogrel  75 mg Oral Daily  . ferrous sulfate  325 mg Oral Q breakfast  . isosorbide mononitrate  30 mg Oral Daily  . metoprolol succinate  25 mg Oral Daily  . sodium chloride flush  3 mL Intravenous Q12H  . sodium chloride flush  3 mL Intravenous Q12H  . sodium chloride flush  3 mL Intravenous Q12H  . sodium chloride flush  3 mL Intravenous Q12H   Continuous Infusions: . sodium chloride 250 mL (03/20/20 1638)  . sodium chloride    . sodium chloride 1 mL/kg/hr (03/21/20 0930)   PRN Meds: sodium chloride, sodium chloride, acetaminophen, ALPRAZolam, meclizine, nitroGLYCERIN, ondansetron (ZOFRAN) IV, ondansetron, sodium chloride flush, sodium chloride flush, traMADol, zolpidem   Vital Signs    Vitals:   03/21/20 0903 03/21/20 0908 03/21/20 0913 03/21/20 0918  BP: 125/70 122/81 121/67 123/69  Pulse: 77 79 76 77  Resp: 15 13 12 16   Temp:      TempSrc:      SpO2: 98% 94% 94% 99%  Weight:      Height:        Intake/Output Summary (Last 24 hours) at 03/21/2020 1057 Last data filed at 03/20/2020 2000 Gross per 24 hour  Intake 519.25 ml  Output 220 ml  Net 299.25 ml   Last 3 Weights 03/21/2020 03/20/2020 03/19/2020  Weight (lbs) 207 lb 12.5 oz 198 lb 13.7 oz 210 lb  Weight (kg) 94.248 kg 90.2 kg 95.255 kg      Telemetry    Normal sinus rhythm- Personally Reviewed  ECG    Normal sinus rhythm, prolonged PR, no ST changes- Personally Reviewed  Physical Exam   GEN: No acute  distress.   Neck: No JVD Cardiac: RRR, no murmurs, rubs, or gallops.  Respiratory: Clear to auscultation bilaterally. GI: Soft, nontender, non-distended  MS: No edema; No deformity.  TR band on right wrist Neuro:  Nonfocal  Psych: Normal affect   Labs    High Sensitivity Troponin:   Recent Labs  Lab 03/19/20 1133 03/19/20 1410 03/20/20 0403 03/20/20 0925  TROPONINIHS 22* 25* 27* 22*      Chemistry Recent Labs  Lab 03/19/20 1133 03/20/20 0314 03/21/20 0208  NA 141 141 138  K 3.8 3.9 4.6  CL 103 104 105  CO2 26 28 24   GLUCOSE 107* 114* 121*  BUN 31* 33* 31*  CREATININE 1.41* 1.11* 1.06*  CALCIUM 8.9 8.8* 8.7*  PROT 6.9  --   --   ALBUMIN 3.6  --   --   AST 21  --   --   ALT 17  --   --   ALKPHOS 63  --   --   BILITOT 0.8  --   --   GFRNONAA 34* 46* 49*  GFRAA 40* 53* 56*  ANIONGAP 12 9 9      Hematology Recent Labs  Lab 03/19/20 1133 03/20/20 0314 03/21/20 03/21/20  WBC 9.9 8.4 9.9  RBC 4.10 3.62* 3.73*  HGB 11.8* 10.6* 10.8*  HCT 38.4 33.7* 34.8*  MCV 93.7 93.1 93.3  MCH 28.8 29.3 29.0  MCHC 30.7 31.5 31.0  RDW 16.1* 16.1* 16.2*  PLT 276 259 249    BNPNo results for input(s): BNP, PROBNP in the last 168 hours.   DDimer No results for input(s): DDIMER in the last 168 hours.   Radiology    CT Head Wo Contrast  Result Date: 03/19/2020 CLINICAL DATA:  Head trauma. EXAM: CT HEAD WITHOUT CONTRAST TECHNIQUE: Contiguous axial images were obtained from the base of the skull through the vertex without intravenous contrast. COMPARISON:  None. FINDINGS: Brain: No evidence of acute infarction, hemorrhage, hydrocephalus, extra-axial collection or mass lesion/mass effect. Moderate brain parenchymal volume loss and mild deep white matter microangiopathy. Vascular: Calcific atherosclerotic disease of the intra cavernous carotid arteries. Skull: Normal. Negative for fracture or focal lesion. Sinuses/Orbits: No acute finding. Other: None. IMPRESSION: 1. No acute  intracranial abnormality. 2. Moderate brain parenchymal atrophy and chronic microvascular disease. Electronically Signed   By: Ted Mcalpine M.D.   On: 03/19/2020 13:05   CT Cervical Spine Wo Contrast  Result Date: 03/19/2020 CLINICAL DATA:  Neck trauma. EXAM: CT CERVICAL SPINE WITHOUT CONTRAST TECHNIQUE: Multidetector CT imaging of the cervical spine was performed without intravenous contrast. Multiplanar CT image reconstructions were also generated. COMPARISON:  None. FINDINGS: Alignment: Levoconvex scoliosis of the cervical spine. Skull base and vertebrae: No acute fracture. No primary bone lesion or focal pathologic process. Soft tissues and spinal canal: No prevertebral fluid or swelling. No visible canal hematoma. Disc levels: Mild multilevel osteoarthritic changes in the lower cervical spine. Upper chest: Negative. Other: None. IMPRESSION: 1. No evidence of acute traumatic injury to the cervical spine. 2. Levoconvex scoliosis of the cervical spine. 3. Mild multilevel osteoarthritic changes in the lower cervical spine. Electronically Signed   By: Ted Mcalpine M.D.   On: 03/19/2020 13:01   CARDIAC CATHETERIZATION  Result Date: 03/21/2020  Mid LM to Dist LM lesion is 30% stenosed.  Mid LAD lesion is 50% stenosed.  Previously placed Ost LAD to Prox LAD drug eluting stent is widely patent.  Balloon angioplasty was performed.  Dist RCA lesion is 95% stenosed.  LV end diastolic pressure is normal.  1. Severe single vessel obstructive CAD    - the stent in the ostial/proximal LAD is widely patent.    - 95% mid RCA at high bifurcation of a large PDA and PLOM. [place] class 1,1,1. There is moderate tortuosity of the RCA 2. Normal LVEDP Plan: Reviewed films with Dr Eldridge Dace. The RCA stenosis is unchanged. It is a complex bifurcation stenosis. It would be difficult to treat percutaneously without potential compromise of a large branch. Recommend continued medical therapy. In the future would  consider alternate access other than the right radial artery given its small size.   DG Chest Port 1 View  Result Date: 03/19/2020 CLINICAL DATA:  Chest pain for 2 days.  Syncopal episode. EXAM: PORTABLE CHEST 1 VIEW COMPARISON:  March 25, 2019 FINDINGS: Cardiomediastinal silhouette is normal. Mediastinal contours appear intact. Low lung volumes with left lower lobe atelectasis versus airspace consolidation. Osseous structures are without acute abnormality. Soft tissues are grossly normal. IMPRESSION: Low lung volumes with left lower lobe atelectasis versus airspace consolidation. Electronically Signed   By: Ted Mcalpine M.D.   On: 03/19/2020 12:12    Cardiac Studies   I personally reviewed her cardiac cath results  from today, and compared them to her 2019 cath films  Patient Profile     86 y.o. female with prior anterior MI, now diagnosed with SVT and syncope  Assessment & Plan    1) CAD/old MI: Widely patent LAD stent from 2019.  She does have significant RCA disease but this actually appears slightly improved from prior catheterization.  It is at a bifurcation of 2 large branches and PCI would carry a high risk of periprocedural MI from jailing one of the branches.  Given that this disease has not worsened over the course of more than 2 years, and looks slightly improved, no PCI was performed.  I discussed the case with Dr. [place] who performed the catheterization.  We will continue medical therapy.  Only minimal troponin elevation, which was not in the pattern of an ACS.  2) syncope/SVT: I suspect her syncope was related to her high heart rate.  Per Dr. Lubertha Basque note on Saturday, if no intervention was required, plan would be for SVT ablation.  We will have EP check on the patient today as well to come up with a plan.  I spoke to the daughter Burnetta Sabin) at length, who was not happy at all.  I explained the cath results at length.  THe mid LAD lesion that was mentioned in 2019 distal  to the stent has resolved.  The RCA lesion noted above has improved.  She now understands the rationale for no plan for PCI.      Burnetta Sabin describes that the patient has had presyncope with exertion on several occasions.  Given the syncopal episode in the setting of SVT.   THe patient has had difficulty tolerating multiple meds in the past, especially amiodarone for AFib post MI.  THis is well documented from 2019 records.  Will await recs from EP.  Burnetta Sabin is hopeful that the ablation would be done as inpatient rather than outpatient.   For questions or updates, please contact [facility] Please consult [email or web address] for contact info under        Signed, Lance Muss, MD  03/21/2020, 10:57 AM

## 2020-03-21 NOTE — Progress Notes (Signed)
Physical Therapy Evaluation Patient Details Name: [name] MRN: [id number] DOB: Jun 08, 1934 Today's Date: 03/21/2020   History of Present Illness  86 y.o. female with pmh of CAD PCI/DES to pLAD and staged RCA (95% stenosis), ischemic cardiomyopathy with (EF 40%, but as low as 30%), brief episodes of paroxysmal afib not on a/c, chronic systolic CHF, prior thrombus, CKD who presents with chest pain and syncope. In ED 100/85, pulse 96, RR 21, 97% O2. CXR showed low lung volumes with left lower lobe atelectasis vs airspace consolidation.EKG showed NSR 99bpm, 1st degree AV block, TWI aVL, and minimal STE inf leads.    Clinical Impression  PTA pt living with husband in multilevel home with bed and bath on main level and 2 steps to enter. Pt reports independence without AD prior to hospitalization, 1st onset of symptoms while grocery shopping. Pt is limited in safe mobility by diastolic BP drop with positional change (see below) with onset of symptoms of being "woozy" and having chest tightness, in presence of generalized weakness. Pt is mod A for bed mobility and min guard for transfers and ambulation of 16 feet with RW. PT recommending d/c home with HHPT when medically stable. PT will continue to follow acutely.  Orthostatic BPs                                                              BP                     HR Sitting 115/81 88  Sitting after 3 min 122/63 87  After ambulation  121/60 109  Supine  128/82 87       Follow Up Recommendations Home health PT;Supervision/Assistance - 24 hour    Equipment Recommendations  None recommended by PT (has necessary DME)    Recommendations for Other Services OT consult     Precautions / Restrictions Precautions Precautions: Fall Precaution Comments: sycopal episode prior to admission  Restrictions Weight Bearing Restrictions: No      Mobility  Bed Mobility Overal bed mobility: Needs Assistance Bed Mobility: Supine to Sit;Sit to Supine      Supine to sit: Mod assist Sit to supine: Min guard   General bed mobility comments: pt able to manage LE off bed, however requires modA for bringing trunk to upright  Transfers Overall transfer level: Needs assistance Equipment used: None Transfers: Sit to/from [employer] Sit to Stand: Min guard Stand pivot transfers: Min guard       General transfer comment: min guard for stand pivot to [facility] and for power up from [facility] with use of arm rests  Ambulation/Gait Ambulation/Gait assistance: Min guard Gait Distance (Feet): 16 Feet Assistive device: Rolling walker (2 wheeled) Gait Pattern/deviations: Step-through pattern;Decreased step length - right;Decreased step length - left;Trunk flexed;Wide base of support Gait velocity: slowed   General Gait Details: pt initially stated she would ambulate without AD, however reconsiders given her "woozy" feeling, use RW to ambulate to door and back , vc for proximity to RW and management around obstacles        Balance Overall balance assessment: Needs assistance Sitting-balance support: Feet supported;Bilateral upper extremity supported;No upper extremity supported;Single extremity supported Sitting balance-Leahy Scale: Poor Sitting balance - Comments: initially requires min A for steadying  due to "woozy" feeling able to progress to supervision    Standing balance support: No upper extremity supported;Bilateral upper extremity supported;Single extremity supported Standing balance-Leahy Scale: Fair Standing balance comment: able to perform self pericare in standing without UE support                             Pertinent Vitals/Pain Pain Assessment: No/denies pain (c/o of chest tightness and funny feeling with ambulation )    Home Living Family/patient expects to be discharged to:: Private residence Living Arrangements: Spouse/significant other Available Help at Discharge: Family;Available 24 hours/day Type  of Home: House Home Access: Stairs to enter Entrance Stairs-Rails: None Entrance Stairs-Number of Steps: 2 Home Layout: Two level;Able to live on main level with bedroom/bathroom (stair lift to basement) Home Equipment: Shower seat;Walker - standard;Walker - 4 wheels;Walker - 2 wheels;Bedside commode Additional Comments: uses garden     Prior Function Level of Independence: Independent         Comments: has RW for outside use, uses office chair on wheels that she uses as      Hand Dominance   Dominant Hand: Right    Extremity/Trunk Assessment   Upper Extremity Assessment Upper Extremity Assessment: Generalized weakness    Lower Extremity Assessment Lower Extremity Assessment: Generalized weakness    Cervical / Trunk Assessment Cervical / Trunk Assessment: Kyphotic  Communication   Communication: No difficulties  Cognition Arousal/Alertness: Awake/alert Behavior During Therapy: WFL for tasks assessed/performed Overall Cognitive Status: Within Functional Limits for tasks assessed                                        General Comments General comments (skin integrity, edema, etc.): In supine pt reports feeling wonderful, with positional change to sitting describes wooziness, with ambulation report chest tightness and feeling she had prior to syncopal episode, Pt on 2L O2 via [place] on entry with SaAO2 96%O2, able to maintain SaO2 >92%O2 on RA throughout session         Assessment/Plan    PT Assessment Patient needs continued PT services  PT Problem List Decreased strength;Decreased activity tolerance;Decreased balance;Decreased mobility;Cardiopulmonary status limiting activity       PT Treatment Interventions DME instruction;Gait training;Stair training;Functional mobility training;Therapeutic activities;Therapeutic exercise;Balance training;Patient/family education    PT Goals (Current goals can be found in the Care Plan section)  Acute Rehab PT  Goals Patient Stated Goal: get straightened out PT Goal Formulation: With patient Time For Goal Achievement: 04/04/20 Potential to Achieve Goals: Good    Frequency Min 3X/week    AM-PAC PT "6 Clicks" Mobility  Outcome Measure Help needed turning from your back to your side while in a flat bed without using bedrails?: None Help needed moving from lying on your back to sitting on the side of a flat bed without using bedrails?: A Lot Help needed moving to and from a bed to a chair (including a wheelchair)?: A Little Help needed standing up from a chair using your arms (e.g., wheelchair or bedside chair)?: A Little Help needed to walk in hospital room?: A Little Help needed climbing 3-5 steps with a railing? : A Lot 6 Click Score: 17    End of Session Equipment Utilized During Treatment: Gait belt Activity Tolerance: Treatment limited secondary to medical complications (Comment) (onset of symptoms ) Patient left: in bed;Other (comment) (transport  to take to Cath lab) Nurse Communication: Mobility status;Other (comment) (symptom onset with mobility) PT Visit Diagnosis: Unsteadiness on feet (R26.81);Other abnormalities of gait and mobility (R26.89);Muscle weakness (generalized) (M62.81);Difficulty in walking, not elsewhere classified (R26.2);Dizziness and giddiness (R42)    Time: 5009-3818 PT Time Calculation (min) (ACUTE ONLY): 25 min   Charges:   PT Evaluation $PT Eval Moderate Complexity: 1 Mod PT Treatments $Therapeutic Activity: 8-22 mins        [name] B. Beverely Risen PT, DPT Acute Rehabilitation Services Pager [phone] Office [phone]   Elon Alas Fleet 03/21/2020, 8:45 AM

## 2020-03-21 NOTE — Progress Notes (Signed)
ANTICOAGULATION CONSULT NOTE  Pharmacy Consult for Heparin Indication: chest pain/ACS and atrial fibrillation  Allergies  Allergen Reactions  . Amiodarone Nausea And Vomiting  . Cymbalta [Duloxetine Hcl] Other (See Comments)    "Made me feel crazy"    Patient Measurements: Height: 5\' 4"  (162.6 cm) Weight: 94.2 kg (207 lb 12.5 oz) IBW/kg (Calculated) : 54.7 Heparin Dosing Weight: 74.6 kg  Vital Signs: Temp: 98.1 F (36.7 C) (09/27 0418) Temp Source: Oral (09/27 0418) BP: 106/64 (09/27 0418) Pulse Rate: 82 (09/27 0418)  Labs: Recent Labs    03/19/20 1133 03/19/20 1133 03/19/20 1410 03/20/20 0314 03/20/20 0403 03/20/20 0925 03/20/20 1756 03/21/20 0208  HGB 11.8*   < >  --  10.6*  --   --   --  10.8*  HCT 38.4  --   --  33.7*  --   --   --  34.8*  PLT 276  --   --  259  --   --   --  249  APTT  --   --   --  >200*  --   --  65* 121*  HEPARINUNFRC  --   --   --  >2.20*  --   --   --  0.82*  CREATININE 1.41*  --   --  1.11*  --   --   --  1.06*  TROPONINIHS 22*   < > 25*  --  27* 22*  --   --    < > = values in this interval not displayed.    Estimated Creatinine Clearance: 44.8 mL/min (A) (by C-G formula based on SCr of 1.06 mg/dL (H)).   Medical History: Past Medical History:  Diagnosis Date  . Acute systolic heart failure (HCC)   . Arthritis   . CHF (congestive heart failure) (HCC)   . Coronary artery disease   . Hyperlipidemia   . PAF (paroxysmal atrial fibrillation) (HCC)   . STEMI (ST elevation myocardial infarction) (HCC)    04/04/18 PCI/DES to the pLAD, 95% dRCA (possible staged intervention), 75% mLAD, EF 45%    Medications:  Scheduled:  . aspirin EC  81 mg Oral Daily  . atorvastatin  40 mg Oral Once per day on Mon Wed Fri  . clopidogrel  75 mg Oral Daily  . ferrous sulfate  325 mg Oral Q breakfast  . isosorbide mononitrate  30 mg Oral Daily  . metoprolol succinate  25 mg Oral Daily  . sodium chloride flush  3 mL Intravenous Q12H  . sodium  chloride flush  3 mL Intravenous Q12H  . sodium chloride flush  3 mL Intravenous Q12H    Assessment: Patient is a 65 yof that presented with chest pain and syncope. Cardiology notes brief episodes of Afib but unclear if on AC. Last dose of Apixaban was at 0900 on 9/25 (per med rec tech); some confusion if till on apixaban. Plans for cath today -aPTT= 121   Goal of Therapy:  aPTT 66-102 seconds Monitor platelets by anticoagulation protocol: Yes   Plan:  -Decrease heparin infusion to 9000 units/hr -Will follow plans post cath  10/25, PharmD Clinical Pharmacist **Pharmacist phone directory can now be found on [email or web address] ([email or web address]).  Listed under [facility].

## 2020-03-21 NOTE — Telephone Encounter (Signed)
Patient is currently in the hospital and had cath done. Plan for SVT ablation now.

## 2020-03-21 NOTE — Progress Notes (Signed)
Called to Verify with Dr. Deforest Hoyles that plavix will be held tonight.

## 2020-03-21 NOTE — Interval H&P Note (Signed)
History and Physical Interval Note:  03/21/2020 8:55 AM  [name]  has presented today for surgery, with the diagnosis of unstable angina.  The various methods of treatment have been discussed with the patient and family. After consideration of risks, benefits and other options for treatment, the patient has consented to  Procedure(s): LEFT HEART CATH AND CORONARY ANGIOGRAPHY (N/A) as a surgical intervention.  The patient's history has been reviewed, patient examined, no change in status, stable for surgery.  I have reviewed the patient's chart and labs.  Questions were answered to the patient's satisfaction.   Cath Lab Visit (complete for each Cath Lab visit)  Clinical Evaluation Leading to the Procedure:   ACS: Yes.    Non-ACS:    Anginal Classification: CCS IV  Anti-ischemic medical therapy: Maximal Therapy (2 or more classes of medications)  Non-Invasive Test Results: No non-invasive testing performed  Prior CABG: No previous CABG        Theron Arista [facility] 03/21/2020 8:55 AM

## 2020-03-21 NOTE — Progress Notes (Addendum)
Progress Note  Patient Name: [name] Date of Encounter: 03/21/2020  [facility] Cardiologist: Lance Muss, MD   Subjective   I feel OK  Inpatient Medications    Scheduled Meds: . apixaban  5 mg Oral BID  . aspirin EC  81 mg Oral Daily  . atorvastatin  40 mg Oral Once per day on Mon Wed Fri  . clopidogrel  75 mg Oral Daily  . ferrous sulfate  325 mg Oral Q breakfast  . isosorbide mononitrate  30 mg Oral Daily  . metoprolol succinate  25 mg Oral Daily  . sodium chloride flush  3 mL Intravenous Q12H  . sodium chloride flush  3 mL Intravenous Q12H  . sodium chloride flush  3 mL Intravenous Q12H  . sodium chloride flush  3 mL Intravenous Q12H   Continuous Infusions: . sodium chloride 250 mL (03/20/20 1638)  . sodium chloride    . sodium chloride 1 mL/kg/hr (03/21/20 0930)   PRN Meds: sodium chloride, sodium chloride, acetaminophen, ALPRAZolam, meclizine, nitroGLYCERIN, ondansetron (ZOFRAN) IV, ondansetron, sodium chloride flush, sodium chloride flush, traMADol, zolpidem   Vital Signs    Vitals:   03/21/20 0903 03/21/20 0908 03/21/20 0913 03/21/20 0918  BP: 125/70 122/81 121/67 123/69  Pulse: 77 79 76 77  Resp: 15 13 12 16   Temp:      TempSrc:      SpO2: 98% 94% 94% 99%  Weight:      Height:        Intake/Output Summary (Last 24 hours) at 03/21/2020 1104 Last data filed at 03/20/2020 2000 Gross per 24 hour  Intake 519.25 ml  Output 220 ml  Net 299.25 ml   Last 3 Weights 03/21/2020 03/20/2020 03/19/2020  Weight (lbs) 207 lb 12.5 oz 198 lb 13.7 oz 210 lb  Weight (kg) 94.248 kg 90.2 kg 95.255 kg      Telemetry    SR 90's with long 1st degree, she also has faster rhythms, less certain, this may be ST w/1st degree vs slower SVT? AT? Low 100's - Personally Reviewed  ECG    SR, generally 70's - Personally Reviewed  Physical Exam   GEN: No acute distress.   Neck: No JVD Cardiac: RRR, no murmurs, rubs, or gallops.  Respiratory: CTA b/l. GI:  Soft, nontender, non-distended  MS: No edema; No deformity. Neuro:  Nonfocal  Psych: Normal affect   Labs    High Sensitivity Troponin:   Recent Labs  Lab 03/19/20 1133 03/19/20 1410 03/20/20 0403 03/20/20 0925  TROPONINIHS 22* 25* 27* 22*      Chemistry Recent Labs  Lab 03/19/20 1133 03/20/20 0314 03/21/20 0208  NA 141 141 138  K 3.8 3.9 4.6  CL 103 104 105  CO2 26 28 24   GLUCOSE 107* 114* 121*  BUN 31* 33* 31*  CREATININE 1.41* 1.11* 1.06*  CALCIUM 8.9 8.8* 8.7*  PROT 6.9  --   --   ALBUMIN 3.6  --   --   AST 21  --   --   ALT 17  --   --   ALKPHOS 63  --   --   BILITOT 0.8  --   --   GFRNONAA 34* 46* 49*  GFRAA 40* 53* 56*  ANIONGAP 12 9 9      Hematology Recent Labs  Lab 03/19/20 1133 03/20/20 0314 03/21/20 0208  WBC 9.9 8.4 9.9  RBC 4.10 3.62* 3.73*  HGB 11.8* 10.6* 10.8*  HCT 38.4 33.7* 34.8*  MCV 93.7 93.1 93.3  MCH 28.8 29.3 29.0  MCHC 30.7 31.5 31.0  RDW 16.1* 16.1* 16.2*  PLT 276 259 249    BNPNo results for input(s): BNP, PROBNP in the last 168 hours.   DDimer No results for input(s): DDIMER in the last 168 hours.   Radiology    CT Head Wo Contrast Result Date: 03/19/2020 CLINICAL DATA:  Head trauma. EXAM: CT HEAD WITHOUT CONTRAST TECHNIQUE: Contiguous axial images were obtained from the base of the skull through the vertex without intravenous contrast. COMPARISON:  None. FINDINGS: Brain: No evidence of acute infarction, hemorrhage, hydrocephalus, extra-axial collection or mass lesion/mass effect. Moderate brain parenchymal volume loss and mild deep white matter microangiopathy. Vascular: Calcific atherosclerotic disease of the intra cavernous carotid arteries. Skull: Normal. Negative for fracture or focal lesion. Sinuses/Orbits: No acute finding. Other: None. IMPRESSION: 1. No acute intracranial abnormality. 2. Moderate brain parenchymal atrophy and chronic microvascular disease. Electronically Signed   By: Ted Mcalpine M.D.   On:  03/19/2020 13:05     CT Cervical Spine Wo Contrast Result Date: 03/19/2020 CLINICAL DATA:  Neck trauma. EXAM: CT CERVICAL SPINE WITHOUT CONTRAST TECHNIQUE: Multidetector CT imaging of the cervical spine was performed without intravenous contrast. Multiplanar CT image reconstructions were also generated. COMPARISON:  None. FINDINGS: Alignment: Levoconvex scoliosis of the cervical spine. Skull base and vertebrae: No acute fracture. No primary bone lesion or focal pathologic process. Soft tissues and spinal canal: No prevertebral fluid or swelling. No visible canal hematoma. Disc levels: Mild multilevel osteoarthritic changes in the lower cervical spine. Upper chest: Negative. Other: None. IMPRESSION: 1. No evidence of acute traumatic injury to the cervical spine. 2. Levoconvex scoliosis of the cervical spine. 3. Mild multilevel osteoarthritic changes in the lower cervical spine. Electronically Signed   By: Ted Mcalpine M.D.   On: 03/19/2020 13:01    DG Chest Port 1 View Result Date: 03/19/2020 CLINICAL DATA:  Chest pain for 2 days.  Syncopal episode. EXAM: PORTABLE CHEST 1 VIEW COMPARISON:  March 25, 2019 FINDINGS: Cardiomediastinal silhouette is normal. Mediastinal contours appear intact. Low lung volumes with left lower lobe atelectasis versus airspace consolidation. Osseous structures are without acute abnormality. Soft tissues are grossly normal. IMPRESSION: Low lung volumes with left lower lobe atelectasis versus airspace consolidation. Electronically Signed   By: Ted Mcalpine M.D.   On: 03/19/2020 12:12    Cardiac Studies    03/21/2020: LHC  Mid LM to Dist LM lesion is 30% stenosed.  Mid LAD lesion is 50% stenosed.  Previously placed Ost LAD to Prox LAD drug eluting stent is widely patent.  Balloon angioplasty was performed.  Dist RCA lesion is 95% stenosed.  LV end diastolic pressure is normal.   1. Severe single vessel obstructive CAD    - the stent in the  ostial/proximal LAD is widely patent.     - 95% mid RCA at high bifurcation of a large PDA and PLOM. Medina class 1,1,1. There is moderate tortuosity of the RCA 2. Normal LVEDP  Plan: Reviewed films with Dr Eldridge Dace. The RCA stenosis is unchanged. It is a complex bifurcation stenosis. It would be difficult to treat percutaneously without potential compromise of a large branch. Recommend continued medical therapy. In the future would consider alternate access other than the right radial artery given its small size.     01/12/2020: TTE IMPRESSIONS  1. Left ventricular ejection fraction, by estimation, is 35 to 40%. Left  ventricular ejection fraction by 3D volume is 39 %.  The left ventricle has  moderately decreased function. The left ventricle demonstrates regional  wall motion abnormalities (see  scoring diagram/findings for description). Left ventricular diastolic  parameters are consistent with Grade I diastolic dysfunction (impaired  relaxation).  2. Right ventricular systolic function is normal. The right ventricular  size is normal. Tricuspid regurgitation signal is inadequate for assessing  PA pressure.  3. The mitral valve is normal in structure. Trivial mitral valve  regurgitation.  4. The aortic valve was not well visualized. Aortic valve regurgitation  is mild. No aortic stenosis is present.    Patient Profile     86 y.o. female w/PMHx of AFib (paroxysmal), CAD (with prior STEMI, PCI), HLD, ICM (w/hx of LV thrombus), chronic CHF (systolic)  Admitted to [facility]19/[facility] w/CP intermittently for a couple days, using PRN s/l NTG, and the morning of her admission a syncopal event while seated, EMS was called found her in an SVT w/HR 190/s and hypotensive w/SBP reported 80's, treated with 6 > 12 > 12 mg adenosine with conversion to SR. She was admitted for w/u and management   AAD hx Amiodarone started Nov 2019 for PAF >> stopped fairly quickly with GI issues (it  seems)  Assessment & Plan    1. CP      Cath today, no , (report above) "RCA stenosis is unchanged. It is a complex bifurcation stenosis. It would be difficult to treat percutaneously without potential compromise of a large branch. Recommend continued medical therapy"       C/w Dr. Eldridge Dace  2. SVT     A new diagnosis for her     Responded to adenosine     Seen by Dr. Ladona Ridgel this weekend with thought to pursue EPS/ablation in a couple weeks      She has some baseline conduction disease with 1st degree AVBlock. And some Mobitz one type behavior on telemetry. She has both Toprol (1/2 of 25mg  tab daily) and lopressor 25mg  dily on her list.  She is taking 1 pill (not sure which) the whole tab daily) Here getting Toprol 25mg  daily Historically intolerant of amio 2/2 GI Not 1c agent candidate   She would like to proceed ASAP with EPS/ablation procedure.  Her daughter has noted even with minimal exertion her HR gets 120's and makes her Mom feel uncomfortable She suspects her chest pressure on./off this summer has been her tachycardia afterall.  She is NPO, will d/w Dr. , he will see her later today  3. AFib (paroxysmal)     CHA2DS2Vasc is 7, on Eliquis outpt     None noted while here   4. ICM 5. Chronic CHF (systolic), LVEF 35-40%     Not felt to be volume OL currently      c/w Dr.   For questions or updates, please contact [facility] Please consult [email or web address] for contact info under     Signed, , PA-C  03/21/2020, 11:04 AM    EP Attending  Patient seen and examined. Agree with above. I have discussed with Dr. Eldridge Dace. The patient was admitted with syncope and SVT and has undergone left heart cath with unfavorable anatomy for PCI. She has RCA disease. I have reviewed the indications/risks/benefits/goals/expectations and she wishes to proceed.   Sheilah Pigeon Kashlynn Kundert,MD

## 2020-03-21 NOTE — Progress Notes (Signed)
TR BAND REMOVAL  LOCATION:    right radial  DEFLATED PER PROTOCOL:    Yes.    TIME BAND OFF / DRESSING APPLIED:    1310   SITE UPON ARRIVAL:    Level 0  SITE AFTER BAND REMOVAL:    Level 0  CIRCULATION SENSATION AND MOVEMENT:    Within Normal Limits   Yes.    COMMENTS:   Rechecked band per post cath orders at 1340 with no change in assessment. Dressing dry and intact, radial pulse +2, CSM's unchanged

## 2020-03-21 NOTE — Progress Notes (Signed)
Site area: rt groin fv sheaths x3 Site Prior to Removal:  Level 0 Pressure Applied For: 15 minutes Manual:   yes Patient Status During Pull:  stable Post Pull Site:  Level 0 Post Pull Instructions Given:  yes Post Pull Pulses Present: rt dp palpable Dressing Applied:  Gauze and tegaderm Bedrest begins @ 1850 Comments: IV saline locked   

## 2020-03-22 ENCOUNTER — Encounter (HOSPITAL_COMMUNITY)
Admission: EM | Disposition: A | Discharge status: Home Health | Payer: Self-pay | Source: Home / Self Care | Attending: Internal Medicine

## 2020-03-22 ENCOUNTER — Inpatient Hospital Stay (HOSPITAL_COMMUNITY): Payer: Medicare HMO

## 2020-03-22 ENCOUNTER — Encounter (HOSPITAL_COMMUNITY): Payer: Self-pay | Admitting: Internal Medicine

## 2020-03-22 ENCOUNTER — Other Ambulatory Visit: Payer: Self-pay

## 2020-03-22 DIAGNOSIS — I442 Atrioventricular block, complete: Secondary | ICD-10-CM

## 2020-03-22 HISTORY — PX: PACEMAKER IMPLANT: EP1218

## 2020-03-22 LAB — BASIC METABOLIC PANEL
Anion gap: 8 (ref 5–15)
BUN: 26 mg/dL — ABNORMAL HIGH (ref 8–23)
CO2: 24 mmol/L (ref 22–32)
Calcium: 8.8 mg/dL — ABNORMAL LOW (ref 8.9–10.3)
Chloride: 107 mmol/L (ref 98–111)
Creatinine, Ser: 0.9 mg/dL (ref 0.44–1.00)
GFR calc Af Amer: >60 mL/min (ref >60)
GFR calc non Af Amer: 59 mL/min — ABNORMAL LOW (ref >60)
Glucose, Bld: 108 mg/dL — ABNORMAL HIGH (ref 70–99)
Potassium: 4.9 mmol/L (ref 3.5–5.1)
Sodium: 139 mmol/L (ref 135–145)

## 2020-03-22 LAB — SURGICAL PCR SCREEN
MRSA, PCR: NEGATIVE
Staphylococcus aureus: NEGATIVE

## 2020-03-22 LAB — CBC
HCT: 31.6 % — ABNORMAL LOW (ref 36.0–46.0)
Hemoglobin: 9.5 g/dL — ABNORMAL LOW (ref 12.0–15.0)
MCH: 28.5 pg (ref 26.0–34.0)
MCHC: 30.1 g/dL (ref 30.0–36.0)
MCV: 94.9 fL (ref 80.0–100.0)
Platelets: 213 10*3/uL (ref 150–400)
RBC: 3.33 MIL/uL — ABNORMAL LOW (ref 3.87–5.11)
RDW: 16.3 % — ABNORMAL HIGH (ref 11.5–15.5)
WBC: 7.8 10*3/uL (ref 4.0–10.5)
nRBC: 0 % (ref 0.0–0.2)

## 2020-03-22 LAB — MRSA PCR SCREENING: MRSA by PCR: NEGATIVE

## 2020-03-22 LAB — APTT: aPTT: 36 seconds (ref 24–36)

## 2020-03-22 SURGERY — PACEMAKER IMPLANT

## 2020-03-22 MED ORDER — MUPIROCIN 2 % EX OINT: 1.0000 "application " | TOPICAL_OINTMENT | Freq: Two times a day (BID) | CUTANEOUS | Status: DC

## 2020-03-22 MED ORDER — LIDOCAINE HCL (PF) 1 % IJ SOLN
INTRAMUSCULAR | Status: AC
  Filled 2020-03-22: qty 60

## 2020-03-22 MED ORDER — LIDOCAINE HCL (PF) 1 % IJ SOLN
INTRAMUSCULAR | Status: DC | PRN
  Administered 2020-03-22: 50 mL

## 2020-03-22 MED ORDER — SODIUM CHLORIDE 0.9 % IV SOLN: INTRAVENOUS | Status: DC

## 2020-03-22 MED ORDER — CHLORHEXIDINE GLUCONATE 4 % EX LIQD
4.0000 "application " | Freq: Once | CUTANEOUS | Status: AC
  Administered 2020-03-22: 4 via TOPICAL
  Filled 2020-03-22: qty 60

## 2020-03-22 MED ORDER — METOPROLOL SUCCINATE ER 25 MG PO TB24: 25.0000 mg | ORAL_TABLET | Freq: Every day | ORAL | 6 refills | Status: DC

## 2020-03-22 MED ORDER — ONDANSETRON HCL 4 MG/2ML IJ SOLN: 4.0000 mg | Freq: Four times a day (QID) | INTRAMUSCULAR | Status: DC | PRN

## 2020-03-22 MED ORDER — ACETAMINOPHEN 325 MG PO TABS
325.0000 mg | ORAL_TABLET | ORAL | Status: DC | PRN
  Administered 2020-03-22: 650 mg via ORAL
  Filled 2020-03-22: qty 2

## 2020-03-22 MED ORDER — FENTANYL CITRATE (PF) 100 MCG/2ML IJ SOLN
INTRAMUSCULAR | Status: AC
  Filled 2020-03-22: qty 2

## 2020-03-22 MED ORDER — CEFAZOLIN SODIUM-DEXTROSE 2-4 GM/100ML-% IV SOLN
2.0000 g | INTRAVENOUS | Status: AC
  Administered 2020-03-22: 2 g via INTRAVENOUS
  Filled 2020-03-22 (×2): qty 100

## 2020-03-22 MED ORDER — FENTANYL CITRATE (PF) 100 MCG/2ML IJ SOLN
INTRAMUSCULAR | Status: DC | PRN
Start: 2020-03-22 — End: 2020-03-22
  Administered 2020-03-22 (×3): 12.5 ug via INTRAVENOUS

## 2020-03-22 MED ORDER — CEFAZOLIN SODIUM-DEXTROSE 2-4 GM/100ML-% IV SOLN
INTRAVENOUS | Status: AC
  Filled 2020-03-22: qty 100

## 2020-03-22 MED ORDER — MIDAZOLAM HCL 5 MG/5ML IJ SOLN
INTRAMUSCULAR | Status: AC
  Filled 2020-03-22: qty 5

## 2020-03-22 MED ORDER — SODIUM CHLORIDE 0.9 % IV SOLN
80.0000 mg | INTRAVENOUS | Status: AC
  Administered 2020-03-22: 80 mg
  Filled 2020-03-22: qty 2

## 2020-03-22 MED ORDER — CEFAZOLIN SODIUM-DEXTROSE 1-4 GM/50ML-% IV SOLN
1.0000 g | Freq: Four times a day (QID) | INTRAVENOUS | Status: DC
  Administered 2020-03-22 (×2): 1 g via INTRAVENOUS
  Filled 2020-03-22 (×3): qty 50

## 2020-03-22 MED ORDER — MIDAZOLAM HCL 5 MG/5ML IJ SOLN
INTRAMUSCULAR | Status: DC | PRN
  Administered 2020-03-22 (×3): 1 mg via INTRAVENOUS

## 2020-03-22 MED ORDER — SODIUM CHLORIDE 0.9 % IV SOLN
INTRAVENOUS | Status: AC
  Filled 2020-03-22: qty 2

## 2020-03-22 MED ORDER — HEPARIN (PORCINE) IN NACL 1000-0.9 UT/500ML-% IV SOLN
INTRAVENOUS | Status: DC | PRN
  Administered 2020-03-22: 500 mL

## 2020-03-22 MED FILL — Bupivacaine HCl Preservative Free (PF) Inj 0.25%: INTRAMUSCULAR | Qty: 30 | Status: AC

## 2020-03-22 SURGICAL SUPPLY — 12 items
CABLE SURGICAL S-101-97-12 (CABLE) ×3 IMPLANT
CATH RIGHTSITE C315HIS02 (CATHETERS) ×3 IMPLANT
LEAD SELECT SECURE 3830 383069 (Lead) ×1 IMPLANT
LEAD SOLIA S PRO MRI 53 (Lead) ×3 IMPLANT
MAT PREVALON FULL STRYKER (MISCELLANEOUS) ×3 IMPLANT
PACEMAKER EDORA 8DR-T MRI (Pacemaker) ×3 IMPLANT
PAD PRO RADIOLUCENT 2001M-C (PAD) ×3 IMPLANT
SELECT SECURE 3830 383069 (Lead) ×3 IMPLANT
SHEATH 7FR PRELUDE SNAP 13 (SHEATH) ×9 IMPLANT
SLITTER 6232ADJ (MISCELLANEOUS) ×3 IMPLANT
TRAY PACEMAKER INSERTION (PACKS) ×3 IMPLANT
WIRE HI TORQ VERSACORE J 260CM (WIRE) ×3 IMPLANT

## 2020-03-22 NOTE — H&P (Signed)
EP Attending  Denies chest pain or sob. She has had a stable night but remains in 2:1 AV block with a HR in the high 30's. She feels well. Her exam reveals a pleasant elderly woman, NAD. CV Regular bradycardia. Lungs are clear. Ext are warm. Tele as above.   A/P 1. 2:1 AV block after SVT ablation - I have reviewed the indications for PPM and she wishes to proceed. I would anticipate DC home later today.  Sharlot Gowda Maddon Horton,MD

## 2020-03-22 NOTE — Discharge Summary (Addendum)
DISCHARGE SUMMARY    Patient ID: SPENSER CONG,  MRN: [id number], DOB/AGE: [id number] y.o.  Admit date: 03/19/2020 Discharge date: 03/22/2020  Primary Care Physician: Daylene Katayama, [place]  Primary Cardiologist: Dr. Eldridge Dace Electrophysiologist: new, Dr. Ladona Ridgel  Primary Discharge Diagnosis:  1. Syncope 2. CP 3. SVT  Secondary Discharge Diagnosis:  1. CAD 2. ICM 3. Chronic CHF 4. Paroxysmal AFib 5. H/o LV thrombus 6. CKD (II-III)  Allergies  Allergen Reactions  . Amiodarone Nausea And Vomiting  . Cymbalta [Duloxetine Hcl] Other (See Comments)    "Made me feel crazy"     Procedures This Admission:  3.  Implantation of a Biotronik dual chamber PPM on 03/22/2020 by Dr Ladona Ridgel.   (see procedure report for details) There were no immediate post procedure complications.   2. EPS/ablation, 03/21/2020 Conclusion: Invasive EP study and catheter ablation of AV node reentrant tachycardia which had caused syncope resulting in the creation of complete heart block.  It is anticipated that the patient will undergo pacemaker implantation if her AV conduction does not return.  Because of her fairly marked A-V conduction dysfunction preablation it is unlikely that this will be the case    1. LHC 03/22/2020  1. Mid LM to Dist LM lesion is 30% stenosed.  Mid LAD lesion is 50% stenosed.  Previously placed Ost LAD to Prox LAD drug eluting stent is widely patent.  Balloon angioplasty was performed.  Dist RCA lesion is 95% stenosed.  LV end diastolic pressure is normal.    1. Severe single vessel obstructive CAD    - the stent in the ostial/proximal LAD is widely patent.     - 95% mid RCA at high bifurcation of a large PDA and PLOM. Medina class 1,1,1. There is moderate tortuosity of the RCA 2. Normal LVEDP Plan: Reviewed films with Dr Eldridge Dace. The RCA stenosis is unchanged. It is a complex bifurcation stenosis. It would be difficult to treat percutaneously without  potential compromise of a large branch. Recommend continued medical therapy. In the future would consider alternate access other than the right radial artery given its small size.      Brief HPI: KALIANNE FETTING is a 86 y.o. female w/PMHx of CAD PCI/DES to pLAD and staged RCA (95% stenosis), ischemic cardiomyopathy, brief episodes of paroxysmal afib, chronic systolic CHF, prior thrombus, CKD presented to ED 03/19/20 for chest pain and syncope. She was brought by EMS. Patient reported 2-3 days of chest pain/pressure, worse with activity. Chest pain is resolved at rest. Pain would last only a couple minutes. Had associated sob and some diaphoresis. No N/V. She had been taking SL nitro for the CP multiple times. Said pain was similar to MI in 2019. This morning while eating she syncopized and fell on the floor from sitting. She was unaware that she feel. She only felt dizziness and chest pressure prior to falling. Her husband called EMS who found her to be in SVT with heart rates in the 190s and SBPs in the 80s. She was given adenosine x3 (6, 12, 12) with conversion to sinus tachycardia>NSR. Chest pain resolved after this. She was admitted for further evaluation and management   Hospital Course:  In the ED BP 100/85, pulse 96, RR 21, 97% O2. Labs showed potassium 3.8, glucose 107, creatinine a,41, Mag 1.7, normal LFTs, WBC 9.9, Hgb 11.8. Hs troponin 22>25. CXR showed low lung volumes with left lower lobe atelectasis vs airspace consolidation.EKG showed NSR 99bpm, 1st degree  AV block, TWI aVL, and minimal STE inf leads. EMS strips show STE. CT head/neck showed no acute trauma.  Home Eliquis held, started on heparin gtt, CP suspect 2/2 SVT, flat HS Trop of 27 and 22 Given her CAD hx and CP planned for cath.  There was question as to how she was taking metoprolol at home. She has known hx of ICM, not on ACE/ARB outpt with renal insufficiency, LVEF 01/12/2020 35-40%.  She was not felt to be volume OL.  Noted  prior intolerance of amiodarone. She had no recurrent SVT, telemetry noted baseline conduction system disease with marked 1st degree AVblock and intermittent Mobitz one.  Yesterday she underwent LHC with stable CAD and no intervention.   Initially planned for outpatient EPS/ablation though the patient and daughter very worried about SVT happening again.  And in disscussion suspect her bouts of CP of late may be episodes of SVT without any perception of palpitations.  She underwent EPS and ablation of AV node reentrant tachycardia that resulted in CHB. She was monitored overnight in ICU to monitor for recovery of AV conduction, though remained in CHB and underwent PPM implant today by Dr. Ladona Ridgel.  R radial site is stable, without hematoma or ecchymosis R groin site is stable, without hematoma or ecchymosis L chest site is  stable without hematoma or ecchymosis.  The device was interrogated and found to be functioning normally.  Wound care, arm mobility, and restrictions were reviewed with the patient and her daughter at bedside.  The patient feels well, denies any CP/SOB, with minimal pacemaker site discomfort.   No pain at R wrist or groin.   Dr. Ladona Ridgel considered her stable for discharge to home this evening.   A CXR and device check will be done prior to discharge, and once reviewed by Dr. Ladona Ridgel, will clear her discharge.  I discussed this with the nurse.   Dr. Eldridge Dace, also considered the patient stable for discharge to home, same home medicines, will contiue on Toprol daily 25mg  (not lopressor) Daughter mentions that she has never taken the Imdur.  Will discontinue it given her CP of late is now felt to have been tachycardia and cath noted her CAD is stable.  She had s/l NTG if needed.  She ambulated yesterday with PT, recommends PT for home, patient and daughter are agreeable. She lives with her husband and has good family support.  No Eliquis or plavix until Sunday 103/2021  Telemetry is  SR, V pacing   Physical Exam: Vitals:   03/22/20 1000 03/22/20 1015 03/22/20 1045 03/22/20 1246  BP: (!) 130/53 (!) 124/59 (!) 159/57 (!) 123/54  Pulse: 64 71 76 79  Resp: 14 14 20 18   Temp:    98.2 F (36.8 C)  TempSrc:    Oral  SpO2: 97% 99% 96% 91%  Weight:      Height:         GEN- The patient is well appearing, alert and oriented x 3 today.   HEENT: normocephalic, atraumatic; sclera clear, conjunctiva pink; hearing intact; oropharynx clear; neck supple, no JVP Lungs-  CTA b/l, normal work of breathing.  No wheezes, rales, rhonchi Heart- RRR, no murmurs, rubs or gallops, PMI not laterally displaced GI- soft, non-tender, non-distended Extremities- no clubbing, cyanosis, or edema MS- no significant deformity or atrophy Skin- warm and dry, no rash or lesion, left chest without hematoma/ecchymosis Psych- euthymic mood, full affect Neuro- no gross deficits R wrist is stable, R groin stable, no bleeding,  hematoma or pain at either site  Labs:   Lab Results  Component Value Date   WBC 7.8 03/22/2020   HGB 9.5 (L) 03/22/2020   HCT 31.6 (L) 03/22/2020   MCV 94.9 03/22/2020   PLT 213 03/22/2020    Recent Labs  Lab 03/19/20 1133 03/20/20 0314 03/22/20 0326  NA 141   < > 139  K 3.8   < > 4.9  CL 103   < > 107  CO2 26   < > 24  BUN 31*   < > 26*  CREATININE 1.41*   < > 0.90  CALCIUM 8.9   < > 8.8*  PROT 6.9  --   --   BILITOT 0.8  --   --   ALKPHOS 63  --   --   ALT 17  --   --   AST 21  --   --   GLUCOSE 107*   < > 108*   < > = values in this interval not displayed.    Discharge Medications:  Allergies as of 03/22/2020      Reactions   Amiodarone Nausea And Vomiting   Cymbalta [duloxetine Hcl] Other (See Comments)   "Made me feel crazy"      Medication List    STOP taking these medications   isosorbide mononitrate 30 MG 24 hr tablet Commonly known as: IMDUR   metoprolol tartrate 25 MG tablet Commonly known as: LOPRESSOR     TAKE these medications    acetaminophen 500 MG tablet Commonly known as: TYLENOL Take 1,000 mg by mouth every 4 (four) hours as needed for mild pain.   ANTI-OXIDANT FORMULA PO Take 1 tablet by mouth daily.   multivitamin-lutein Caps capsule Take 1 capsule by mouth daily.   apixaban 5 MG Tabs tablet Commonly known as: Eliquis Take 1 tablet (5 mg total) by mouth 2 (two) times daily. Notes to patient: Do not resume until Sunday, 03/27/2020   ASTAXANTHIN PO Take 1 tablet by mouth daily.   atorvastatin 40 MG tablet Commonly known as: LIPITOR Take 1 tablet (40 mg total) by mouth 3 (three) times a week. What changed: additional instructions   BERBERINE COMPLEX PO Take 1 capsule by mouth daily.   clopidogrel 75 MG tablet Commonly known as: PLAVIX TAKE 1 TABLET(75 MG) BY MOUTH DAILY What changed:   how much to take  how to take this  when to take this Notes to patient: Do not resume until 03/27/2020   ferrous sulfate 325 (65 FE) MG tablet Take 325 mg by mouth daily with breakfast.   Fish Oil 1000 MG Caps Take 1,000 mg by mouth daily.   HAWTHORN BERRY PO Take 1 tablet by mouth daily.   magnesium gluconate 500 MG tablet Commonly known as: MAGONATE Take 500 mg by mouth daily.   meclizine 25 MG tablet Commonly known as: ANTIVERT Take 25 mg by mouth daily as needed for dizziness.   metoprolol succinate 25 MG 24 hr tablet Commonly known as: TOPROL-XL Take 1 tablet (25 mg total) by mouth daily.   nitroGLYCERIN 0.4 MG SL tablet Commonly known as: NITROSTAT Place 1 tablet (0.4 mg total) under the tongue every 5 (five) minutes as needed for chest pain.   OIL OF OREGANO PO Take 1 capsule by mouth daily.   ondansetron 4 MG disintegrating tablet Commonly known as: ZOFRAN-ODT Take 4 mg by mouth every 8 (eight) hours as needed for nausea or vomiting.   promethazine 12.5 MG tablet  Commonly known as: PHENERGAN Take 1 tablet (12.5 mg total) by mouth every 6 (six) hours as needed for up to 7 days  for nausea or vomiting.   QUERCETIN PO Take 1 tablet by mouth daily.   traMADol 50 MG tablet Commonly known as: ULTRAM Take 50 mg by mouth every 6 (six) hours as needed for moderate pain.   Turmeric 500 MG Tabs Take 500 mg by mouth daily.   vitamin B-12 1000 MCG tablet Commonly known as: CYANOCOBALAMIN Take 1,000 mcg by mouth daily.   vitamin C 1000 MG tablet Take 500 mg by mouth daily.   WHITE WILLOW BARK PO Take 1 capsule by mouth daily.   zinc gluconate 50 MG tablet Take 50 mg by mouth daily.       Disposition: Home    Follow-up Information    [facility] [address] [facility] Follow up.   Specialty: Cardiology Why: 04/05/2020 @ 2:40PM, wound check visit Contact information: [address], [address] [place] [id number] [phone]       Marinus Mawaylor, Shasta Chinn W, MD Follow up.   Specialty: Cardiology Why: 07/05/2020 @ 1:45PM Contact information: [address] [place] [address] [phone]        Corky CraftsVaranasi, Jayadeep S, MD Follow up.   Specialties: Cardiology, Radiology, Interventional Cardiology Why: 06/15/2020 @ 4:00PM Contact information: [address] [place] [address] [phone]               Duration of Discharge Encounter: Greater than 30 minutes including physician time.  Norma FredricksonSigned, Renee Ursuy, PA-C 03/22/2020 2:23 PM  EP Attending  Patient seen and examined. Agree with above. I have reviewed her CXR and this demonstrates no PTX and stable lead position. Interrogation of her PPM demonstrates normal DDD PM function. She may be discharged home with usual followup.  Sharlot GowdaGregg Seville Downs,MD

## 2020-03-22 NOTE — Progress Notes (Signed)
Received pt post pacemaker placement , feft chest site slightly swollen. Pt alert and oriented.

## 2020-03-22 NOTE — Progress Notes (Signed)
D/C instructions given and reviewed. Pt is awaiting IV antibiotics, EKG and CXR prior to D/C.

## 2020-03-22 NOTE — Discharge Instructions (Signed)
Post ablation procedure care instructions (right groin instructions) No driving for 4 days. No lifting over 5 lbs for 1 week. No vigorous or sexual activity for 1 week. You may return to work/your usual activities on 03/28/2020. Keep procedure site clean & dry. If you notice increased pain, swelling, bleeding or pus, call/return!  You may shower, but no soaking baths/hot tubs/pools for 1 week.    Radial Site Care (right wrist)  This sheet gives you information about how to care for yourself after your procedure. Your health care provider may also give you more specific instructions. If you have problems or questions, contact your health care provider. What can I expect after the procedure? After the procedure, it is common to have:  Bruising and tenderness at the catheter insertion area. Follow these instructions at home: Medicines  Take over-the-counter and prescription medicines only as told by your health care provider. Insertion site care  Follow instructions from your health care provider about how to take care of your insertion site. Make sure you: ? Wash your hands with soap and water before you change your bandage (dressing). If soap and water are not available, use hand sanitizer. ? Change your dressing as told by your health care provider. ? Leave stitches (sutures), skin glue, or adhesive strips in place. These skin closures may need to stay in place for 2 weeks or longer. If adhesive strip edges start to loosen and curl up, you may trim the loose edges. Do not remove adhesive strips completely unless your health care provider tells you to do that.  Check your insertion site every day for signs of infection. Check for: ? Redness, swelling, or pain. ? Fluid or blood. ? Pus or a bad smell. ? Warmth.  Do not take baths, swim, or use a hot tub until your health care provider approves.  You may shower as directed by your health care provider(not until 03/29/2020) ? Remove the  dressing and gently wash the site with plain soap and water. ? Pat the area dry with a clean towel. ? Do not rub the site. That could cause bleeding.  Do not apply powder or lotion to the site. Activity   For 24 hours after the procedure, or as directed by your health care provider: ? Do not flex or bend the affected arm. ? Do not push or pull heavy objects with the affected arm. ? Do not drive yourself home from the hospital or clinic. You may drive 24 hours after the procedure unless your health care provider tells you not to. ? Do not operate machinery or power tools.  Do not lift anything that is heavier than 10 lb (4.5 kg), or the limit that you are told, until your health care provider says that it is safe.  Ask your health care provider when it is okay to: ? Return to work or school. ? Resume usual physical activities or sports. ? Resume sexual activity. General instructions  If the catheter site starts to bleed, raise your arm and put firm pressure on the site. If the bleeding does not stop, get help right away. This is a medical emergency.  If you went home on the same day as your procedure, a responsible adult should be with you for the first 24 hours after you arrive home.  Keep all follow-up visits as told by your health care provider. This is important. Contact a health care provider if:  You have a fever.  You have redness, swelling,  or yellow drainage around your insertion site. Get help right away if:  You have unusual pain at the radial site.  The catheter insertion area swells very fast.  The insertion area is bleeding, and the bleeding does not stop when you hold steady pressure on the area.  Your arm or hand becomes pale, cool, tingly, or numb. These symptoms may represent a serious problem that is an emergency. Do not wait to see if the symptoms will go away. Get medical help right away. Call your local emergency services (911 in the [place] Do not drive  yourself to the hospital. Summary  After the procedure, it is common to have bruising and tenderness at the site.  Follow instructions from your health care provider about how to take care of your radial site wound. Check the wound every day for signs of infection.  Do not lift anything that is heavier than 10 lb (4.5 kg), or the limit that you are told, until your health care provider says that it is safe. This information is not intended to replace advice given to you by your health care provider. Make sure you discuss any questions you have with your health care provider. Document Revised: 07/17/2017 Document Reviewed: 07/17/2017 Elsevier Patient Education  2020 [employer].       Supplemental Discharge Instructions for  Pacemaker/Defibrillator Patients  Tomorrow, 03/23/2020, send in a device transmission  Activity No heavy lifting or vigorous activity with your left/right arm for 6 to 8 weeks.  Do not raise your left/right arm above your head for one week.  Gradually raise your affected arm as drawn below.             03/26/2020                 03/27/2020               03/28/2020               03/29/2020 __  NO DRIVING (patient does not drive)  WOUND CARE - Keep the wound area clean and dry.  Do not get this area wet , no showers for one week; you may shower on     . - Tomorrow, 03/23/2020, remove the arm sling - Tomorrow, 03/23/2020 remove the outer plastic bandage.  Underneath the plastic bandage there are steris strips (paper tapes), DO NOT remove these. - The tape/steri-strips on your wound will fall off; do not pull them off.  No bandage is needed on the site.  DO  NOT apply any creams, oils, or ointments to the wound area. - If you notice any drainage or discharge from the wound, any swelling or bruising at the site, or you develop a fever > 101? F after you are discharged home, call the office at once.  Special Instructions - You are still able to use cellular telephones;  use the ear opposite the side where you have your pacemaker/defibrillator.  Avoid carrying your cellular phone near your device. - When traveling through airports, show security personnel your identification card to avoid being screened in the metal detectors.  Ask the security personnel to use the hand wand. - Avoid arc welding equipment, MRI testing (magnetic resonance imaging), TENS units (transcutaneous nerve stimulators).  Call the office for questions about other devices. - Avoid electrical appliances that are in poor condition or are not properly grounded. - Microwave ovens are safe to be near or to operate.

## 2020-03-22 NOTE — TOC Transition Note (Signed)
Transition of Care [facility]) - CM/SW Discharge Note   Patient Details  Name: PEARLENA OW MRN: [id number] Date of Birth: May 20, 1934  [facility] Care [facility]) CM/SW Contact:  Leone [place], RN Phone Number: 03/22/2020, 2:57 PM   Clinical Narrative:    Patient will be dc later today around 4:30 after xray per PA .  She will not be starting on any new meds.  NCM offered choice for HHPT, she does not have a preference. NCM made referral to [facility].  She is able to take referral for HHPT  Final next level of care: Home w Home Health Services Barriers to Discharge: No Barriers Identified   Patient Goals and CMS Choice   CMS Medicare.gov Compare Post Acute Care list provided to:: Patient Represenative (must comment) Choice offered to / list presented to : Adult Children  Discharge Placement                       Discharge Plan and Services                  DME Agency: NA       HH Arranged: PT HH Agency: [facility] Date HH Agency Contacted: 03/22/20 Time HH Agency Contacted: 1300 Representative spoke with at [facility] Agency: Rosey Bath  Social Determinants of Health (SDOH) Interventions     Readmission Risk Interventions No flowsheet data found.

## 2020-03-22 NOTE — Progress Notes (Signed)
Called radiolgy to follow up CXR ordered earlier. [facility] said transport is way behind but will send to transport  Pt.

## 2020-03-22 NOTE — TOC Progression Note (Addendum)
Transition of Care [facility]) - Progression Note    Patient Details  Name: [name] MRN: [id number] Date of Birth: 1933-09-25  Transition of Care [facility]) CM/SW Contact  Leone Haven, RN Phone Number: 03/22/2020, 1:14 PM  Clinical Narrative:    Patient will be dc later today around 4:30 after xray per PA .  She will not be starting on any new meds. She has no needs. NCM offered choice for HHPT, she does not have a preference. NCM made referral to [facility] with [facility]  She is able to take referral for HHPT.        Expected Discharge Plan and Services           Expected Discharge Date: 03/22/20                                     Social Determinants of Health (SDOH) Interventions    Readmission Risk Interventions No flowsheet data found.

## 2020-03-22 NOTE — TOC Benefit Eligibility Note (Signed)
Transition of Care [facility]) Benefit Eligibility Note    Patient Details  Name: [name] MRN: [id number] Date of Birth: [birth year]   Medication/Dose: ENTRESTO  24 TO 26  MG BID  Covered?: Yes  Tier: 3 Drug  Prescription Coverage Preferred Pharmacy: [facility] with Person/Company/Phone Number:: JASMINE  @ [employer] # [phone]  Co-Pay: $45.00  Prior Approval: Yes (# [phone])  Deductible: Unmet       Mardene Sayer Phone Number: 03/22/2020, 3:33 PM

## 2020-03-23 ENCOUNTER — Telehealth: Payer: Self-pay

## 2020-03-23 ENCOUNTER — Encounter (HOSPITAL_COMMUNITY): Payer: Self-pay | Admitting: Internal Medicine

## 2020-03-23 ENCOUNTER — Other Ambulatory Visit: Payer: Self-pay

## 2020-03-23 MED ORDER — METOPROLOL SUCCINATE ER 25 MG PO TB24
25.0000 mg | ORAL_TABLET | Freq: Every day | ORAL | 0 refills | Status: DC
Start: 2020-03-23 — End: 2020-06-01

## 2020-03-23 NOTE — Telephone Encounter (Signed)
Follow-up after same day discharge: Implant date: 03/22/20 MD: Lewayne Bunting, MD Device: Biotronik Dual PPM Location: Left Chest   Wound check visit: 04/05/20 90 day MD follow-up: 07/05/2020  Remote Transmission received: Remote scheduled 03/24/20  Dressing removed: Denies any issues with PPM site.   Scheduled remote 03/24/20. Advised patient and family if we do not call there are no changes. Advised to call with any questions or concerns. Verbalized understanding.

## 2020-03-25 ENCOUNTER — Other Ambulatory Visit: Payer: Self-pay | Admitting: Interventional Cardiology

## 2020-03-29 ENCOUNTER — Telehealth: Payer: Self-pay | Admitting: Internal Medicine

## 2020-03-29 NOTE — Telephone Encounter (Signed)
Returned call to Home Health  Advised ok to proceed with PT.  Await faxed orders

## 2020-03-29 NOTE — Telephone Encounter (Signed)
Home Health Verbal Orders - Caller/Agency: Crystal with [facility] Callback Number: [phone] Requesting OT/PT/Skilled Nursing/Social Work/Speech Therapy: Needing verbals for PT  Frequency:  2 week 4 1 week 5

## 2020-04-05 ENCOUNTER — Ambulatory Visit (INDEPENDENT_AMBULATORY_CARE_PROVIDER_SITE_OTHER): Payer: Medicare HMO | Admitting: Emergency Medicine

## 2020-04-05 ENCOUNTER — Other Ambulatory Visit: Payer: Self-pay

## 2020-04-05 DIAGNOSIS — I442 Atrioventricular block, complete: Secondary | ICD-10-CM

## 2020-04-05 LAB — CUP PACEART INCLINIC DEVICE CHECK
Date Time Interrogation Session: 20211012151810
Implantable Lead Implant Date: 20210928
Implantable Lead Implant Date: 20210928
Implantable Lead Location: 753859
Implantable Lead Location: 753860
Implantable Lead Model: 377171
Implantable Lead Model: 3830
Implantable Lead Serial Number: 7000335832
Implantable Pulse Generator Implant Date: 20210928
Lead Channel Impedance Value: 448 Ohm
Lead Channel Impedance Value: 526 Ohm
Lead Channel Pacing Threshold Amplitude: 0.5 V
Lead Channel Pacing Threshold Amplitude: 0.5 V
Lead Channel Pacing Threshold Amplitude: 0.6 V
Lead Channel Pacing Threshold Amplitude: 0.8 V
Lead Channel Pacing Threshold Amplitude: 0.8 V
Lead Channel Pacing Threshold Amplitude: 0.9 V
Lead Channel Pacing Threshold Pulse Width: 0.4 ms
Lead Channel Pacing Threshold Pulse Width: 0.4 ms
Lead Channel Pacing Threshold Pulse Width: 0.4 ms
Lead Channel Pacing Threshold Pulse Width: 0.4 ms
Lead Channel Pacing Threshold Pulse Width: 0.4 ms
Lead Channel Pacing Threshold Pulse Width: 0.4 ms
Lead Channel Sensing Intrinsic Amplitude: 4.5 mV
Lead Channel Sensing Intrinsic Amplitude: 5 mV
Lead Channel Setting Pacing Amplitude: 3 V
Lead Channel Setting Pacing Amplitude: 3.4 V
Lead Channel Setting Pacing Pulse Width: 0.4 ms
Pulse Gen Model: 407145
Pulse Gen Serial Number: 69911230

## 2020-04-05 NOTE — Progress Notes (Signed)
Wound check appointment. Steri-strips removed. Wound without redness. Small amount of swelling noted around site, soft to palpitation. No redness or warmth noted.  Patient education given that she can use an ice pack for 10/15 miuntes 3 times a day. Advised to place barrier between her skin and ice pack such as wash cloth.  Advised to call if swelling increased, verbalized understanding.  Incision edges approximated. Normal device function. Thresholds, sensing, and impedances consistent with implant measurements. Device programmed at 3.5V for extra safety margin until 3 month visit. Histogram distribution appropriate for patient and level of activity. No mode switches or ventricular arrhythmias noted. Patient educated about wound care, arm mobility, lifting restrictions, shock plan. ROV in 3 months with implanting physician. Patient is not in Biotronik at this time, email sent to Corlis Hove., states he will take care of it. Patient has monitor at home and education provided.

## 2020-04-25 ENCOUNTER — Telehealth: Payer: Self-pay | Admitting: Emergency Medicine

## 2020-04-25 ENCOUNTER — Ambulatory Visit: Payer: Medicare HMO | Admitting: Internal Medicine

## 2020-04-25 NOTE — Telephone Encounter (Signed)
Alert for AF 04/24/20 that was 4 hours 32 minutes in duration. Spoke with Ivey(DPR) and she reports patient was asymptomatic 04/24/20. Confirmed patient is taking Eliquis 5 mg BID and Plavix 75 mg qd. Will forward to cardiology to confirm if both medications required. Lajoyce Corners informed that she will not receive return call unless patient treatment plan changes.Confirmed with Clementeen Graham RN that medication list correct with Plavix and Eliquis.

## 2020-04-27 ENCOUNTER — Other Ambulatory Visit: Payer: Self-pay | Admitting: Interventional Cardiology

## 2020-05-11 ENCOUNTER — Emergency Department (HOSPITAL_BASED_OUTPATIENT_CLINIC_OR_DEPARTMENT_OTHER)
Admission: EM | Admit: 2020-05-11 | Discharge: 2020-05-11 | Disposition: A | Discharge status: Home / Self Care | Payer: Medicare HMO | Attending: Emergency Medicine | Admitting: Emergency Medicine

## 2020-05-11 ENCOUNTER — Emergency Department (HOSPITAL_BASED_OUTPATIENT_CLINIC_OR_DEPARTMENT_OTHER): Payer: Medicare HMO

## 2020-05-11 ENCOUNTER — Other Ambulatory Visit: Payer: Self-pay

## 2020-05-11 ENCOUNTER — Encounter (HOSPITAL_BASED_OUTPATIENT_CLINIC_OR_DEPARTMENT_OTHER): Payer: Self-pay | Admitting: Emergency Medicine

## 2020-05-11 DIAGNOSIS — Z7982 Long term (current) use of aspirin: Secondary | ICD-10-CM | POA: Diagnosis not present

## 2020-05-11 DIAGNOSIS — Z7902 Long term (current) use of antithrombotics/antiplatelets: Secondary | ICD-10-CM | POA: Insufficient documentation

## 2020-05-11 DIAGNOSIS — I5043 Acute on chronic combined systolic (congestive) and diastolic (congestive) heart failure: Secondary | ICD-10-CM | POA: Insufficient documentation

## 2020-05-11 DIAGNOSIS — Z7901 Long term (current) use of anticoagulants: Secondary | ICD-10-CM | POA: Insufficient documentation

## 2020-05-11 DIAGNOSIS — R109 Unspecified abdominal pain: Secondary | ICD-10-CM

## 2020-05-11 DIAGNOSIS — N183 Chronic kidney disease, stage 3 unspecified: Secondary | ICD-10-CM | POA: Diagnosis not present

## 2020-05-11 DIAGNOSIS — R112 Nausea with vomiting, unspecified: Secondary | ICD-10-CM | POA: Insufficient documentation

## 2020-05-11 DIAGNOSIS — I130 Hypertensive heart and chronic kidney disease with heart failure and stage 1 through stage 4 chronic kidney disease, or unspecified chronic kidney disease: Secondary | ICD-10-CM | POA: Diagnosis not present

## 2020-05-11 DIAGNOSIS — R1031 Right lower quadrant pain: Secondary | ICD-10-CM | POA: Insufficient documentation

## 2020-05-11 DIAGNOSIS — Z951 Presence of aortocoronary bypass graft: Secondary | ICD-10-CM | POA: Insufficient documentation

## 2020-05-11 DIAGNOSIS — I2510 Atherosclerotic heart disease of native coronary artery without angina pectoris: Secondary | ICD-10-CM | POA: Diagnosis not present

## 2020-05-11 DIAGNOSIS — R0789 Other chest pain: Secondary | ICD-10-CM | POA: Diagnosis not present

## 2020-05-11 DIAGNOSIS — Z950 Presence of cardiac pacemaker: Secondary | ICD-10-CM | POA: Diagnosis not present

## 2020-05-11 DIAGNOSIS — Z79899 Other long term (current) drug therapy: Secondary | ICD-10-CM | POA: Insufficient documentation

## 2020-05-11 LAB — CBC WITH DIFFERENTIAL/PLATELET
Abs Immature Granulocytes: 0.01 10*3/uL (ref 0.00–0.07)
Basophils Absolute: 0.1 10*3/uL (ref 0.0–0.1)
Basophils Relative: 1 %
Eosinophils Absolute: 0.3 10*3/uL (ref 0.0–0.5)
Eosinophils Relative: 4 %
HCT: 33 % — ABNORMAL LOW (ref 36.0–46.0)
Hemoglobin: 10.2 g/dL — ABNORMAL LOW (ref 12.0–15.0)
Immature Granulocytes: 0 %
Lymphocytes Relative: 32 %
Lymphs Abs: 2.4 10*3/uL (ref 0.7–4.0)
MCH: 27.9 pg (ref 26.0–34.0)
MCHC: 30.9 g/dL (ref 30.0–36.0)
MCV: 90.4 fL (ref 80.0–100.0)
Monocytes Absolute: 0.8 10*3/uL (ref 0.1–1.0)
Monocytes Relative: 11 %
Neutro Abs: 3.9 10*3/uL (ref 1.7–7.7)
Neutrophils Relative %: 52 %
Platelets: 287 10*3/uL (ref 150–400)
RBC: 3.65 MIL/uL — ABNORMAL LOW (ref 3.87–5.11)
RDW: 16.2 % — ABNORMAL HIGH (ref 11.5–15.5)
WBC: 7.4 10*3/uL (ref 4.0–10.5)
nRBC: 0 % (ref 0.0–0.2)

## 2020-05-11 LAB — COMPREHENSIVE METABOLIC PANEL
ALT: 12 U/L (ref 0–44)
AST: 17 U/L (ref 15–41)
Albumin: 3.7 g/dL (ref 3.5–5.0)
Alkaline Phosphatase: 62 U/L (ref 38–126)
Anion gap: 8 (ref 5–15)
BUN: 23 mg/dL (ref 8–23)
CO2: 25 mmol/L (ref 22–32)
Calcium: 8.8 mg/dL — ABNORMAL LOW (ref 8.9–10.3)
Chloride: 105 mmol/L (ref 98–111)
Creatinine, Ser: 0.86 mg/dL (ref 0.44–1.00)
GFR, Estimated: >60 mL/min (ref >60)
Glucose, Bld: 112 mg/dL — ABNORMAL HIGH (ref 70–99)
Potassium: 4.2 mmol/L (ref 3.5–5.1)
Sodium: 138 mmol/L (ref 135–145)
Total Bilirubin: 0.3 mg/dL (ref 0.3–1.2)
Total Protein: 6.9 g/dL (ref 6.5–8.1)

## 2020-05-11 LAB — URINALYSIS, ROUTINE W REFLEX MICROSCOPIC
Bilirubin Urine: NEGATIVE
Glucose, UA: NEGATIVE mg/dL
Hgb urine dipstick: NEGATIVE
Ketones, ur: NEGATIVE mg/dL
Leukocytes,Ua: NEGATIVE
Nitrite: NEGATIVE
Protein, ur: NEGATIVE mg/dL
Specific Gravity, Urine: 1.01 (ref 1.005–1.030)
pH: 5.5 (ref 5.0–8.0)

## 2020-05-11 LAB — TROPONIN I (HIGH SENSITIVITY)
Troponin I (High Sensitivity): 14 ng/L (ref <18)
Troponin I (High Sensitivity): 13 ng/L (ref <18)

## 2020-05-11 LAB — LIPASE, BLOOD: Lipase: 23 U/L (ref 11–51)

## 2020-05-11 MED ORDER — IOHEXOL 300 MG/ML  SOLN
100.0000 mL | Freq: Once | INTRAMUSCULAR | Status: AC | PRN
  Administered 2020-05-11: 100 mL via INTRAVENOUS

## 2020-05-11 MED ORDER — ASPIRIN 81 MG PO CHEW
324.0000 mg | CHEWABLE_TABLET | Freq: Once | ORAL | Status: AC
  Administered 2020-05-11: 324 mg via ORAL
  Filled 2020-05-11: qty 4

## 2020-05-11 MED ORDER — FENTANYL CITRATE (PF) 100 MCG/2ML IJ SOLN
50.0000 ug | Freq: Once | INTRAMUSCULAR | Status: AC
  Administered 2020-05-11: 50 ug via INTRAVENOUS
  Filled 2020-05-11: qty 2

## 2020-05-11 NOTE — ED Notes (Signed)
Per pt unable to provide urine collection at this time. 

## 2020-05-11 NOTE — Discharge Instructions (Signed)
If you develop worsening, continued, or recurrent abdominal pain, uncontrolled vomiting, fever, chest or back pain, or any other new/concerning symptoms then return to the ER for evaluation.  

## 2020-05-11 NOTE — ED Notes (Signed)
Review D/C papers with pt, pt states understanding, pt denies questions at this time. 

## 2020-05-11 NOTE — ED Provider Notes (Signed)
[facility] EMERGENCY DEPARTMENT Provider Note   CSN: [id number] Arrival date & time: 05/11/20  0751     History Chief Complaint  Patient presents with  . Abdominal Pain    chest pain    [name] is a 86 y.o. female.  HPI 86 year old female presents with right abdominal pain.  Started in the middle of the night this morning.  Woke her up from sleep.  It is a constant pain that is not sharp and rated as an 8 out of 10.  Does not radiate.  Has chronic back pain but no new back pain today.  No urinary symptoms.  She has had nausea without vomiting.  No change in bowel movements.  No fevers.  Has never had pain like this before.  She does note that sometimes since the pain started she has also developed chest pressure that feels like prior heart attack type pain.  The pressure is not gone but is better than when it first started. Mild dyspnea.   Past Medical History:  Diagnosis Date  . Acute systolic heart failure (HCC)   . Arthritis   . CHF (congestive heart failure) (HCC)   . Coronary artery disease   . Hyperlipidemia   . PAF (paroxysmal atrial fibrillation) (HCC)   . STEMI (ST elevation myocardial infarction) (HCC)    04/04/18 PCI/DES to the pLAD, 95% dRCA (possible staged intervention), 75% mLAD, EF 45%    Patient Active Problem List   Diagnosis Date Noted  . Complete heart block (HCC) 03/21/2020  . Exertional chest pain   . SVT (supraventricular tachycardia) (HCC) 03/19/2020  . Chronic systolic (congestive) heart failure (HCC) 08/15/2018  . Old MI (myocardial infarction) 08/15/2018  . Chronic bilateral low back pain without sciatica 06/06/2018  . CKD (chronic kidney disease) stage 3, GFR 30-59 ml/min (HCC) 05/15/2018  . Orthostatic hypotension 05/15/2018  . CAD (coronary artery disease) 05/15/2018  . Dehydration 05/15/2018  . AKI (acute kidney injury) (HCC) 05/14/2018  . Nausea & vomiting 05/14/2018  . CAD S/P percutaneous coronary angioplasty  05/06/2018  . Ischemic cardiomyopathy 05/06/2018  . Chronic systolic heart failure (HCC) 05/06/2018  . Acute CHF (congestive heart failure) (HCC) 04/12/2018  . Acute on chronic combined systolic and diastolic congestive heart failure (HCC) 04/12/2018  . PAF (paroxysmal atrial fibrillation) (HCC) 04/08/2018  . Acute systolic heart failure (HCC) 04/08/2018  . HLD (hyperlipidemia) 04/08/2018  . Acute anterior wall MI (HCC) 04/04/2018  . 1st degree AV block 01/22/2017  . Chronic right shoulder pain 01/22/2017  . Lichen sclerosus of female genitalia 01/22/2017  . Prediabetes 01/22/2017  . Vitamin D deficiency 12/14/2016  . Cholelithiasis with acute or chronic cholecystitis 08/10/2011  . Obesity (BMI 30.0-34.9) 08/10/2011    Past Surgical History:  Procedure Laterality Date  . CHOLECYSTECTOMY    . CHOLECYSTECTOMY  08/09/2011   Procedure: LAPAROSCOPIC CHOLECYSTECTOMY;  Surgeon: Robyne Askew, MD;  Location: [facility] ORS;  Service: General;  Laterality: N/A;  . CORONARY/GRAFT ACUTE MI REVASCULARIZATION N/A 04/04/2018   Procedure: Coronary/Graft Acute MI Revascularization;  Surgeon: Corky Crafts, MD;  Location: [facility] INVASIVE CV LAB;  Service: Cardiovascular;  Laterality: N/A;  . HERNIA REPAIR    . LEFT HEART CATH AND CORONARY ANGIOGRAPHY N/A 04/04/2018   Procedure: LEFT HEART CATH AND CORONARY ANGIOGRAPHY;  Surgeon: Corky Crafts, MD;  Location: [facility] INVASIVE CV LAB;  Service: Cardiovascular;  Laterality: N/A;  . LEFT HEART CATH AND CORONARY ANGIOGRAPHY N/A 03/21/2020   Procedure: LEFT  HEART CATH AND CORONARY ANGIOGRAPHY;  Surgeon: Swazil[place], Peter M, MD;  Location: [facility] INVASIVE CV LAB;  Service: Cardiovascular;  Laterality: N/A;  . PACEMAKER IMPLANT N/A 03/22/2020   Procedure: PACEMAKER IMPLANT;  Surgeon: Marinus Maw, MD;  Location: [facility] INVASIVE CV LAB;  Service: Cardiovascular;  Laterality: N/A;  . SVT ABLATION N/A 03/21/2020   Procedure: SVT ABLATION;  Surgeon: Marinus Maw, MD;   Location: [facility] INVASIVE CV LAB;  Service: Cardiovascular;  Laterality: N/A;  . TUBAL LIGATION       OB History   No obstetric history on file.     Family History  Problem Relation Age of Onset  . Hypertension Mother     Social History   Tobacco Use  . Smoking status: Never Smoker  . Smokeless tobacco: Never Used  Vaping Use  . Vaping Use: Never used  Substance Use Topics  . Alcohol use: No  . Drug use: No    Home Medications Prior to Admission medications   Medication Sig Start Date End Date Taking? Authorizing Provider  acetaminophen (TYLENOL) 500 MG tablet Take 1,000 mg by mouth every 4 (four) hours as needed for mild pain.     [provider]  apixaban (ELIQUIS) 5 MG TABS tablet Take 1 tablet (5 mg total) by mouth 2 (two) times daily. 06/04/18   Robbie Lis M, PA-C  Ascorbic Acid (VITAMIN C) 1000 MG tablet Take 500 mg by mouth daily.    [provider]  ASTAXANTHIN PO Take 1 tablet by mouth daily.    [provider]  atorvastatin (LIPITOR) 40 MG tablet TAKE 1 TABLET THREE TIMES WEEKLY 03/25/20   Corky Crafts, MD  Barberry-Oreg Grape-Goldenseal (BERBERINE COMPLEX PO) Take 1 capsule by mouth daily.    [provider]  clopidogrel (PLAVIX) 75 MG tablet TAKE 1 TABLET EVERY DAY 04/28/20   Corky Crafts, MD  ferrous sulfate 325 (65 FE) MG tablet Take 325 mg by mouth daily with breakfast.    [provider]  HAWTHORN BERRY PO Take 1 tablet by mouth daily.    [provider]  magnesium gluconate (MAGONATE) 500 MG tablet Take 500 mg by mouth daily.    [provider]  meclizine (ANTIVERT) 25 MG tablet Take 25 mg by mouth daily as needed for dizziness.    [provider]  metoprolol succinate (TOPROL-XL) 25 MG 24 hr tablet Take 1 tablet (25 mg total) by mouth daily. Please keep upcoming appt in January with Dr. Ladona Ridgel before anymore refills. Thank you 03/23/20   Marinus Maw, MD  Misc Natural  Products (WHITE WILLOW BARK PO) Take 1 capsule by mouth daily.    [provider]  Multiple Vitamins-Minerals (ANTI-OXIDANT FORMULA PO) Take 1 tablet by mouth daily.    [provider]  multivitamin-lutein (OCUVITE-LUTEIN) CAPS capsule Take 1 capsule by mouth daily.    [provider]  nitroGLYCERIN (NITROSTAT) 0.4 MG SL tablet Place 1 tablet (0.4 mg total) under the tongue every 5 (five) minutes as needed for chest pain. 05/08/18   Corky Crafts, MD  OIL OF OREGANO PO Take 1 capsule by mouth daily.    [provider]  Omega-3 Fatty Acids (FISH OIL) 1000 MG CAPS Take 1,000 mg by mouth daily.    [provider]  ondansetron (ZOFRAN-ODT) 4 MG disintegrating tablet Take 4 mg by mouth every 8 (eight) hours as needed for nausea or vomiting.  04/30/18   [provider]  promethazine (  PHENERGAN) 12.5 MG tablet Take 1 tablet (12.5 mg total) by mouth every 6 (six) hours as needed for up to 7 days for nausea or vomiting. 05/17/18 03/19/20  Briant Cedar, MD  QUERCETIN PO Take 1 tablet by mouth daily.    [provider]  traMADol (ULTRAM) 50 MG tablet Take 50 mg by mouth every 6 (six) hours as needed for moderate pain.     [provider]  Turmeric 500 MG TABS Take 500 mg by mouth daily.    [provider]  vitamin B-12 (CYANOCOBALAMIN) 1000 MCG tablet Take 1,000 mcg by mouth daily.    [provider]  zinc gluconate 50 MG tablet Take 50 mg by mouth daily.    [provider]    Allergies    Amiodarone and Cymbalta [duloxetine hcl]  Review of Systems   Review of Systems  Constitutional: Negative for fever.  Respiratory: Positive for shortness of breath.   Cardiovascular: Positive for chest pain.  Gastrointestinal: Positive for abdominal pain and nausea. Negative for diarrhea and vomiting.  Genitourinary: Negative for dysuria and hematuria.  Musculoskeletal: Negative for back pain.  All other  systems reviewed and are negative.   Physical Exam Updated Vital Signs BP (!) 141/69   Pulse 65   Temp (!) 97.5 F (36.4 C) (Oral)   Resp 16   Ht 5\' 6"  (1.676 m)   Wt 97.1 kg   SpO2 93%   BMI 34.54 kg/m   Physical Exam Vitals and nursing note reviewed.  Constitutional:      General: She is not in acute distress.    Appearance: She is well-developed. She is not ill-appearing or diaphoretic.  HENT:     Head: Normocephalic and atraumatic.     Right Ear: External ear normal.     Left Ear: External ear normal.     Nose: Nose normal.  Eyes:     General:        Right eye: No discharge.        Left eye: No discharge.  Cardiovascular:     Rate and Rhythm: Normal rate and regular rhythm.     Heart sounds: Normal heart sounds.  Pulmonary:     Effort: Pulmonary effort is normal.     Breath sounds: Normal breath sounds.  Abdominal:     Palpations: Abdomen is soft.     Tenderness: There is abdominal tenderness in the right lower quadrant. There is no right CVA tenderness.  Skin:    General: Skin is warm and dry.  Neurological:     Mental Status: She is alert.  Psychiatric:        Mood and Affect: Mood is not anxious.     ED Results / Procedures / Treatments   Labs (all labs ordered are listed, but only abnormal results are displayed) Labs Reviewed  COMPREHENSIVE METABOLIC PANEL - Abnormal; Notable for the following components:      Result Value   Glucose, Bld 112 (*)    Calcium 8.8 (*)    All other components within normal limits  CBC WITH DIFFERENTIAL/PLATELET - Abnormal; Notable for the following components:   RBC 3.65 (*)    Hemoglobin 10.2 (*)    HCT 33.0 (*)    RDW 16.2 (*)    All other components within normal limits  LIPASE, BLOOD  URINALYSIS, ROUTINE W REFLEX MICROSCOPIC  TROPONIN I (HIGH SENSITIVITY)  TROPONIN I (HIGH SENSITIVITY)    EKG EKG Interpretation  Date/Time:  Wednesday May 11 2020 08:21:39 EST Ventricular Rate:  78 PR Interval:     QRS Duration: 159 QT Interval:  446 QTC Calculation: 509 R Axis:   -62 Text Interpretation: Sinus rhythm Atrial premature complexes Left bundle branch block similar to Sept 28 2021 Confirmed by Pricilla LovelessGoldston, Haya Hemler [phone]([id number]) on 05/11/2020 8:26:16 AM   Radiology CT ABDOMEN PELVIS W CONTRAST  Result Date: 05/11/2020 CLINICAL DATA:  States," I woke up in the night with a pain in my side" Pain to RLQ, since 0400, hx of coronary artery disease, CHF, no other complaints EXAM: CT ABDOMEN AND PELVIS WITH CONTRAST TECHNIQUE: Multidetector CT imaging of the abdomen and pelvis was performed using the standard protocol following bolus administration of intravenous contrast. CONTRAST:  100mL OMNIPAQUE IOHEXOL 300 MG/ML  SOLN COMPARISON:  05/14/2018 FINDINGS: Lower chest: No acute abnormality. Hepatobiliary: 3 subcentimeter low-density liver lesions, inferior aspects of segment 6 and segment 5, consistent with cysts, segment 6 lesion stable and segment 5 lesions smaller than prior exams. No other liver abnormalities. Status post cholecystectomy. No bile duct dilation. Pancreas: Cystic lesion versus focally dilated main pancreatic duct, in the proximal pancreatic tail, approximately 1.6 cm in greatest dimension, stable from a CT dated 08/08/2011. No follow-up recommended given the stability. No other pancreatic abnormalities. Spleen: Normal in size without focal abnormality. Adrenals/Urinary Tract: No adrenal masses. Symmetric renal enhancement and excretion. Bilateral renal sinus cysts. No other masses. No stones and no hydronephrosis. Normal ureters. Normal bladder. Stomach/Bowel: Stomach is within normal limits. Appendix appears normal. No evidence of bowel wall thickening, distention, or inflammatory changes. Vascular/Lymphatic: Aortic atherosclerosis. No aneurysm. No enlarged lymph nodes. Reproductive: Uterus and bilateral adnexa are unremarkable. Other: Fat containing, midline supraumbilical hernia, stable from the  most recent prior CT. No bowel enters this. No ascites. Musculoskeletal: No fracture or acute finding. No osteoblastic or osteolytic lesions. IMPRESSION: 1. No acute findings. No findings to account for the patient's right-sided abdominal pain. 2. Chronic findings include a stable pancreatic cystic lesion, likely focally dilated main pancreatic duct, aortic atherosclerosis and bilateral renal sinus cysts. Electronically Signed   By: Amie Portlandavid  Ormond M.D.   On: 05/11/2020 09:30   DG Chest Portable 1 View  Result Date: 05/11/2020 CLINICAL DATA:  Chest pressure. Additional history provided: Patient reports pain in side, pain in right lower quadrant. History of coronary artery disease and CHF. EXAM: PORTABLE CHEST 1 VIEW COMPARISON:  Same day CT abdomen/pelvis 05/11/2020. Prior chest radiographs 03/22/2020 and earlier. FINDINGS: Redemonstrated left chest dual lead implantable cardiac device. Shallow inspiration radiograph. Heart size at the upper limits of normal, unchanged. Calcified aortic atherosclerosis. No appreciable airspace consolidation or pulmonary edema. No evidence of pleural effusion or pneumothorax. No acute bony abnormality is identified. IMPRESSION: Shallow inspiration radiograph. No evidence of acute cardiopulmonary abnormality. Aortic Atherosclerosis (ICD10-I70.0). Electronically Signed   By: Jackey LogeKyle  Golden DO   On: 05/11/2020 09:22    Procedures Procedures (including critical care time)  Medications Ordered in ED Medications  fentaNYL (SUBLIMAZE) injection 50 mcg (50 mcg Intravenous Given 05/11/20 0834)  aspirin chewable tablet 324 mg (324 mg Oral Given 05/11/20 0834)  iohexol (OMNIPAQUE) 300 MG/ML solution 100 mL (100 mLs Intravenous Contrast Given 05/11/20 0902)    ED Course  I have reviewed the triage vital signs and the nursing notes.  Pertinent labs & imaging results that were available during my care of the patient were reviewed by me and considered in my medical decision making  (see chart for details).  MDM Rules/Calculators/A&P                          No clear source of why she is hurting in her abdomen.  After treatment with fentanyl she has no pain and this has not recurred.  The chest pain she was having also went away.  Chest pain is likely reactive to her abdominal pain and given troponins negative x2 I think ACS is unlikely.  Doubt PE or dissection.  Labs are reassuring.  CT abdomen and pelvis has been reviewed and is benign.  At this point, with no clear source I think is reasonable to discharge her home but we discussed return precautions if pain were to recur. Final Clinical Impression(s) / ED Diagnoses Final diagnoses:  Right sided abdominal pain    Rx / DC Orders ED Discharge Orders    None       Pricilla Loveless, MD 05/11/20 1425

## 2020-05-11 NOTE — ED Notes (Signed)
Pt on cardiac monitor with cycling BP at 30 minutes.

## 2020-05-11 NOTE — ED Triage Notes (Signed)
States," I woke up in the night with a pain in my side" Pain to RLQ, since 0400

## 2020-05-11 NOTE — ED Notes (Signed)
Patient transported to CT 

## 2020-05-15 ENCOUNTER — Encounter (HOSPITAL_BASED_OUTPATIENT_CLINIC_OR_DEPARTMENT_OTHER): Payer: Self-pay | Admitting: Emergency Medicine

## 2020-05-15 ENCOUNTER — Other Ambulatory Visit: Payer: Self-pay

## 2020-05-15 ENCOUNTER — Emergency Department (HOSPITAL_BASED_OUTPATIENT_CLINIC_OR_DEPARTMENT_OTHER)
Admission: EM | Admit: 2020-05-15 | Discharge: 2020-05-15 | Disposition: A | Discharge status: Home / Self Care | Payer: Medicare HMO | Attending: Emergency Medicine | Admitting: Emergency Medicine

## 2020-05-15 DIAGNOSIS — Z955 Presence of coronary angioplasty implant and graft: Secondary | ICD-10-CM | POA: Diagnosis not present

## 2020-05-15 DIAGNOSIS — I5043 Acute on chronic combined systolic (congestive) and diastolic (congestive) heart failure: Secondary | ICD-10-CM | POA: Diagnosis not present

## 2020-05-15 DIAGNOSIS — Z7901 Long term (current) use of anticoagulants: Secondary | ICD-10-CM | POA: Insufficient documentation

## 2020-05-15 DIAGNOSIS — I130 Hypertensive heart and chronic kidney disease with heart failure and stage 1 through stage 4 chronic kidney disease, or unspecified chronic kidney disease: Secondary | ICD-10-CM | POA: Insufficient documentation

## 2020-05-15 DIAGNOSIS — Z79899 Other long term (current) drug therapy: Secondary | ICD-10-CM | POA: Diagnosis not present

## 2020-05-15 DIAGNOSIS — N1832 Chronic kidney disease, stage 3b: Secondary | ICD-10-CM | POA: Insufficient documentation

## 2020-05-15 DIAGNOSIS — R1031 Right lower quadrant pain: Secondary | ICD-10-CM | POA: Diagnosis present

## 2020-05-15 DIAGNOSIS — B029 Zoster without complications: Secondary | ICD-10-CM | POA: Insufficient documentation

## 2020-05-15 DIAGNOSIS — I2510 Atherosclerotic heart disease of native coronary artery without angina pectoris: Secondary | ICD-10-CM | POA: Insufficient documentation

## 2020-05-15 MED ORDER — PREDNISONE 20 MG PO TABS: ORAL_TABLET | ORAL | 0 refills | Status: DC

## 2020-05-15 MED ORDER — VALACYCLOVIR HCL 1 G PO TABS: 1000.0000 mg | ORAL_TABLET | Freq: Three times a day (TID) | ORAL | 0 refills | Status: AC

## 2020-05-15 MED ORDER — HYDROCODONE-ACETAMINOPHEN 5-325 MG PO TABS
1.0000 | ORAL_TABLET | Freq: Four times a day (QID) | ORAL | 0 refills | Status: DC | PRN
Start: 2020-05-15 — End: 2020-06-01

## 2020-05-15 NOTE — ED Triage Notes (Signed)
Pt here Wednesday with possible kidney stone. Pt c/o low right abdominal pain with nausea and decreased PO intake.

## 2020-05-15 NOTE — ED Provider Notes (Signed)
[facility] EMERGENCY DEPARTMENT Provider Note   CSN: [id number] Arrival date & time: 05/15/20  1036     History Chief Complaint  Patient presents with  . Herpes Zoster    [name] is a 86 y.o. female.  HPI Patient was seen 5 days ago for right lower quadrant pain.  Reports pain was severe.  She had a CT scan done.  She thought she was told that she had kidney stones and that was causing the pain.  This morning she awakened and noticed a rash on her right lower back wrapping around her side.  She recognizes the rash is shingles because her husband's had shingles twice.  He looked at it they both agreed.  They came to the emergency department to get treatment.  She reports that she still having the pain in her right lower abdomen and now she has the rash.  She has not developed fever, vomiting, pain with urination.  No drug allergies    Past Medical History:  Diagnosis Date  . Acute systolic heart failure (HCC)   . Arthritis   . CHF (congestive heart failure) (HCC)   . Coronary artery disease   . Hyperlipidemia   . PAF (paroxysmal atrial fibrillation) (HCC)   . STEMI (ST elevation myocardial infarction) (HCC)    04/04/18 PCI/DES to the pLAD, 95% dRCA (possible staged intervention), 75% mLAD, EF 45%    Patient Active Problem List   Diagnosis Date Noted  . Complete heart block (HCC) 03/21/2020  . Exertional chest pain   . SVT (supraventricular tachycardia) (HCC) 03/19/2020  . Chronic systolic (congestive) heart failure (HCC) 08/15/2018  . Old MI (myocardial infarction) 08/15/2018  . Chronic bilateral low back pain without sciatica 06/06/2018  . CKD (chronic kidney disease) stage 3, GFR 30-59 ml/min (HCC) 05/15/2018  . Orthostatic hypotension 05/15/2018  . CAD (coronary artery disease) 05/15/2018  . Dehydration 05/15/2018  . AKI (acute kidney injury) (HCC) 05/14/2018  . Nausea & vomiting 05/14/2018  . CAD S/P percutaneous coronary angioplasty 05/06/2018  .  Ischemic cardiomyopathy 05/06/2018  . Chronic systolic heart failure (HCC) 05/06/2018  . Acute CHF (congestive heart failure) (HCC) 04/12/2018  . Acute on chronic combined systolic and diastolic congestive heart failure (HCC) 04/12/2018  . PAF (paroxysmal atrial fibrillation) (HCC) 04/08/2018  . Acute systolic heart failure (HCC) 04/08/2018  . HLD (hyperlipidemia) 04/08/2018  . Acute anterior wall MI (HCC) 04/04/2018  . 1st degree AV block 01/22/2017  . Chronic right shoulder pain 01/22/2017  . Lichen sclerosus of female genitalia 01/22/2017  . Prediabetes 01/22/2017  . Vitamin D deficiency 12/14/2016  . Cholelithiasis with acute or chronic cholecystitis 08/10/2011  . Obesity (BMI 30.0-34.9) 08/10/2011    Past Surgical History:  Procedure Laterality Date  . CHOLECYSTECTOMY    . CHOLECYSTECTOMY  08/09/2011   Procedure: LAPAROSCOPIC CHOLECYSTECTOMY;  Surgeon: Robyne Askew, MD;  Location: [facility] ORS;  Service: General;  Laterality: N/A;  . CORONARY/GRAFT ACUTE MI REVASCULARIZATION N/A 04/04/2018   Procedure: Coronary/Graft Acute MI Revascularization;  Surgeon: Corky Crafts, MD;  Location: [facility] INVASIVE CV LAB;  Service: Cardiovascular;  Laterality: N/A;  . HERNIA REPAIR    . LEFT HEART CATH AND CORONARY ANGIOGRAPHY N/A 04/04/2018   Procedure: LEFT HEART CATH AND CORONARY ANGIOGRAPHY;  Surgeon: Corky Crafts, MD;  Location: [facility] INVASIVE CV LAB;  Service: Cardiovascular;  Laterality: N/A;  . LEFT HEART CATH AND CORONARY ANGIOGRAPHY N/A 03/21/2020   Procedure: LEFT HEART CATH AND CORONARY ANGIOGRAPHY;  Surgeon: [place], Peter M, MD;  Location: [facility] INVASIVE CV LAB;  Service: Cardiovascular;  Laterality: N/A;  . PACEMAKER IMPLANT N/A 03/22/2020   Procedure: PACEMAKER IMPLANT;  Surgeon: Marinus Maw, MD;  Location: [facility] INVASIVE CV LAB;  Service: Cardiovascular;  Laterality: N/A;  . SVT ABLATION N/A 03/21/2020   Procedure: SVT ABLATION;  Surgeon: Marinus Maw, MD;  Location: [facility]  INVASIVE CV LAB;  Service: Cardiovascular;  Laterality: N/A;  . TUBAL LIGATION       OB History   No obstetric history on file.     Family History  Problem Relation Age of Onset  . Hypertension Mother     Social History   Tobacco Use  . Smoking status: Never Smoker  . Smokeless tobacco: Never Used  Vaping Use  . Vaping Use: Never used  Substance Use Topics  . Alcohol use: No  . Drug use: No    Home Medications Prior to Admission medications   Medication Sig Start Date End Date Taking? Authorizing Provider  acetaminophen (TYLENOL) 500 MG tablet Take 1,000 mg by mouth every 4 (four) hours as needed for mild pain.     [provider]  apixaban (ELIQUIS) 5 MG TABS tablet Take 1 tablet (5 mg total) by mouth 2 (two) times daily. 06/04/18   Robbie Lis M, PA-C  Ascorbic Acid (VITAMIN C) 1000 MG tablet Take 500 mg by mouth daily.    [provider]  ASTAXANTHIN PO Take 1 tablet by mouth daily.    [provider]  atorvastatin (LIPITOR) 40 MG tablet TAKE 1 TABLET THREE TIMES WEEKLY 03/25/20   Corky Crafts, MD  Barberry-Oreg Grape-Goldenseal (BERBERINE COMPLEX PO) Take 1 capsule by mouth daily.    [provider]  clopidogrel (PLAVIX) 75 MG tablet TAKE 1 TABLET EVERY DAY 04/28/20   Corky Crafts, MD  ferrous sulfate 325 (65 FE) MG tablet Take 325 mg by mouth daily with breakfast.    [provider]  HAWTHORN BERRY PO Take 1 tablet by mouth daily.    [provider]  HYDROcodone-acetaminophen (NORCO/VICODIN) 5-325 MG tablet Take 1-2 tablets by mouth every 6 (six) hours as needed for moderate pain or severe pain. 05/15/20   Arby Barrette, MD  magnesium gluconate (MAGONATE) 500 MG tablet Take 500 mg by mouth daily.    [provider]  meclizine (ANTIVERT) 25 MG tablet Take 25 mg by mouth daily as needed for dizziness.    [provider]  metoprolol succinate (TOPROL-XL) 25 MG 24 hr tablet Take 1  tablet (25 mg total) by mouth daily. Please keep upcoming appt in January with Dr. Ladona Ridgel before anymore refills. Thank you 03/23/20   Marinus Maw, MD  Misc Natural Products (WHITE WILLOW BARK PO) Take 1 capsule by mouth daily.    [provider]  Multiple Vitamins-Minerals (ANTI-OXIDANT FORMULA PO) Take 1 tablet by mouth daily.    [provider]  multivitamin-lutein (OCUVITE-LUTEIN) CAPS capsule Take 1 capsule by mouth daily.    [provider]  nitroGLYCERIN (NITROSTAT) 0.4 MG SL tablet Place 1 tablet (0.4 mg total) under the tongue every 5 (five) minutes as needed for chest pain. 05/08/18   Corky Crafts, MD  OIL OF OREGANO PO Take 1 capsule by mouth daily.    [provider]  Omega-3 Fatty Acids (FISH OIL) 1000 MG CAPS Take 1,000 mg by mouth daily.    [provider]  ondansetron (ZOFRAN-ODT) 4 MG disintegrating tablet Take  4 mg by mouth every 8 (eight) hours as needed for nausea or vomiting.  04/30/18   [provider]  predniSONE (DELTASONE) 20 MG tablet 3 tabs po day one, then 2 po daily x 4 days 05/15/20   Arby Barrette, MD  promethazine (PHENERGAN) 12.5 MG tablet Take 1 tablet (12.5 mg total) by mouth every 6 (six) hours as needed for up to 7 days for nausea or vomiting. 05/17/18 03/19/20  Briant Cedar, MD  QUERCETIN PO Take 1 tablet by mouth daily.    [provider]  traMADol (ULTRAM) 50 MG tablet Take 50 mg by mouth every 6 (six) hours as needed for moderate pain.     [provider]  Turmeric 500 MG TABS Take 500 mg by mouth daily.    [provider]  valACYclovir (VALTREX) 1000 MG tablet Take 1 tablet (1,000 mg total) by mouth 3 (three) times daily for 14 days. 05/15/20 05/29/20  Arby Barrette, MD  vitamin B-12 (CYANOCOBALAMIN) 1000 MCG tablet Take 1,000 mcg by mouth daily.    [provider]  zinc gluconate 50 MG tablet Take 50 mg by mouth daily.    [provider]     Allergies    Amiodarone and Cymbalta [duloxetine hcl]  Review of Systems   Review of Systems 10 systems reviewed and negative except as per HPI Physical Exam Updated Vital Signs BP (!) 116/95 (BP Location: Left Arm)   Pulse 96   Temp 98.4 F (36.9 C) (Oral)   Resp 18   Ht 5\' 6"  (1.676 m)   Wt 97.1 kg   SpO2 93%   BMI 34.54 kg/m   Physical Exam Constitutional:      Comments: Alert and nontoxic.  Mental status clear.  HENT:     Mouth/Throat:     Pharynx: Oropharynx is clear.  Eyes:     Extraocular Movements: Extraocular movements intact.  Cardiovascular:     Rate and Rhythm: Normal rate and regular rhythm.  Pulmonary:     Effort: Pulmonary effort is normal.     Breath sounds: Normal breath sounds.  Abdominal:     Comments: Abdomen soft without guarding.  Patient erythematous patchy rash with vesicles developing in the low right back wrapping around to the right lower abdomen.  Rash is more dense and pronounced on the back.  It gets lighter on the abdomen.  Musculoskeletal:        General: No swelling or tenderness. Normal range of motion.     Right lower leg: No edema.     Left lower leg: No edema.  Skin:    General: Skin is warm and dry.  Neurological:     General: No focal deficit present.     Mental Status: She is oriented to person, place, and time.     Coordination: Coordination normal.  Psychiatric:        Mood and Affect: Mood normal.     ED Results / Procedures / Treatments   Labs (all labs ordered are listed, but only abnormal results are displayed) Labs Reviewed - No data to display  EKG None  Radiology No results found.  Procedures Procedures (including critical care time)  Medications Ordered in ED Medications - No data to display  ED Course  I have reviewed the triage vital signs and the nursing notes.  Pertinent labs & imaging results that were available during my care of the patient were reviewed by me and considered in my  medical  decision making (see chart for details).    MDM Rules/Calculators/A&P                          Patient presents with shingles rash.  She had right lower abdominal pain preceding this 4 days earlier.  Her CT scan does not show any acute or remarkable findings.  At this time, it appears that her pain was not is secondary to shingles.  Will initiate valacyclovir and prednisone.  Patient counseled on use of Vicodin for pain control.  Return precautions reviewed.  Final Clinical Impression(s) / ED Diagnoses Final diagnoses:  Herpes zoster without complication    Rx / DC Orders ED Discharge Orders         Ordered    valACYclovir (VALTREX) 1000 MG tablet  3 times daily        05/15/20 1134    predniSONE (DELTASONE) 20 MG tablet        05/15/20 1134    HYDROcodone-acetaminophen (NORCO/VICODIN) 5-325 MG tablet  Every 6 hours PRN        05/15/20 1134           Arby BarrettePfeiffer, Keelin Neville, MD 05/15/20 1150

## 2020-05-24 ENCOUNTER — Telehealth: Payer: Self-pay | Admitting: Internal Medicine

## 2020-05-24 NOTE — Telephone Encounter (Signed)
    Victorino Dike PT from [facility], she wanted for Dr. Ladona Ridgel know. Pt is being discharge today

## 2020-05-26 NOTE — Telephone Encounter (Signed)
Returned call to advise had received message.

## 2020-06-01 ENCOUNTER — Inpatient Hospital Stay (HOSPITAL_COMMUNITY): Payer: Medicare Other

## 2020-06-01 ENCOUNTER — Emergency Department (HOSPITAL_COMMUNITY): Payer: Medicare Other

## 2020-06-01 ENCOUNTER — Other Ambulatory Visit: Payer: Self-pay

## 2020-06-01 ENCOUNTER — Inpatient Hospital Stay (HOSPITAL_COMMUNITY)
Admission: EM | Admit: 2020-06-01 | Discharge: 2020-06-01 | DRG: 064 | Disposition: A | Discharge status: Hospice | Payer: Medicare Other | Attending: Neurology | Admitting: Neurology

## 2020-06-01 DIAGNOSIS — Z515 Encounter for palliative care: Secondary | ICD-10-CM

## 2020-06-01 DIAGNOSIS — I442 Atrioventricular block, complete: Secondary | ICD-10-CM | POA: Diagnosis present

## 2020-06-01 DIAGNOSIS — Z7901 Long term (current) use of anticoagulants: Secondary | ICD-10-CM | POA: Diagnosis not present

## 2020-06-01 DIAGNOSIS — G8194 Hemiplegia, unspecified affecting left nondominant side: Secondary | ICD-10-CM | POA: Diagnosis present

## 2020-06-01 DIAGNOSIS — Z950 Presence of cardiac pacemaker: Secondary | ICD-10-CM | POA: Diagnosis not present

## 2020-06-01 DIAGNOSIS — I480 Paroxysmal atrial fibrillation: Secondary | ICD-10-CM | POA: Diagnosis present

## 2020-06-01 DIAGNOSIS — H510 Palsy (spasm) of conjugate gaze: Secondary | ICD-10-CM | POA: Diagnosis present

## 2020-06-01 DIAGNOSIS — Z79899 Other long term (current) drug therapy: Secondary | ICD-10-CM

## 2020-06-01 DIAGNOSIS — G936 Cerebral edema: Secondary | ICD-10-CM | POA: Diagnosis present

## 2020-06-01 DIAGNOSIS — M1990 Unspecified osteoarthritis, unspecified site: Secondary | ICD-10-CM | POA: Diagnosis present

## 2020-06-01 DIAGNOSIS — Z7902 Long term (current) use of antithrombotics/antiplatelets: Secondary | ICD-10-CM | POA: Diagnosis not present

## 2020-06-01 DIAGNOSIS — R4781 Slurred speech: Secondary | ICD-10-CM | POA: Diagnosis present

## 2020-06-01 DIAGNOSIS — I5022 Chronic systolic (congestive) heart failure: Secondary | ICD-10-CM | POA: Diagnosis present

## 2020-06-01 DIAGNOSIS — G914 Hydrocephalus in diseases classified elsewhere: Secondary | ICD-10-CM | POA: Diagnosis present

## 2020-06-01 DIAGNOSIS — I619 Nontraumatic intracerebral hemorrhage, unspecified: Secondary | ICD-10-CM

## 2020-06-01 DIAGNOSIS — Z20822 Contact with and (suspected) exposure to covid-19: Secondary | ICD-10-CM | POA: Diagnosis present

## 2020-06-01 DIAGNOSIS — R29703 NIHSS score 3: Secondary | ICD-10-CM | POA: Diagnosis present

## 2020-06-01 DIAGNOSIS — Z955 Presence of coronary angioplasty implant and graft: Secondary | ICD-10-CM | POA: Diagnosis not present

## 2020-06-01 DIAGNOSIS — E785 Hyperlipidemia, unspecified: Secondary | ICD-10-CM | POA: Diagnosis present

## 2020-06-01 DIAGNOSIS — R270 Ataxia, unspecified: Secondary | ICD-10-CM | POA: Diagnosis present

## 2020-06-01 DIAGNOSIS — I611 Nontraumatic intracerebral hemorrhage in hemisphere, cortical: Principal | ICD-10-CM | POA: Diagnosis present

## 2020-06-01 DIAGNOSIS — Z8249 Family history of ischemic heart disease and other diseases of the circulatory system: Secondary | ICD-10-CM

## 2020-06-01 DIAGNOSIS — R414 Neurologic neglect syndrome: Secondary | ICD-10-CM | POA: Diagnosis present

## 2020-06-01 DIAGNOSIS — I2510 Atherosclerotic heart disease of native coronary artery without angina pectoris: Secondary | ICD-10-CM | POA: Diagnosis present

## 2020-06-01 DIAGNOSIS — I471 Supraventricular tachycardia: Secondary | ICD-10-CM

## 2020-06-01 DIAGNOSIS — Z66 Do not resuscitate: Secondary | ICD-10-CM | POA: Diagnosis present

## 2020-06-01 DIAGNOSIS — I252 Old myocardial infarction: Secondary | ICD-10-CM

## 2020-06-01 LAB — COMPREHENSIVE METABOLIC PANEL
ALT: 16 U/L (ref 0–44)
AST: 24 U/L (ref 15–41)
Albumin: 3.2 g/dL — ABNORMAL LOW (ref 3.5–5.0)
Alkaline Phosphatase: 55 U/L (ref 38–126)
Anion gap: 13 (ref 5–15)
BUN: 30 mg/dL — ABNORMAL HIGH (ref 8–23)
CO2: 18 mmol/L — ABNORMAL LOW (ref 22–32)
Calcium: 8.5 mg/dL — ABNORMAL LOW (ref 8.9–10.3)
Chloride: 104 mmol/L (ref 98–111)
Creatinine, Ser: 0.92 mg/dL (ref 0.44–1.00)
GFR, Estimated: >60 mL/min (ref >60)
Glucose, Bld: 164 mg/dL — ABNORMAL HIGH (ref 70–99)
Potassium: 4.6 mmol/L (ref 3.5–5.1)
Sodium: 135 mmol/L (ref 135–145)
Total Bilirubin: 0.5 mg/dL (ref 0.3–1.2)
Total Protein: 6.2 g/dL — ABNORMAL LOW (ref 6.5–8.1)

## 2020-06-01 LAB — URINALYSIS, COMPLETE (UACMP) WITH MICROSCOPIC
Bilirubin Urine: NEGATIVE
Glucose, UA: NEGATIVE mg/dL
Ketones, ur: NEGATIVE mg/dL
Leukocytes,Ua: NEGATIVE
Nitrite: POSITIVE — AB
Protein, ur: NEGATIVE mg/dL
Specific Gravity, Urine: 1.03 (ref 1.005–1.030)
pH: 5 (ref 5.0–8.0)

## 2020-06-01 LAB — DIFFERENTIAL
Abs Immature Granulocytes: 0.08 10*3/uL — ABNORMAL HIGH (ref 0.00–0.07)
Basophils Absolute: 0 10*3/uL (ref 0.0–0.1)
Basophils Relative: 0 %
Eosinophils Absolute: 0 10*3/uL (ref 0.0–0.5)
Eosinophils Relative: 0 %
Immature Granulocytes: 1 %
Lymphocytes Relative: 5 %
Lymphs Abs: 0.7 10*3/uL (ref 0.7–4.0)
Monocytes Absolute: 0.6 10*3/uL (ref 0.1–1.0)
Monocytes Relative: 5 %
Neutro Abs: 11.5 10*3/uL — ABNORMAL HIGH (ref 1.7–7.7)
Neutrophils Relative %: 89 %

## 2020-06-01 LAB — I-STAT CHEM 8, ED
BUN: 35 mg/dL — ABNORMAL HIGH (ref 8–23)
Calcium, Ion: 1 mmol/L — ABNORMAL LOW (ref 1.15–1.40)
Chloride: 107 mmol/L (ref 98–111)
Creatinine, Ser: 0.7 mg/dL (ref 0.44–1.00)
Glucose, Bld: 158 mg/dL — ABNORMAL HIGH (ref 70–99)
HCT: 35 % — ABNORMAL LOW (ref 36.0–46.0)
Hemoglobin: 11.9 g/dL — ABNORMAL LOW (ref 12.0–15.0)
Potassium: 4.6 mmol/L (ref 3.5–5.1)
Sodium: 135 mmol/L (ref 135–145)
TCO2: 20 mmol/L — ABNORMAL LOW (ref 22–32)

## 2020-06-01 LAB — RESP PANEL BY RT-PCR (FLU A&B, COVID) ARPGX2
Influenza A by PCR: NEGATIVE
Influenza B by PCR: NEGATIVE
SARS Coronavirus 2 by RT PCR: NEGATIVE

## 2020-06-01 LAB — PROTIME-INR
INR: 1.3 — ABNORMAL HIGH (ref 0.8–1.2)
Prothrombin Time: 16.1 seconds — ABNORMAL HIGH (ref 11.4–15.2)

## 2020-06-01 LAB — CBG MONITORING, ED: Glucose-Capillary: 153 mg/dL — ABNORMAL HIGH (ref 70–99)

## 2020-06-01 LAB — CBC
HCT: 33.2 % — ABNORMAL LOW (ref 36.0–46.0)
Hemoglobin: 10.3 g/dL — ABNORMAL LOW (ref 12.0–15.0)
MCH: 28 pg (ref 26.0–34.0)
MCHC: 31 g/dL (ref 30.0–36.0)
MCV: 90.2 fL (ref 80.0–100.0)
Platelets: 294 10*3/uL (ref 150–400)
RBC: 3.68 MIL/uL — ABNORMAL LOW (ref 3.87–5.11)
RDW: 17.4 % — ABNORMAL HIGH (ref 11.5–15.5)
WBC: 12.9 10*3/uL — ABNORMAL HIGH (ref 4.0–10.5)
nRBC: 0.2 % (ref 0.0–0.2)

## 2020-06-01 LAB — APTT: aPTT: 25 seconds (ref 24–36)

## 2020-06-01 MED ORDER — EMPTY CONTAINERS FLEXIBLE MISC
900.0000 mg | Freq: Once | Status: AC
  Administered 2020-06-01: 900 mg via INTRAVENOUS
  Filled 2020-06-01: qty 90

## 2020-06-01 MED ORDER — FENTANYL CITRATE (PF) 100 MCG/2ML IJ SOLN: 50.0000 ug | Freq: Once | INTRAMUSCULAR | Status: DC

## 2020-06-01 MED ORDER — STROKE: EARLY STAGES OF RECOVERY BOOK: Freq: Once | Status: DC

## 2020-06-01 MED ORDER — HALOPERIDOL 1 MG PO TABS: 0.5000 mg | ORAL_TABLET | ORAL | Status: DC | PRN

## 2020-06-01 MED ORDER — MORPHINE SULFATE (CONCENTRATE) 10 MG/0.5ML PO SOLN
5.0000 mg | ORAL | Status: DC | PRN
  Administered 2020-06-01 (×5): 5 mg via SUBLINGUAL
  Filled 2020-06-01 (×5): qty 0.5

## 2020-06-01 MED ORDER — ACETAMINOPHEN 325 MG PO TABS: 650.0000 mg | ORAL_TABLET | ORAL | Status: DC | PRN

## 2020-06-01 MED ORDER — FENTANYL CITRATE (PF) 100 MCG/2ML IJ SOLN
50.0000 ug | Freq: Once | INTRAMUSCULAR | Status: AC
  Administered 2020-06-01: 50 ug via INTRAVENOUS
  Filled 2020-06-01: qty 2

## 2020-06-01 MED ORDER — HALOPERIDOL LACTATE 2 MG/ML PO CONC
0.5000 mg | ORAL | Status: DC | PRN
  Filled 2020-06-01: qty 0.3

## 2020-06-01 MED ORDER — CLEVIDIPINE BUTYRATE 0.5 MG/ML IV EMUL
INTRAVENOUS | Status: AC
  Administered 2020-06-01: 0.5 mg
  Filled 2020-06-01: qty 50

## 2020-06-01 MED ORDER — FENTANYL CITRATE (PF) 100 MCG/2ML IJ SOLN
25.0000 ug | Freq: Once | INTRAMUSCULAR | Status: AC
  Administered 2020-06-01: 25 ug via INTRAVENOUS
  Filled 2020-06-01: qty 2

## 2020-06-01 MED ORDER — LORAZEPAM 1 MG PO TABS: 1.0000 mg | ORAL_TABLET | ORAL | Status: DC | PRN

## 2020-06-01 MED ORDER — ONDANSETRON 4 MG PO TBDP: 4.0000 mg | ORAL_TABLET | Freq: Four times a day (QID) | ORAL | Status: DC | PRN

## 2020-06-01 MED ORDER — MORPHINE SULFATE (PF) 2 MG/ML IV SOLN
1.0000 mg | INTRAVENOUS | Status: DC | PRN
  Filled 2020-06-01: qty 1

## 2020-06-01 MED ORDER — LABETALOL HCL 5 MG/ML IV SOLN
INTRAVENOUS | Status: AC
  Filled 2020-06-01: qty 4

## 2020-06-01 MED ORDER — ONDANSETRON HCL 4 MG/2ML IJ SOLN
4.0000 mg | Freq: Four times a day (QID) | INTRAMUSCULAR | Status: DC | PRN
  Administered 2020-06-01: 4 mg via INTRAVENOUS
  Filled 2020-06-01: qty 2

## 2020-06-01 MED ORDER — ACETAMINOPHEN 650 MG RE SUPP: 650.0000 mg | RECTAL | Status: DC | PRN

## 2020-06-01 MED ORDER — ATROPINE SULFATE 1 % OP SOLN
4.0000 [drp] | OPHTHALMIC | Status: DC | PRN
  Administered 2020-06-01: 4 [drp] via SUBLINGUAL
  Filled 2020-06-01: qty 2

## 2020-06-01 MED ORDER — CHLORHEXIDINE GLUCONATE 0.12 % MT SOLN
15.0000 mL | Freq: Two times a day (BID) | OROMUCOSAL | Status: DC
  Filled 2020-06-01 (×2): qty 15

## 2020-06-01 MED ORDER — BIOTENE DRY MOUTH MT LIQD: 15.0000 mL | OROMUCOSAL | Status: DC | PRN

## 2020-06-01 MED ORDER — LORAZEPAM 2 MG/ML IJ SOLN: 1.0000 mg | INTRAMUSCULAR | Status: DC | PRN

## 2020-06-01 MED ORDER — SODIUM CHLORIDE 0.9 % IV SOLN: INTRAVENOUS | Status: DC

## 2020-06-01 MED ORDER — FENTANYL CITRATE (PF) 100 MCG/2ML IJ SOLN
INTRAMUSCULAR | Status: AC
  Administered 2020-06-01: 50 ug via INTRAVENOUS
  Filled 2020-06-01: qty 2

## 2020-06-01 MED ORDER — HALOPERIDOL LACTATE 5 MG/ML IJ SOLN: 0.5000 mg | INTRAMUSCULAR | Status: DC | PRN

## 2020-06-01 MED ORDER — LORAZEPAM 2 MG/ML PO CONC: 1.0000 mg | ORAL | Status: DC | PRN

## 2020-06-01 MED ORDER — FENTANYL CITRATE (PF) 100 MCG/2ML IJ SOLN
50.0000 ug | INTRAMUSCULAR | Status: DC | PRN
  Administered 2020-06-01 (×3): 50 ug via INTRAVENOUS
  Filled 2020-06-01 (×3): qty 2

## 2020-06-01 MED ORDER — POLYVINYL ALCOHOL 1.4 % OP SOLN
1.0000 [drp] | Freq: Four times a day (QID) | OPHTHALMIC | Status: DC | PRN
  Administered 2020-06-01: 1 [drp] via OPHTHALMIC
  Filled 2020-06-01: qty 15

## 2020-06-01 MED ORDER — MORPHINE SULFATE (CONCENTRATE) 10 MG/0.5ML PO SOLN: 5.0000 mg | ORAL | Status: DC | PRN

## 2020-06-01 MED ORDER — ACETAMINOPHEN 160 MG/5ML PO SOLN: 650.0000 mg | ORAL | Status: DC | PRN

## 2020-06-01 MED ORDER — CLEVIDIPINE BUTYRATE 0.5 MG/ML IV EMUL: 0.0000 mg/h | INTRAVENOUS | Status: DC

## 2020-06-01 MED ORDER — SENNOSIDES-DOCUSATE SODIUM 8.6-50 MG PO TABS: 1.0000 | ORAL_TABLET | Freq: Two times a day (BID) | ORAL | Status: DC

## 2020-06-01 MED ORDER — PANTOPRAZOLE SODIUM 40 MG IV SOLR: 40.0000 mg | Freq: Every day | INTRAVENOUS | Status: DC

## 2020-06-01 MED ORDER — LABETALOL HCL 5 MG/ML IV SOLN
INTRAVENOUS | Status: AC | PRN
  Administered 2020-06-01 (×2): 10 mg via INTRAVENOUS

## 2020-06-01 MED ORDER — SODIUM CHLORIDE 0.9% FLUSH
3.0000 mL | Freq: Once | INTRAVENOUS | Status: DC
Start: 2020-06-01 — End: 2020-06-02

## 2020-06-01 MED ORDER — ORAL CARE MOUTH RINSE
15.0000 mL | Freq: Two times a day (BID) | OROMUCOSAL | Status: DC
  Administered 2020-06-01: 15 mL via OROMUCOSAL

## 2020-06-01 MED ORDER — LORAZEPAM 2 MG/ML IJ SOLN
1.0000 mg | INTRAMUSCULAR | Status: DC | PRN
  Administered 2020-06-01 (×2): 1 mg via INTRAVENOUS
  Filled 2020-06-01 (×2): qty 1

## 2020-06-01 MED ORDER — CHLORHEXIDINE GLUCONATE CLOTH 2 % EX PADS: 6.0000 | MEDICATED_PAD | Freq: Every day | CUTANEOUS | Status: DC

## 2020-06-01 NOTE — ED Notes (Signed)
Daughter, Kathrine Cords, Requesting to be notified when pt is leaving the hospital to go to facility. Number is in the chart.

## 2020-06-01 NOTE — ED Notes (Signed)
Family requesting that Vital signs not be taken on Pt. Daughter has requested "a stronger medication, she is miserable". MD aware.

## 2020-06-01 NOTE — ED Notes (Signed)
Pulse ox removed by daughter and states pt does not need it at this time.

## 2020-06-01 NOTE — ED Notes (Signed)
pts daughter at bedside states she would like to speak with provider, as pt is comfort care. Dr. Thomasena Edis aware.

## 2020-06-01 NOTE — ED Provider Notes (Signed)
[facility] EMERGENCY DEPARTMENT Provider Note   CSN: [id number] Arrival date & time: 06/01/20  8270  An emergency department physician performed an initial assessment on this suspected stroke patient at 0035.  History No chief complaint on file.   [name] is a 86 y.o. female.  HPI      86 year old female with history of systolic CHF, chronic CAD, SVT, complete heart block, hyperlipidemia, paroxysmal atrial fibrillation on Eliquis who presented from home as a code stroke.  Per [facility], patient was walking to the bathroom and was found by family on the floor with a right-sided gaze, left-sided weakness and headache.  At time of my evaluation, she has had CT scan and reports headache, weakness to the left side.  Reports that she was going to the bathroom.  Her last dose of Eliquis was this morning.  Past Medical History:  Diagnosis Date  . Acute systolic heart failure (HCC)   . Arthritis   . CHF (congestive heart failure) (HCC)   . Coronary artery disease   . Hyperlipidemia   . PAF (paroxysmal atrial fibrillation) (HCC)   . STEMI (ST elevation myocardial infarction) (HCC)    04/04/18 PCI/DES to the pLAD, 95% dRCA (possible staged intervention), 75% mLAD, EF 45%    Patient Active Problem List   Diagnosis Date Noted  . Stroke, hemorrhagic (HCC) 06/01/2020  . Complete heart block (HCC) 03/21/2020  . Exertional chest pain   . SVT (supraventricular tachycardia) (HCC) 03/19/2020  . Chronic systolic (congestive) heart failure (HCC) 08/15/2018  . Old MI (myocardial infarction) 08/15/2018  . Chronic bilateral low back pain without sciatica 06/06/2018  . CKD (chronic kidney disease) stage 3, GFR 30-59 ml/min (HCC) 05/15/2018  . Orthostatic hypotension 05/15/2018  . CAD (coronary artery disease) 05/15/2018  . Dehydration 05/15/2018  . AKI (acute kidney injury) (HCC) 05/14/2018  . Nausea & vomiting 05/14/2018  . CAD S/P percutaneous coronary  angioplasty 05/06/2018  . Ischemic cardiomyopathy 05/06/2018  . Chronic systolic heart failure (HCC) 05/06/2018  . Acute CHF (congestive heart failure) (HCC) 04/12/2018  . Acute on chronic combined systolic and diastolic congestive heart failure (HCC) 04/12/2018  . PAF (paroxysmal atrial fibrillation) (HCC) 04/08/2018  . Acute systolic heart failure (HCC) 04/08/2018  . HLD (hyperlipidemia) 04/08/2018  . Acute anterior wall MI (HCC) 04/04/2018  . 1st degree AV block 01/22/2017  . Chronic right shoulder pain 01/22/2017  . Lichen sclerosus of female genitalia 01/22/2017  . Prediabetes 01/22/2017  . Vitamin D deficiency 12/14/2016  . Cholelithiasis with acute or chronic cholecystitis 08/10/2011  . Obesity (BMI 30.0-34.9) 08/10/2011    Past Surgical History:  Procedure Laterality Date  . CHOLECYSTECTOMY    . CHOLECYSTECTOMY  08/09/2011   Procedure: LAPAROSCOPIC CHOLECYSTECTOMY;  Surgeon: Robyne Askew, MD;  Location: [facility] ORS;  Service: General;  Laterality: N/A;  . CORONARY/GRAFT ACUTE MI REVASCULARIZATION N/A 04/04/2018   Procedure: Coronary/Graft Acute MI Revascularization;  Surgeon: Corky Crafts, MD;  Location: [facility] INVASIVE CV LAB;  Service: Cardiovascular;  Laterality: N/A;  . HERNIA REPAIR    . LEFT HEART CATH AND CORONARY ANGIOGRAPHY N/A 04/04/2018   Procedure: LEFT HEART CATH AND CORONARY ANGIOGRAPHY;  Surgeon: Corky Crafts, MD;  Location: [facility] INVASIVE CV LAB;  Service: Cardiovascular;  Laterality: N/A;  . LEFT HEART CATH AND CORONARY ANGIOGRAPHY N/A 03/21/2020   Procedure: LEFT HEART CATH AND CORONARY ANGIOGRAPHY;  Surgeon: Swazil[place], Peter M, MD;  Location: [facility] INVASIVE CV LAB;  Service: Cardiovascular;  Laterality: N/A;  . PACEMAKER IMPLANT N/A 03/22/2020   Procedure: PACEMAKER IMPLANT;  Surgeon: Marinus Mawaylor, Gregg W, MD;  [facility] CV LAB;  Service: Cardiovascular;  Laterality: N/A;  . SVT ABLATION N/A 03/21/2020   Procedure: SVT ABLATION;  Surgeon: Marinus Mawaylor, Gregg  W, MD;  Location: [facility] INVASIVE CV LAB;  Service: Cardiovascular;  Laterality: N/A;  . TUBAL LIGATION       OB History   No obstetric history on file.     Family History  Problem Relation Age of Onset  . Hypertension Mother     Social History   Tobacco Use  . Smoking status: Never Smoker  . Smokeless tobacco: Never Used  Vaping Use  . Vaping Use: Never used  Substance Use Topics  . Alcohol use: No  . Drug use: No    Home Medications Prior to Admission medications   Medication Sig Start Date End Date Taking? Authorizing Provider  acetaminophen (TYLENOL) 500 MG tablet Take 1,000 mg by mouth every 4 (four) hours as needed for mild pain.     [provider]  apixaban (ELIQUIS) 5 MG TABS tablet Take 1 tablet (5 mg total) by mouth 2 (two) times daily. 06/04/18   Robbie LisSimmons, Brittainy M, PA-C  Ascorbic Acid (VITAMIN C) 1000 MG tablet Take 500 mg by mouth daily.    [provider]  ASTAXANTHIN PO Take 1 tablet by mouth daily.    [provider]  atorvastatin (LIPITOR) 40 MG tablet TAKE 1 TABLET THREE TIMES WEEKLY 03/25/20   Corky CraftsVaranasi, Jayadeep S, MD  Barberry-Oreg Grape-Goldenseal (BERBERINE COMPLEX PO) Take 1 capsule by mouth daily.    [provider]  clopidogrel (PLAVIX) 75 MG tablet TAKE 1 TABLET EVERY DAY 04/28/20   Corky CraftsVaranasi, Jayadeep S, MD  ferrous sulfate 325 (65 FE) MG tablet Take 325 mg by mouth daily with breakfast.    [provider]  HAWTHORN BERRY PO Take 1 tablet by mouth daily.    [provider]  HYDROcodone-acetaminophen (NORCO/VICODIN) 5-325 MG tablet Take 1-2 tablets by mouth every 6 (six) hours as needed for moderate pain or severe pain. 05/15/20   Arby BarrettePfeiffer, Marcy, MD  magnesium gluconate (MAGONATE) 500 MG tablet Take 500 mg by mouth daily.    [provider]  meclizine (ANTIVERT) 25 MG tablet Take 25 mg by mouth daily as needed for dizziness.    [provider]  metoprolol succinate (TOPROL-XL) 25 MG  24 hr tablet Take 1 tablet (25 mg total) by mouth daily. Please keep upcoming appt in January with Dr. Ladona Ridgelaylor before anymore refills. Thank you 03/23/20   Marinus Mawaylor, Gregg W, MD  Misc Natural Products (WHITE WILLOW BARK PO) Take 1 capsule by mouth daily.    [provider]  Multiple Vitamins-Minerals (ANTI-OXIDANT FORMULA PO) Take 1 tablet by mouth daily.    [provider]  multivitamin-lutein (OCUVITE-LUTEIN) CAPS capsule Take 1 capsule by mouth daily.    [provider]  nitroGLYCERIN (NITROSTAT) 0.4 MG SL tablet Place 1 tablet (0.4 mg total) under the tongue every 5 (five) minutes as needed for chest pain. 05/08/18   Corky CraftsVaranasi, Jayadeep S, MD  OIL OF OREGANO PO Take 1 capsule by mouth daily.    [provider]  Omega-3 Fatty Acids (FISH OIL) 1000 MG CAPS Take 1,000 mg by mouth daily.    [provider]  ondansetron (ZOFRAN-ODT) 4 MG disintegrating tablet Take 4 mg by mouth every 8 (eight) hours as needed for nausea or vomiting.  04/30/18   [provider]  predniSONE (DELTASONE) 20 MG tablet 3 tabs po day one, then 2 po daily x 4 days 05/15/20   Arby Barrette, MD  promethazine (PHENERGAN) 12.5 MG tablet Take 1 tablet (12.5 mg total) by mouth every 6 (six) hours as needed for up to 7 days for nausea or vomiting. 05/17/18 03/19/20  Briant Cedar, MD  QUERCETIN PO Take 1 tablet by mouth daily.    [provider]  traMADol (ULTRAM) 50 MG tablet Take 50 mg by mouth every 6 (six) hours as needed for moderate pain.     [provider]  Turmeric 500 MG TABS Take 500 mg by mouth daily.    [provider]  vitamin B-12 (CYANOCOBALAMIN) 1000 MCG tablet Take 1,000 mcg by mouth daily.    [provider]  zinc gluconate 50 MG tablet Take 50 mg by mouth daily.    [provider]    Allergies    Amiodarone and Cymbalta [duloxetine hcl]  Review of Systems   Review of Systems  Unable to perform ROS: Acuity of  condition  Skin: Negative for rash.  Neurological: Positive for weakness and headaches.    Physical Exam Updated Vital Signs BP 122/80   Pulse 96   Temp 97.8 F (36.6 C) (Oral)   Resp 17   Ht 5\' 6"  (1.676 m)   Wt 95.9 kg   SpO2 95%   BMI 34.12 kg/m   Physical Exam Vitals and nursing note reviewed.  Constitutional:      General: She is not in acute distress.    Appearance: She is ill-appearing. She is not toxic-appearing or diaphoretic.  HENT:     Head: Normocephalic.  Eyes:     Conjunctiva/sclera: Conjunctivae normal.  Cardiovascular:     Rate and Rhythm: Normal rate and regular rhythm.     Pulses: Normal pulses.  Pulmonary:     Effort: Pulmonary effort is normal. No respiratory distress.  Musculoskeletal:        General: No deformity or signs of injury.     Cervical back: No rigidity.  Skin:    General: Skin is warm and dry.     Coloration: Skin is not jaundiced or pale.  Neurological:     Mental Status: She is alert and oriented to person, place, and time.     Comments: Left facial droop, left upper and lower extremity weakness Midline tongue Right gaze preference Eyes closed and appears sleepy/ill appearing but answers questions readily     ED Results / Procedures / Treatments   Labs (all labs ordered are listed, but only abnormal results are displayed) Labs Reviewed  PROTIME-INR - Abnormal; Notable for the following components:      Result Value   Prothrombin Time 16.1 (*)    INR 1.3 (*)    All other components within normal limits  CBC - Abnormal; Notable for the following components:   WBC 12.9 (*)    RBC 3.68 (*)    Hemoglobin 10.3 (*)    HCT 33.2 (*)    RDW 17.4 (*)    All other components within normal limits  DIFFERENTIAL - Abnormal; Notable for the following components:   Neutro Abs 11.5 (*)    Abs Immature Granulocytes 0.08 (*)    All other components within normal limits  COMPREHENSIVE METABOLIC PANEL - Abnormal; Notable for the  following components:   CO2 18 (*)    Glucose, Bld 164 (*)  BUN 30 (*)    Calcium 8.5 (*)    Total Protein 6.2 (*)    Albumin 3.2 (*)    All other components within normal limits  I-STAT CHEM 8, ED - Abnormal; Notable for the following components:   BUN 35 (*)    Glucose, Bld 158 (*)    Calcium, Ion 1.00 (*)    TCO2 20 (*)    Hemoglobin 11.9 (*)    HCT 35.0 (*)    All other components within normal limits  CBG MONITORING, ED - Abnormal; Notable for the following components:   Glucose-Capillary 153 (*)    All other components within normal limits  RESP PANEL BY RT-PCR (FLU A&B, COVID) ARPGX2  MRSA PCR SCREENING  APTT  COMPREHENSIVE METABOLIC PANEL  PROTIME-INR  APTT  LIPID PANEL  BLOOD GAS, ARTERIAL  HEMOGLOBIN A1C  URINALYSIS, COMPLETE (UACMP) WITH MICROSCOPIC  SODIUM  SODIUM  SODIUM  TYPE AND SCREEN    EKG None  Radiology CT HEAD CODE STROKE WO CONTRAST  Result Date: 06/01/2020 CLINICAL DATA:  Code stroke. Initial evaluation for acute stroke, left-sided weakness with right gaze. EXAM: CT HEAD WITHOUT CONTRAST TECHNIQUE: Contiguous axial images were obtained from the base of the skull through the vertex without intravenous contrast. COMPARISON:  Prior head CT from 03/19/2020. FINDINGS: Brain: Large intraparenchymal hemorrhage measuring 4.9 x 7.5 x 5.2 cm (estimated volume 96 cc) seen centered at the right parieto-occipital region. Surrounding vasogenic edema with regional mass effect. Right lateral ventricle is partially effaced. Associated right-to-left midline shift of up to 3 mm. Associated intraventricular extension with blood seen within both lateral ventricles. Increased lateral ventriculomegaly as compared to previous, consistent with a degree of hydrocephalus. No other acute intracranial hemorrhage. No other acute large vessel territory infarct. Underlying atrophy with chronic small vessel ischemic disease. Basilar cisterns remain patent. No extra-axial fluid  collection. Vascular: No hyperdense vessel. Skull: Scalp soft tissues within normal limits.  Calvarium intact. Sinuses/Orbits: Globes and orbital soft tissues demonstrate no acute finding. Paranasal sinuses and mastoid air cells are clear. Other: None. ASPECTS [facility] Stroke Program Early CT Score) Acute intracranial hemorrhage, does not apply. IMPRESSION: 1. 4.9 x 7.5 x 5.2 cm (estimated volume 96 cc) intraparenchymal hemorrhage centered at the right parieto-occipital region, with associated vasogenic edema and regional mass effect and 3 mm of right-to-left shift. Associated intraventricular extension with blood within both lateral ventricles. Increased lateral ventriculomegaly consistent with hydrocephalus. 2. Underlying atrophy with chronic small vessel ischemic disease. These results were communicated to Dr. Thomasena Edis at 1:02 amon 12/8/2021by text page via the [facility] messaging system. Electronically Signed   By: Rise Mu M.D.   On: 06/01/2020 01:03    Procedures .Critical Care Performed by: Alvira Monday, MD Authorized by: Alvira Monday, MD   Critical care provider statement:    Critical care time (minutes):  45   Critical care was time spent personally by me on the following activities:  Discussions with consultants, evaluation of patient's response to treatment, examination of patient, ordering and performing treatments and interventions, ordering and review of laboratory studies, ordering and review of radiographic studies, pulse oximetry, re-evaluation of patient's condition, obtaining history from patient or surrogate and review of old charts   (including critical care time)  Medications Ordered in ED Medications  sodium chloride flush (NS) 0.9 % injection 3 mL (3 mLs Intravenous Not Given 06/01/20 0424)   stroke: mapping our early stages of recovery book (has no administration in time range)  acetaminophen (TYLENOL)  tablet 650 mg (has no administration in time range)    Or   acetaminophen (TYLENOL) 160 MG/5ML solution 650 mg (has no administration in time range)    Or  acetaminophen (TYLENOL) suppository 650 mg (has no administration in time range)  senna-docusate (Senokot-S) tablet 1 tablet (1 tablet Oral Not Given 06/01/20 0425)  pantoprazole (PROTONIX) injection 40 mg (has no administration in time range)  0.9 %  sodium chloride infusion (has no administration in time range)  clevidipine (CLEVIPREX) infusion 0.5 mg/mL (4 mg/hr Intravenous Rate/Dose Change 06/01/20 0134)  fentaNYL (SUBLIMAZE) injection 50 mcg (50 mcg Intravenous Given 06/01/20 0425)  chlorhexidine (PERIDEX) 0.12 % solution 15 mL (has no administration in time range)  MEDLINE mouth rinse (has no administration in time range)  Chlorhexidine Gluconate Cloth 2 % PADS 6 each (has no administration in time range)  coag fact Xa recombinant (ANDEXXA) low dose infusion 900 mg (0 mg Intravenous Stopped 06/01/20 0320)  clevidipine (CLEVIPREX) 0.5 MG/ML infusion (2 mg/hr  Rate/Dose Change 06/01/20 0129)  labetalol (NORMODYNE) injection ( Intravenous Canceled Entry 06/01/20 0115)  fentaNYL (SUBLIMAZE) injection 25 mcg (25 mcg Intravenous Given 06/01/20 0150)  fentaNYL (SUBLIMAZE) injection 25 mcg (25 mcg Intravenous Given 06/01/20 0213)  fentaNYL (SUBLIMAZE) injection 50 mcg (50 mcg Intravenous Given 06/01/20 1610)    ED Course  I have reviewed the triage vital signs and the nursing notes.  Pertinent labs & imaging results that were available during my care of the patient were reviewed by me and considered in my medical decision making (see chart for details).    MDM Rules/Calculators/A&P                          86 year old female with history of systolic CHF, chronic CAD, SVT, complete heart block, hyperlipidemia, paroxysmal atrial fibrillation on Eliquis who presented from home as a code stroke with left sided weakness.  CT shows large intraparenchymal hemorrhage at the right parieto-occipital region with  associated vasogenic edema and regional mass effect, 3mm of shift, intraventricular extension with blood within both lateral ventricles, increased ventriculomentaly consistent with hydrocephalus.  Given andexxa, labetalol and cleviprex. Consulted NSU who reports no NSU intervention indicated.  Discussed with daughter and notified husband to come to the hospital as well. Dr. Thomasena Edis having continued discussion of goals.    Final Clinical Impression(s) / ED Diagnoses Final diagnoses:  Intraparenchymal hemorrhage of brain [facility])    Rx / DC Orders ED Discharge Orders    None       Alvira Monday, MD 06/01/20 [id number]

## 2020-06-01 NOTE — Progress Notes (Signed)
SLP Cancellation Note  Patient Details Name: DRUE CAMERA MRN: [id number] DOB: [birth year]   Cancelled treatment:       Reason Eval/Treat Not Completed: Other (comment). Given plan for comfort measures, will d/c SLP orders at this time.    Gurfateh Mcclain, Riley Nearing 06/01/2020, 9:42 AM

## 2020-06-01 NOTE — Code Documentation (Signed)
Pt to CT with neurologist, this RN and Phyllis Ginger at bedside.

## 2020-06-01 NOTE — Discharge Planning (Signed)
RNCM consulted [facility] liaison [place]) regarding inpatient bed for pt at request of family.  Leandro Reasoner will contact family Burnetta Sabin) and proceed with their wishes.

## 2020-06-01 NOTE — ED Notes (Signed)
Patient placement and Enrique Sack RN (of [facility]) aware of pts status.

## 2020-06-01 NOTE — ED Notes (Signed)
Pt daughter removed pulse ox from pt finger and turned monitor off. This NT went in to get vitals and daughter refused another blood pressure to be taken. Most recent BP from 0600 was used, other VS were current. Pt daughter requested pulse ox be removed and monitor be turned back off again. This NT removed pulse ox and turned monitor off.

## 2020-06-01 NOTE — ED Notes (Signed)
Ativan given per daughter request "to help her relax"

## 2020-06-01 NOTE — Code Documentation (Signed)
Neurologist arrived at bedside - Dr. Thomasena Edis.

## 2020-06-01 NOTE — ED Notes (Signed)
Mouth care performed. Morphine given per daughters request.

## 2020-06-01 NOTE — ED Notes (Signed)
MD Thomasena Edis paged regarding pt dispo to Hospice facility

## 2020-06-01 NOTE — ED Notes (Signed)
cleviprex increased to 2mg  at this time.

## 2020-06-01 NOTE — ED Notes (Signed)
Attempted x 1  

## 2020-06-01 NOTE — ED Notes (Signed)
Pt transported to [facility] via [place]. [facility] and family made aware of departure from hospital.

## 2020-06-01 NOTE — ED Notes (Signed)
cleviprex started at this time.

## 2020-06-01 NOTE — ED Notes (Signed)
Dr. Thomasena Edis made aware of need to change bed status to palliative care/ comfort care and about cleviprex drip order has to be stopped.

## 2020-06-01 NOTE — Progress Notes (Signed)
Patient and family refused ABG. RN aware

## 2020-06-01 NOTE — ED Notes (Signed)
Blood sugar 137mg /dl.

## 2020-06-01 NOTE — ED Notes (Signed)
Report called to [facility].

## 2020-06-01 NOTE — ED Notes (Signed)
Daughter helping patient visit with family via face time. States "she is finally more comfortable".

## 2020-06-01 NOTE — ED Notes (Signed)
Family requesting for pt to have some more pain medication due to increase extremity activity and groaning.

## 2020-06-01 NOTE — ED Notes (Signed)
Family requesting that no vitals to be taken on the pt. Their only request is to keep the pt comfortable.

## 2020-06-01 NOTE — ED Notes (Signed)
Pt is finally resting. Purewick is draining. Will wait until patient awakens to place foley. Daughter is in agreement with this.

## 2020-06-01 NOTE — Code Documentation (Signed)
Stroke Response Nurse Documentation Code Documentation  VERBIE BABIC is a 86 y.o. female arriving to [facility] ED via [facility] on 12/8 with past medical hx of Afib on Eliquis, CHF, HLD. Code stroke was activated by EMS. Patient from home where she was LKW at 2300 and now complaining of right gaze and left sided weakness . On Eliquis (apixaban) daily. Stroke team at the bedside on patient arrival. Labs drawn and patient cleared for CT by Dr. Dalene Seltzer. Patient to CT with team. NIHSS 10, see documentation for details and code stroke times. Patient with right gaze preference , left hemianopia, left arm weakness, left leg weakness, dysarthria  and left neglect on exam. The following imaging was completed:  CT. Patient is not a candidate for tPA due to ICH. Bedside handoff with ED RN Durward Mallard.    Rose Fillers  Rapid Response RN

## 2020-06-01 NOTE — Code Documentation (Signed)
RRT Onalee Hua arrived at bedside.

## 2020-06-01 NOTE — Progress Notes (Signed)
    Providing Compassionate, Quality Care - Together   BP 121/79 (BP Location: Left Arm)   Pulse 79   Temp 97.8 F (36.6 C) (Oral)   Resp (!) 22   Ht 5\' 6"  (1.676 m)   Wt 95.9 kg   SpO2 92%   BMI 34.12 kg/m    Vital signs reviewed  Patient with large right-sided intraparenchymal hemorrhage. Unfortunately, no Neurosurgical intervention is recommended. Agree with reversal of Eliquis and blood pressure control. Full consult note to follow later this morning.  06/01/2020 1:56 AM

## 2020-06-01 NOTE — ED Triage Notes (Signed)
Arrived per [facility] from home, pt was walking to the bathroom - family found pt on the floor with right sided gaze, left sided weakness, headache (4/10) slurred speech and unable to recognize objects for EMS.    Pt is on eliquis, last dose was 05/31/2020 (morning) .

## 2020-06-01 NOTE — Progress Notes (Signed)
STROKE TEAM PROGRESS NOTE   HISTORY OF PRESENT ILLNESS (per record) [name] is a 86 y.o. female Hx systolic CHF, chronic CAD, SVT, complete heart block, hyperlipidemia, paroxysmal atrial fibrillation on Eliquiswas walking to the bathroom and fell. Her family found her on the floor with right sided gaze, left sided weakness, slurred speech and called EMS who noted headache and not able to name objects and was neglecting her left side. On arrival, the patient was complaining of headache and was able to answer questions consistently.  Denies hitting her head. Headache described as occipital headache (5-6/10) radiating anteriorly with associated nausea. Denies dizziness, hearing loss, fever, chills. LKW: 2300 tpa given?: No, ICH IR Thrombectomy? No, ICH Modified Rankin Scale: 0-Completely asymptomatic and back to baseline post- stroke NIH Stroke Scale/Score (NIHSS) 3   INTERVAL HISTORY Her daughter   is at the bedside.   Patient is drowsy but can be aroused and follows simple commands.  She has left hemineglect and left hemiplegia with right gaze deviation.  Blood pressure is adequately controlled.  CT scan of the head shows a large 4.9 x 7.5 x 5.2 cm estimated volume 96 cubic cc right parieto-occipital parenchymal hemorrhage with vasogenic edema and regional mass-effect with effacement of lateral ventricle and 3 mm midline shift    OBJECTIVE Vitals:   06/01/20 0530 06/01/20 0545 06/01/20 0600 06/01/20 0645  BP: 130/64 (!) 142/76 (!) 148/65 (!) 148/65  Pulse:    (!) 102  Resp: (!) 21 15 19 18   Temp:      TempSrc:      SpO2:   95% (!) 86%  Weight:      Height:        CBC:  Recent Labs  Lab 06/01/20 0041 06/01/20 0045  WBC 12.9*  --   NEUTROABS 11.5*  --   HGB 10.3* 11.9*  HCT 33.2* 35.0*  MCV 90.2  --   PLT 294  --     Basic Metabolic Panel:  Recent Labs  Lab 06/01/20 0041 06/01/20 0045  NA 135 135  K 4.6 4.6  CL 104 107  CO2 18*  --   GLUCOSE 164* 158*  BUN  30* 35*  CREATININE 0.92 0.70  CALCIUM 8.5*  --     Lipid Panel:     Component Value Date/Time   CHOL 116 03/20/2020 0314   TRIG 52 03/20/2020 0314   HDL 52 03/20/2020 0314   CHOLHDL 2.2 03/20/2020 0314   VLDL 10 03/20/2020 0314   LDLCALC 54 03/20/2020 0314   HgbA1c:  Lab Results  Component Value Date   HGBA1C 6.2 (H) 03/19/2020   Urine Drug Screen: No results found for: LABOPIA, COCAINSCRNUR, LABBENZ, AMPHETMU, THCU, LABBARB  Alcohol Level No results found for: ETH  IMAGING  CT HEAD CODE STROKE WO CONTRAST 06/01/2020 IMPRESSION:  1. 4.9 x 7.5 x 5.2 cm (estimated volume 96 cc) intraparenchymal hemorrhage centered at the right parieto-occipital region, with associated vasogenic edema and regional mass effect and 3 mm of right-to-left shift. Associated intraventricular extension with blood within both lateral ventricles. Increased lateral ventriculomegaly consistent with hydrocephalus.  2. Underlying atrophy with chronic small vessel ischemic disease.    ECG - ST rate 105 BPM. (See cardiology reading for complete details)  PHYSICAL EXAM Blood pressure (!) 148/65, pulse (!) 102, temperature 97.8 F (36.6 C), temperature source Oral, resp. rate 18, height 5\' 6"  (1.676 m), weight 95.9 kg, SpO2 (!) 86 %. Elderly Caucasian lady is sitting up in bed.  She is in mild respiratory distress. . Afebrile. Head is nontraumatic. Neck is supple without bruit.    Cardiac exam no murmur or gallop. Lungs are clear to auscultation. Distal pulses are well felt. Neurological Exam : She is drowsy but can arouse.  She has left gaze palsy and right gaze preference.  She is cannot look past midline.  Blinks to threat on the right but not on the left.  Speech is dysarthric but can be understood.  Tongue is midline.  Dense left hemiplegia with left hemineglect and only trace withdrawal to painful stimuli.  Purposeful antigravity movements on the right.  ASSESSMENT/PLAN [name] is a 86 y.o.  female with history of systolic CHF, chronic CAD, SVT, complete heart block, PPM, hyperlipidemia, paroxysmal atrial fibrillation on Eliquispresenting after she was found on the floor having fallen by family with right sided gaze, left sided weakness, slurred speech, headache, nausea and unable to name objects.  She did not receive IV t-PA due to ICH  ICH: 4.9 x 7.5 x 5.2 cm (estimated volume 96 cc) intraparenchymal hemorrhage centered at the right parieto-occipital region.  Resultant left hemiplegia, left hemineglect, left gaze palsy and left field cut  Code Stroke CT Head - not ordered   CT head -  4.9 x 7.5 x 5.2 cm (estimated volume 96 cc) intraparenchymal hemorrhage centered at the right parieto-occipital region, with associated vasogenic edema and regional mass effect and 3 mm of right-to-left shift. Associated intraventricular extension with blood within both lateral ventricles. Increased lateral ventriculomegaly consistent with hydrocephalus.   MRI head - not ordered  MRA head - not ordered  CTA H&N - not ordered  CT Perfusion - not ordered  Carotid Doppler - not indicated  2D Echo - not indicated  [employer] Virus 2 - negative  LDL - pending  HgbA1c - pending  UDS - not ordered  VTE prophylaxis - SCDs Diet  Diet Order            Diet NPO time specified  Diet effective now                 Eliquis and Plavix prior to admission, now on No antithrombotic  Ongoing aggressive stroke risk factor management  Therapy recommendations:  pending  Disposition:  Pending  Hypertension  Home BP meds: Toprol  Current BP meds: none  Stable . SBP goal < 140 mm Hg for the first 24 hours then < 160 mm Hg. . Long-term BP goal normotensive  Hyperlipidemia  Home Lipid lowering medication: Lipitor 40 mg daily  LDL pending, goal < 70  Current lipid lowering medication: None (statin contraindicated with ICH)  Continue statin at discharge  Other Stroke Risk  Factors  Advanced age  Coronary artery disease  Congestive Heart Failure  Atrial fibrillation on Eliquis PTA -> Andexxa  Other Active Problems  Code status - DNR  NS consult - family decided to pursue comfort care alone -> formal neurosurgery consult not performed.   Mild leukocytosis - WBC's - 12.9  (afebrile)  Mild anemia  Per Dr Thomasena Edis' note - discussion with daughter - pt to be comfort care only.  Hospital day # 0 Patient unfortunately has had a fairly large intracerebral hemorrhage which is associated with significant morbidity and mortality and is unlikely to survive this without major disability and will not be able to live independently and likely need 24-hour nursing care.  The patient daughter is quite clear the patient would not want to  live like this and family has decided to make her DNR and full comfort care measures only.  Recommend start IV morphine drip for patient comfort.  Case manager consultation to consider transfer to hospice nursing home if bed available.  Long discussion with the patient and daughter and care team and answered questions. This patient is critically ill and at significant risk of neurological worsening, death and care requires constant monitoring of vital signs, hemodynamics,respiratory and cardiac monitoring, extensive review of multiple databases, frequent neurological assessment, discussion with family, other specialists and medical decision making of high complexity.I have made any additions or clarifications directly to the above note.This critical care time does not reflect procedure time, or teaching time or supervisory time of PA/NP/Med Resident etc but could involve care discussion time.  I spent 35 minutes of neurocritical care time  in the care of  this patient.    Delia Heady, MD  To contact Stroke Continuity provider, please refer to [email or web address]. After hours, contact General Neurology

## 2020-06-01 NOTE — ED Notes (Signed)
Daughter at bedside. Requesting patient be given something stronger for pain. MD notified.

## 2020-06-01 NOTE — ED Notes (Signed)
pts daughter refused straight sticking/ blood draws and for the monitor to remain off.

## 2020-06-01 NOTE — ED Notes (Signed)
Pt given Morphine 5mg  SL and Zofran 4mg  IV. Daughter at bedside.

## 2020-06-01 NOTE — Discharge Summary (Addendum)
Patient ID: [name]   MRN: [id number]      DOB: 02-06-1934  Date of Admission: 06/01/2020 Date of Discharge: 06/01/2020  Attending Physician:  Rica Mote, MD, Stroke MD Consultant(s):    None  Patient's PCP:  Daylene Katayama, [place]  DISCHARGE DIAGNOSIS:  Active Problems:   Stroke, hemorrhagic [facility])   Past Medical History:  Diagnosis Date  . Acute systolic heart failure (HCC)   . Arthritis   . CHF (congestive heart failure) (HCC)   . Coronary artery disease   . Hyperlipidemia   . PAF (paroxysmal atrial fibrillation) (HCC)   . STEMI (ST elevation myocardial infarction) (HCC)    04/04/18 PCI/DES to the pLAD, 95% dRCA (possible staged intervention), 75% mLAD, EF 45%   Past Surgical History:  Procedure Laterality Date  . CHOLECYSTECTOMY    . CHOLECYSTECTOMY  08/09/2011   Procedure: LAPAROSCOPIC CHOLECYSTECTOMY;  Surgeon: Robyne Askew, MD;  Location: [facility] ORS;  Service: General;  Laterality: N/A;  . CORONARY/GRAFT ACUTE MI REVASCULARIZATION N/A 04/04/2018   Procedure: Coronary/Graft Acute MI Revascularization;  Surgeon: Corky Crafts, MD;  Location: [facility] INVASIVE CV LAB;  Service: Cardiovascular;  Laterality: N/A;  . HERNIA REPAIR    . LEFT HEART CATH AND CORONARY ANGIOGRAPHY N/A 04/04/2018   Procedure: LEFT HEART CATH AND CORONARY ANGIOGRAPHY;  Surgeon: Corky Crafts, MD;  Location: [facility] INVASIVE CV LAB;  Service: Cardiovascular;  Laterality: N/A;  . LEFT HEART CATH AND CORONARY ANGIOGRAPHY N/A 03/21/2020   Procedure: LEFT HEART CATH AND CORONARY ANGIOGRAPHY;  Surgeon: [place], Peter M, MD;  Location: [facility] INVASIVE CV LAB;  Service: Cardiovascular;  Laterality: N/A;  . PACEMAKER IMPLANT N/A 03/22/2020   Procedure: PACEMAKER IMPLANT;  Surgeon: Marinus Maw, MD;  Location: [facility] INVASIVE CV LAB;  Service: Cardiovascular;  Laterality: N/A;  . SVT ABLATION N/A 03/21/2020   Procedure: SVT ABLATION;  Surgeon: Marinus Maw, MD;  Location: [facility] INVASIVE CV LAB;   Service: Cardiovascular;  Laterality: N/A;  . TUBAL LIGATION      Family History Family History  Problem Relation Age of Onset  . Hypertension Mother     Social History  reports that she has never smoked. She has never used smokeless tobacco. She reports that she does not drink alcohol and does not use drugs.  Allergies as of 06/01/2020      Reactions   Amiodarone Nausea And Vomiting   Cymbalta [duloxetine Hcl] Other (See Comments)   "Made me feel crazy"      Medication List    STOP taking these medications   acetaminophen 500 MG tablet Commonly known as: TYLENOL   ANTI-OXIDANT FORMULA PO   apixaban 5 MG Tabs tablet Commonly known as: Eliquis   ASTAXANTHIN PO   atorvastatin 40 MG tablet Commonly known as: LIPITOR   BERBERINE COMPLEX PO   clopidogrel 75 MG tablet Commonly known as: PLAVIX   ferrous sulfate 325 (65 FE) MG tablet   Fish Oil 1000 MG Caps   HAWTHORN BERRY PO   HYDROcodone-acetaminophen 5-325 MG tablet Commonly known as: NORCO/VICODIN   magnesium gluconate 500 MG tablet Commonly known as: MAGONATE   meclizine 25 MG tablet Commonly known as: ANTIVERT   metoprolol succinate 25 MG 24 hr tablet Commonly known as: TOPROL-XL   multivitamin-lutein Caps capsule   nitroGLYCERIN 0.4 MG SL tablet Commonly known as: NITROSTAT   OIL OF OREGANO PO   ondansetron 4 MG disintegrating tablet Commonly known as: ZOFRAN-ODT  predniSONE 20 MG tablet Commonly known as: DELTASONE   promethazine 12.5 MG tablet Commonly known as: PHENERGAN   QUERCETIN PO   traMADol 50 MG tablet Commonly known as: ULTRAM   Turmeric 500 MG Tabs   vitamin B-12 1000 MCG tablet Commonly known as: CYANOCOBALAMIN   vitamin C 1000 MG tablet   [place] BARK PO   zinc gluconate 50 MG tablet        LABORATORY STUDIES CBC    Component Value Date/Time   WBC 12.9 (H) 06/01/2020 0041   RBC 3.68 (L) 06/01/2020 0041   HGB 11.9 (L) 06/01/2020 0045   HGB 13.9  04/03/2019 1034   HCT 35.0 (L) 06/01/2020 0045   HCT 41.6 04/03/2019 1034   PLT 294 06/01/2020 0041   PLT 257 04/03/2019 1034   MCV 90.2 06/01/2020 0041   MCV 93 04/03/2019 1034   MCH 28.0 06/01/2020 0041   MCHC 31.0 06/01/2020 0041   RDW 17.4 (H) 06/01/2020 0041   RDW 12.8 04/03/2019 1034   LYMPHSABS 0.7 06/01/2020 0041   MONOABS 0.6 06/01/2020 0041   EOSABS 0.0 06/01/2020 0041   BASOSABS 0.0 06/01/2020 0041   CMP    Component Value Date/Time   NA 135 06/01/2020 0045   NA 141 06/02/2018 1525   K 4.6 06/01/2020 0045   CL 107 06/01/2020 0045   CO2 18 (L) 06/01/2020 0041   GLUCOSE 158 (H) 06/01/2020 0045   BUN 35 (H) 06/01/2020 0045   BUN 23 06/02/2018 1525   CREATININE 0.70 06/01/2020 0045   CALCIUM 8.5 (L) 06/01/2020 0041   PROT 6.2 (L) 06/01/2020 0041   ALBUMIN 3.2 (L) 06/01/2020 0041   AST 24 06/01/2020 0041   ALT 16 06/01/2020 0041   ALKPHOS 55 06/01/2020 0041   BILITOT 0.5 06/01/2020 0041   GFRNONAA >60 06/01/2020 0041   GFRAA >60 03/22/2020 0326   COAGS Lab Results  Component Value Date   INR 1.3 (H) 06/01/2020   INR 1.04 04/04/2018   Lipid Panel    Component Value Date/Time   CHOL 116 03/20/2020 0314   TRIG 52 03/20/2020 0314   HDL 52 03/20/2020 0314   CHOLHDL 2.2 03/20/2020 0314   VLDL 10 03/20/2020 0314   LDLCALC 54 03/20/2020 0314   HgbA1C  Lab Results  Component Value Date   HGBA1C 6.2 (H) 03/19/2020   Urinalysis    Component Value Date/Time   COLORURINE YELLOW 06/01/2020 0645   APPEARANCEUR CLEAR 06/01/2020 0645   LABSPEC 1.030 06/01/2020 0645   PHURINE 5.0 06/01/2020 0645   GLUCOSEU NEGATIVE 06/01/2020 0645   HGBUR MODERATE (A) 06/01/2020 0645   BILIRUBINUR NEGATIVE 06/01/2020 0645   KETONESUR NEGATIVE 06/01/2020 0645   PROTEINUR NEGATIVE 06/01/2020 0645   UROBILINOGEN 0.2 08/08/2011 1800   NITRITE POSITIVE (A) 06/01/2020 0645   LEUKOCYTESUR NEGATIVE 06/01/2020 0645   Urine Drug Screen No results found for: LABOPIA, COCAINSCRNUR,  LABBENZ, AMPHETMU, THCU, LABBARB  Alcohol Level No results found for: ETH   SIGNIFICANT DIAGNOSTIC STUDIES  CT HEAD CODE STROKE WO CONTRAST 06/01/2020 IMPRESSION:  1. 4.9 x 7.5 x 5.2 cm (estimated volume 96 cc) intraparenchymal hemorrhage centered at the right parieto-occipital region, with associated vasogenic edema and regional mass effect and 3 mm of right-to-left shift. Associated intraventricular extension with blood within both lateral ventricles. Increased lateral ventriculomegaly consistent with hydrocephalus.  2. Underlying atrophy with chronic small vessel ischemic disease.   ECG - ST rate 105 BPM. (See cardiology reading for complete details)  HISTORY OF PRESENT ILLNESS  (From H&P by Dr Thomasena Edis 06/01/20) Mahi Zabriskie Yorio is a 86 y.o. female Hx systolic CHF, chronic CAD, SVT, complete heart block, hyperlipidemia, paroxysmal atrial fibrillation on Eliquiswas walking to the bathroom and fell. Her family found her on the floor with right sided gaze, left sided weakness, slurred speech and called EMS who noted headache and not able to name objects and was neglecting her left side. On arrival, the patient was complaining of headache and was able to answer questions consistently.  Denies hitting her head. Headache described as occipital headache (5-6/10) radiating anteriorly with associated nausea. Denies dizziness, hearing loss, fever, chills. LKW: 2300 tpa given?: No, ICH IR Thrombectomy? No, ICH Modified Rankin Scale: 0-Completely asymptomatic and back to baseline post- stroke NIHSS: LOC Responsiveness 0 LOC Questions 0 LOC Commands 0 Horizontal eye movement 0 Visual field 1 Facial palsy 1 Motor arm - Right arm 0 Motor arm - Left arm 0 Motor leg - Right leg 0 Motor leg - Left leg 0 Limb ataxia 1 Sensory test 0 Language 0 Speech 0 Extinction and inattention 0  Result  NIH Stroke Scale/Score (NIHSS) 3  HOSPITAL COURSE Ms. Karlynn Furrow Bow is a 86 y.o. female with  history of systolic CHF, chronic CAD, SVT, complete heart block, PPM, hyperlipidemia, paroxysmal atrial fibrillation on Eliquispresenting after she was found on the floor having fallen by family with right sided gaze, left sided weakness, slurred speech, headache, nausea and unable toname objects.  She did not receive IV t-PA due to ICH  ICH: 4.9 x 7.5 x 5.2 cm (estimated volume 96 cc) intraparenchymal hemorrhage centered at the right parieto-occipital region.  Code Stroke CT Head - not ordered   CT head -  4.9 x 7.5 x 5.2 cm (estimated volume 96 cc) intraparenchymal hemorrhage centered at the right parieto-occipital region, with associated vasogenic edema and regional mass effect and 3 mm of right-to-left shift. Associated intraventricular extension with blood within both lateral ventricles. Increased lateral ventriculomegaly consistent with hydrocephalus.   MRI head - not ordered  MRA head - not ordered  CTA H&N - not ordered  CT Perfusion - not ordered  Carotid Doppler - not indicated  2D Echo - not indicated  [employer] Virus 2 - negative  LDL - cancelled  HgbA1c cancelled  UDS - not ordered  VTE prophylaxis - SCDs  Diet NPO  Eliquis and Plavix prior to admission, now on No antithrombotic  Ongoing aggressive stroke risk factor management  Therapy recommendations:  cancelled  Disposition:  Arrangements made to discharge pt to Hospice facility.  Hypertension  Home BP meds: Toprol  Current BP meds: none  Stable  SBP goal < 140 mm Hg for the first 24 hours then < 160 mm Hg.  Long-term BP goal normotensive  Hyperlipidemia  Home Lipid lowering medication: Lipitor 40 mg daily  LDL cancelled, goal < 70  Current lipid lowering medication: None (statin contraindicated with ICH)  Continue statin at discharge  Other Stroke Risk Factors  Advanced age  Coronary artery disease  Congestive Heart Failure  Atrial fibrillation on Eliquis and Plavix PTA ->  Andexxa  Other Active Problems  Code status - DNR  NS consult - family decided to pursue comfort care alone -> formal neurosurgery consult not performed.   Mild leukocytosis - WBC's - 12.9  (afebrile)  Mild anemia  Per Dr Thomasena Edis' note - discussion with daughter - pt to be comfort care only.    DISCHARGE EXAM Vitals:  06/01/20 0530 06/01/20 0545 06/01/20 0600 06/01/20 0645  BP: 130/64 (!) 142/76 (!) 148/65 (!) 148/65  Pulse:    (!) 102  Resp: (!) 21 15 19 18   Temp:      TempSrc:      SpO2:   95% (!) 86%  Weight:      Height:       Physical Exam  Constitutional: Appears well-developed and well-nourished.  Psych: Affect appropriate to situation Eyes: No scleral injection HENT: No OP obstrucion Head: Normocephalic.  Cardiovascular: Normal rate and regular rhythm.  Respiratory: Effort normal and breath sounds normal to anterior ascultation GI: Soft.  No distension. There is no tenderness.  Skin: WDI  Neuro: Mental Status: Patient is awake, alert, oriented to person, place, month, year, and situation - Distressed when discussing hemorrhagic stroke. Patient is able to give a clear and coherent history. No signs of aphasia or neglect. Cranial Nerves: II: Visual Fields restricted left lower quad. Pupils are equal, round, and reactive to light. III,IV, VI: EOMI without ptosis or diploplia.  V: Facial sensation is symmetric to temperature VII: Facial movement is asymmetric mild droop on left.  VIII: Hearing is intact to voice X: Uvula elevates symmetrically XI: Shoulder shrug is symmetric. XII: tongue is midline without atrophy or fasciculations.  Motor: Tone is normal. Bulk is normal. 5/5 strength was present in all four extremities. Sensory: Sensation is symmetric to light touch and temperature in the arms and legs. Deep Tendon Reflexes: 2+ and symmetric in the biceps and patellae. Plantars: Babinski left Cerebellar: FNF and HKS mild ataxia left upper  extremity.   DISCHARGE DIET   Diet Order            Diet NPO time specified  Diet effective now                liquids  DISCHARGE PLAN  Disposition:  Discharge to Hospice Facility  No antithrombotic for secondary stroke prevention -> ICH  Morphine as needed for pain   32 minutes were spent preparing discharge.  PA-C [facility] Pager [phone] 06/01/2020, 4:15 PM I have personally obtained history,examined this patient, reviewed notes, independently viewed imaging studies, participated in medical decision making and plan of care.ROS completed by me personally and pertinent positives fully documented  I have made any additions or clarifications directly to the above note. Agree with note above.   14/01/2020, MD Medical Director [facility] Pager: [phone] 06/01/2020 4:39 PM

## 2020-06-01 NOTE — ED Notes (Signed)
Increased cleviprex to 4mg  at this time.

## 2020-06-01 NOTE — Progress Notes (Signed)
In light of patient's family's decision to pursue comfort care alone formal neurosurgery consult will not be undertaken.

## 2020-06-01 NOTE — Discharge Instructions (Signed)
Hospice Hospice is a service that is designed to provide people who are terminally ill and their families with medical, spiritual, and psychological support. Its aim is to improve your quality of life by keeping you as comfortable as possible in the final stages of life. Who will be my providers when I begin hospice care? Hospice teams often include:  A nurse.  A doctor. The hospice doctor will be available for your care, but you can include your regular doctor or nurse practitioner.  A social worker.  A counselor.  A religious leader (such as a chaplain).  A dietitian.  Therapists.  Trained volunteers who can help with care. What services does hospice provide? Hospice services can vary depending on the center or organization. Generally, they include:  Ways to keep you comfortable, such as: ? Providing care in your home or in a home-like setting. ? Working with your family and friends to help meet your needs. ? Allowing you to enjoy the support of loved ones by receiving much of your basic care from family and friends.  Pain relief and symptom management. The staff will supply all necessary medicines and equipment so that you can stay comfortable and alert enough to enjoy the company of your friends and family.  Visits or care from a nurse and doctor. This may include 24-hour on-call services.  Companionship when you are alone.  Allowing you and your family to rest. Hospice staff may do light housekeeping, prepare meals, and run errands.  Counseling. They will make sure your emotional, spiritual, and social needs are being met, as well as those needs of your family members.  Spiritual care. This will be individualized to meet your needs and your family's needs. It may involve: ? Helping you and your family understand the dying process. ? Helping you say goodbye to your family and friends. ? Performing a specific religious ceremony or ritual.  Massage.  Nutrition  therapy.  Physical and occupational therapy.  Short-term inpatient care, if something cannot be managed in the home.  Art or music therapy.  Bereavement support for grieving family members. When should hospice care begin? Most people who use hospice are believed to have less than 6 months to live.  Your family and health care providers can help you decide when hospice services should begin.  If you live longer than 6 months but your condition does not improve, your doctor may be able to approve you for continued hospice care.  If your condition improves, you may discontinue the program. What should I consider before selecting a program? Most hospice programs are run by nonprofit, independent organizations. Some are affiliated with hospitals, nursing homes, or home health care agencies. Hospice programs can take place in your home or at a hospice center, hospital, or skilled nursing facility. When choosing a hospice program, ask the following questions:  What services are available to me?  What services will be offered to my loved ones?  How involved will my loved ones be?  How involved will my health care provider be?  Who makes up the hospice care team? How are they trained or screened?  How will my pain and symptoms be managed?  If my circumstances change, can the services be provided in a different setting, such as my home or in the hospital?  Is the program reviewed and licensed by the state or certified in some other way?  What does it cost? Is it covered by insurance?  If I choose a hospice   center or nursing home, where is the hospice center located? Is it convenient for family and friends?  If I choose a hospice center or nursing home, can my family and friends visit any time?  Will you provide emotional and spiritual support?  Who can my family call with questions? Where can I learn more about hospice? You can learn about existing hospice programs in your area  from your health care providers. You can also read more about hospice online. The websites of the following organizations have helpful information:  [facility] and Palliative Care [facility]): http://www.brown-buchanan.com/  National Association for [facility]): http://massey-hart.com/  Hospice Foundation of [place] ([place]): www.hospicefoundation.org  American Cancer Society (ACS): www.cancer.org  Hospice Net: www.hospicenet.org  Visiting Nurse Associations of [place] (VNAA): www.vnaa.org You may also find more information by contacting the following agencies:  A local agency on aging.  Your local [employer] chapter.  Your state's department of health or social services. Summary  Hospice is a service that is designed to provide people who are terminally ill and their families with medical, spiritual, and psychological support.  Hospice aims to improve your quality of life by keeping you as comfortable as possible in the final stages of life.  Hospice teams often include a doctor, nurse, social worker, counselor, religious leader,dietitian, therapists, and volunteers.  Hospice care generally includes medicine for symptom management, visits from doctors and nurses, physical and occupational therapy, nutrition counseling, spiritual and emotional counseling, caregiver support, and bereavement support for grieving family members.  Hospice programs can take place in your home or at a hospice center, hospital, or skilled nursing facility. This information is not intended to replace advice given to you by your health care provider. Make sure you discuss any questions you have with your health care provider. Document Revised: 03/04/2019 Document Reviewed: 07/03/2016 Elsevier Patient Education  2020 [place] care is the physical, emotional, mental, and spiritual care a person receives during the last days, weeks, or months of life. Care at the end of a  person's life requires a team of professionals, which may include:  Health care providers.  A Education officer, museum.  A spiritual adviser. The goal of end-of-life care is to give the patient the highest quality of life possible at the end of life. What are the different types of end-of-life care? There are different options for receiving care at the end of your life. Palliative care This type of care can be delivered at the same time as other treatments. The goal is to manage your symptoms and improve your quality of life, which may include:  Control of pain and other symptoms.  Family support.  Spiritual support.  Emotional and social support.  Comfort. You may need palliative care for months or years to manage a long-term (chronic) disease or condition. Hospice care This is a kind of end-of-life care that may be recommended by your health care providers. Hospice care is usually offered when a person is expected to live for six months or less. Hospice care is designed to provide people who are terminally ill and their families with medical, spiritual, and psychological support. The aim is to improve your quality of life by keeping you as comfortable as possible in the final stages of life. Comfort care This type of care is designed to help meet your basic needs and maintain your overall comfort at the end of your life. This includes:  Caring for your skin.  Making sure that you are breathing well.  Ensuring that you are eating well.  Making sure that you get enough rest.  Making sure that you are at a comfortable body temperature. A plan for comfort care can also address the mental, emotional, and spiritual issues that may come up at the end of your life. Where does end-of-life care take place? End-of-life care can take place wherever you are living, as long as you get the care you need. End-of-life care can happen:  At your home.  In a nursing home.  In a hospital. You and  your loved ones may be able to decide where end-of-life care takes place. This decision depends on:  Your wishes.  Your comfort.  The medical equipment you need. How do I know when it is time for end-of-life care? Your health care provider may tell you that treatments can no longer control your illness. You may also decide that you do not want to undergo the treatments that are available. Talk to your health care provider and your loved ones about your end-of-life care options. If possible, discuss the following topics with your health care provider before you need end-of-life care:  How much medical treatment you want during end-of-life care.  Where you would like to live during end-of-life care.  What kinds of treatments you would like to keep you comfortable.  Which treatments you would refuse.  Your faith or spiritual needs at the end of your life.  Who will handle practical details, such as your will, finances, and funeral planning. You can create legal documents (advance directives) to let your loved ones know your wishes for end-of-life care. Talk to your health care provider or a lawyer about making a living will that explains your medical wishes. You can also have a medical power of attorney. This designates a person to make health decisions for you if you cannot make them yourself. Summary  End-of-life care is the physical, emotional, mental, and spiritual care a person receives during the last days, weeks, or months of life.  The goal of end-of-life care is to give the patient the highest quality of life possible at the end of life.  There are a number of different options for receiving this care, including palliative care, hospice care, or comfort care.  Talk to your health care provider and your loved ones about your preferences for end-of-life care. This includes the place to receive care, the kind of care you want to receive, the care you want to decline, your spiritual  needs, and your finances. This information is not intended to replace advice given to you by your health care provider. Make sure you discuss any questions you have with your health care provider. Document Revised: 06/25/2017 Document Reviewed: 06/25/2017 Elsevier Patient Education  2020 [employer].

## 2020-06-01 NOTE — H&P (Addendum)
Neurology H&P  CC: right gaze deviation and left weakness  History is obtained from: Patient, EMS and family  HPI: NIVEA WOJDYLA is a 86 y.o. female Hx systolic CHF, chronic CAD, SVT, complete heart block, hyperlipidemia, paroxysmal atrial fibrillation on Eliquis was walking to the bathroom and fell. Her family found her on the floor with right sided gaze, left sided weakness, slurred speech and called EMS who noted headache and not able to name objects and was neglecting her left side. On arrival, the patient was complaining of headache and was able to answer questions consistently.   Denies hitting her head. Headache described as occipital headache (5-6/10) radiating anteriorly with associated nausea.  Denies dizziness, hearing loss, fever, chills.  LKW: 2300 tpa given?: No, ICH IR Thrombectomy? No, ICH Modified Rankin Scale: 0-Completely asymptomatic and back to baseline post- stroke NIHSS: LOC Responsiveness 0 LOC Questions 0 LOC Commands 0 Horizontal eye movement 0 Visual field 1 Facial palsy 1 Motor arm - Right arm 0 Motor arm - Left arm 0 Motor leg - Right leg 0 Motor leg - Left leg 0 Limb ataxia 1 Sensory test 0 Language 0 Speech 0 Extinction and inattention 0  Result  NIH Stroke Scale/Score (NIHSS) 3   ROS: A complete ROS was performed and is negative except as noted in the HPI.   Past Medical History:  Diagnosis Date  . Acute systolic heart failure (HCC)   . Arthritis   . CHF (congestive heart failure) (HCC)   . Coronary artery disease   . Hyperlipidemia   . PAF (paroxysmal atrial fibrillation) (HCC)   . STEMI (ST elevation myocardial infarction) (HCC)    04/04/18 PCI/DES to the pLAD, 95% dRCA (possible staged intervention), 75% mLAD, EF 45%    Family History  Problem Relation Age of Onset  . Hypertension Mother     Social History:  reports that she has never smoked. She has never used smokeless tobacco. She reports that she does not drink alcohol  and does not use drugs.   Prior to Admission medications   Medication Sig Start Date End Date Taking? Authorizing Provider  acetaminophen (TYLENOL) 500 MG tablet Take 1,000 mg by mouth every 4 (four) hours as needed for mild pain.     [provider]  apixaban (ELIQUIS) 5 MG TABS tablet Take 1 tablet (5 mg total) by mouth 2 (two) times daily. 06/04/18   Robbie Lis M, PA-C  Ascorbic Acid (VITAMIN C) 1000 MG tablet Take 500 mg by mouth daily.    [provider]  ASTAXANTHIN PO Take 1 tablet by mouth daily.    [provider]  atorvastatin (LIPITOR) 40 MG tablet TAKE 1 TABLET THREE TIMES WEEKLY 03/25/20   Corky Crafts, MD  Barberry-Oreg Grape-Goldenseal (BERBERINE COMPLEX PO) Take 1 capsule by mouth daily.    [provider]  clopidogrel (PLAVIX) 75 MG tablet TAKE 1 TABLET EVERY DAY 04/28/20   Corky Crafts, MD  ferrous sulfate 325 (65 FE) MG tablet Take 325 mg by mouth daily with breakfast.    [provider]  HAWTHORN BERRY PO Take 1 tablet by mouth daily.    [provider]  HYDROcodone-acetaminophen (NORCO/VICODIN) 5-325 MG tablet Take 1-2 tablets by mouth every 6 (six) hours as needed for moderate pain or severe pain. 05/15/20   Arby Barrette, MD  magnesium gluconate (MAGONATE) 500 MG tablet Take 500 mg by mouth daily.    [provider]  meclizine (ANTIVERT) 25 MG tablet  Take 25 mg by mouth daily as needed for dizziness.    [provider]  metoprolol succinate (TOPROL-XL) 25 MG 24 hr tablet Take 1 tablet (25 mg total) by mouth daily. Please keep upcoming appt in January with Dr. Ladona Ridgel before anymore refills. Thank you 03/23/20   Marinus Maw, MD  Misc Natural Products (WHITE WILLOW BARK PO) Take 1 capsule by mouth daily.    [provider]  Multiple Vitamins-Minerals (ANTI-OXIDANT FORMULA PO) Take 1 tablet by mouth daily.    [provider]  multivitamin-lutein (OCUVITE-LUTEIN)  CAPS capsule Take 1 capsule by mouth daily.    [provider]  nitroGLYCERIN (NITROSTAT) 0.4 MG SL tablet Place 1 tablet (0.4 mg total) under the tongue every 5 (five) minutes as needed for chest pain. 05/08/18   Corky Crafts, MD  OIL OF OREGANO PO Take 1 capsule by mouth daily.    [provider]  Omega-3 Fatty Acids (FISH OIL) 1000 MG CAPS Take 1,000 mg by mouth daily.    [provider]  ondansetron (ZOFRAN-ODT) 4 MG disintegrating tablet Take 4 mg by mouth every 8 (eight) hours as needed for nausea or vomiting.  04/30/18   [provider]  predniSONE (DELTASONE) 20 MG tablet 3 tabs po day one, then 2 po daily x 4 days 05/15/20   Arby Barrette, MD  promethazine (PHENERGAN) 12.5 MG tablet Take 1 tablet (12.5 mg total) by mouth every 6 (six) hours as needed for up to 7 days for nausea or vomiting. 05/17/18 03/19/20  Briant Cedar, MD  QUERCETIN PO Take 1 tablet by mouth daily.    [provider]  traMADol (ULTRAM) 50 MG tablet Take 50 mg by mouth every 6 (six) hours as needed for moderate pain.     [provider]  Turmeric 500 MG TABS Take 500 mg by mouth daily.    [provider]  vitamin B-12 (CYANOCOBALAMIN) 1000 MCG tablet Take 1,000 mcg by mouth daily.    [provider]  zinc gluconate 50 MG tablet Take 50 mg by mouth daily.    [provider]    Exam: Current vital signs: There were no vitals taken for this visit.  Physical Exam  Constitutional: Appears well-developed and well-nourished.  Psych: Affect appropriate to situation Eyes: No scleral injection HENT: No OP obstrucion Head: Normocephalic.  Cardiovascular: Normal rate and regular rhythm.  Respiratory: Effort normal and breath sounds normal to anterior ascultation GI: Soft.  No distension. There is no tenderness.  Skin: WDI  Neuro: Mental Status: Patient is awake, alert, oriented to person, place, month, year, and situation -  Distressed when discussing hemorrhagic stroke. Patient is able to give a clear and coherent history. No signs of aphasia or neglect. Cranial Nerves: II: Visual Fields restricted left lower quad. Pupils are equal, round, and reactive to light. III,IV, VI: EOMI without ptosis or diploplia.  V: Facial sensation is symmetric to temperature VII: Facial movement is asymmetric mild droop on left.  VIII: Hearing is intact to voice X: Uvula elevates symmetrically XI: Shoulder shrug is symmetric. XII: tongue is midline without atrophy or fasciculations.  Motor: Tone is normal. Bulk is normal. 5/5 strength was present in all four extremities. Sensory: Sensation is symmetric to light touch and temperature in the arms and legs. Deep Tendon Reflexes: 2+ and symmetric in the biceps and patellae. Plantars: Babinski left Cerebellar: FNF and HKS mild ataxia left upper extremity.  I have reviewed the images obtained:  NCT head showed intraparenchymal hemorrhage centered at the right parieto-occipital region, with associated vasogenic edema and regional mass effect and 3 mm of right-to-left shift. Associated intraventricular extension with blood within both lateral ventricles. Increased lateral ventriculomegaly consistent with hydrocephalus.   Assessment: ARIENNE GARTIN is a 86 y.o. female with large, acute right parietoccipital hemorrhagic stroke ICH 3 with minimal deficits.  Impression Acute right parietoccipital hemorrhagic stroke. ICH score 3. Elevated blood pressure. Discussed goals of care with family and they do not want surgery or intubation.  Plan: - Admit to NSICU. - Elevate head of bed. - Reverse Eliquis - Andexanet.  - Blood pressure control - SBP <140: start clevidipine. - Start 3% NaCl infusion with serum Na goal 145-155. - Neurosurgery consult. - Repeat brain imaging either  CT head or MRI brain without contrast in 6 hours. - Recommend vascular imaging either carotid ultrasound or  MRA/CTA head and neck. - Recommend TTE. - Recommend labs: HbA1c, lipid panel, TSH. - Recommend Statin if LDL > 70 - No anticoagulants or antiplatelets for now. - Telemetry monitoring for arrhythmia. - Recommend bedside Swallow screen. - Recommend Stroke education. - Recommend PT/OT/SLP consult. - SCDs for now. - Close neuro monitoring - If acute neurologic decline stat CT head non-contrast.  This patient is critically ill and at significant risk of neurological worsening, death and care requires constant monitoring of vital signs, hemodynamics,respiratory and cardiac monitoring, neurological assessment, discussion with family, other specialists and medical decision making of high complexity. I spent 75 minutes of neurocritical care time  in the care of  this patient. This was time spent independent of any time provided by nurse practitioner or PA.  Addendum: Discussion with daughter and she would like patient to be comfort care only.   Electronically signed by: Dr. Marisue Humble Pager: [phone] 06/01/2020, 12:59 AM

## 2020-06-05 ENCOUNTER — Other Ambulatory Visit: Payer: Self-pay | Admitting: Internal Medicine

## 2020-06-15 ENCOUNTER — Ambulatory Visit: Payer: Medicare HMO | Admitting: Interventional Cardiology

## 2020-06-25 DEATH — deceased

## 2020-07-05 ENCOUNTER — Encounter: Payer: Medicare HMO | Admitting: Internal Medicine

## 2020-07-11 IMAGING — DX DG CHEST 1V PORT
1 series · 1 of 1 positions shown · non-contrast
Comparison: Chest CT 08/10/2011

CLINICAL DATA: Shortness of breath and chest heaviness.

EXAM:
PORTABLE CHEST 1 VIEW

[chest ap]
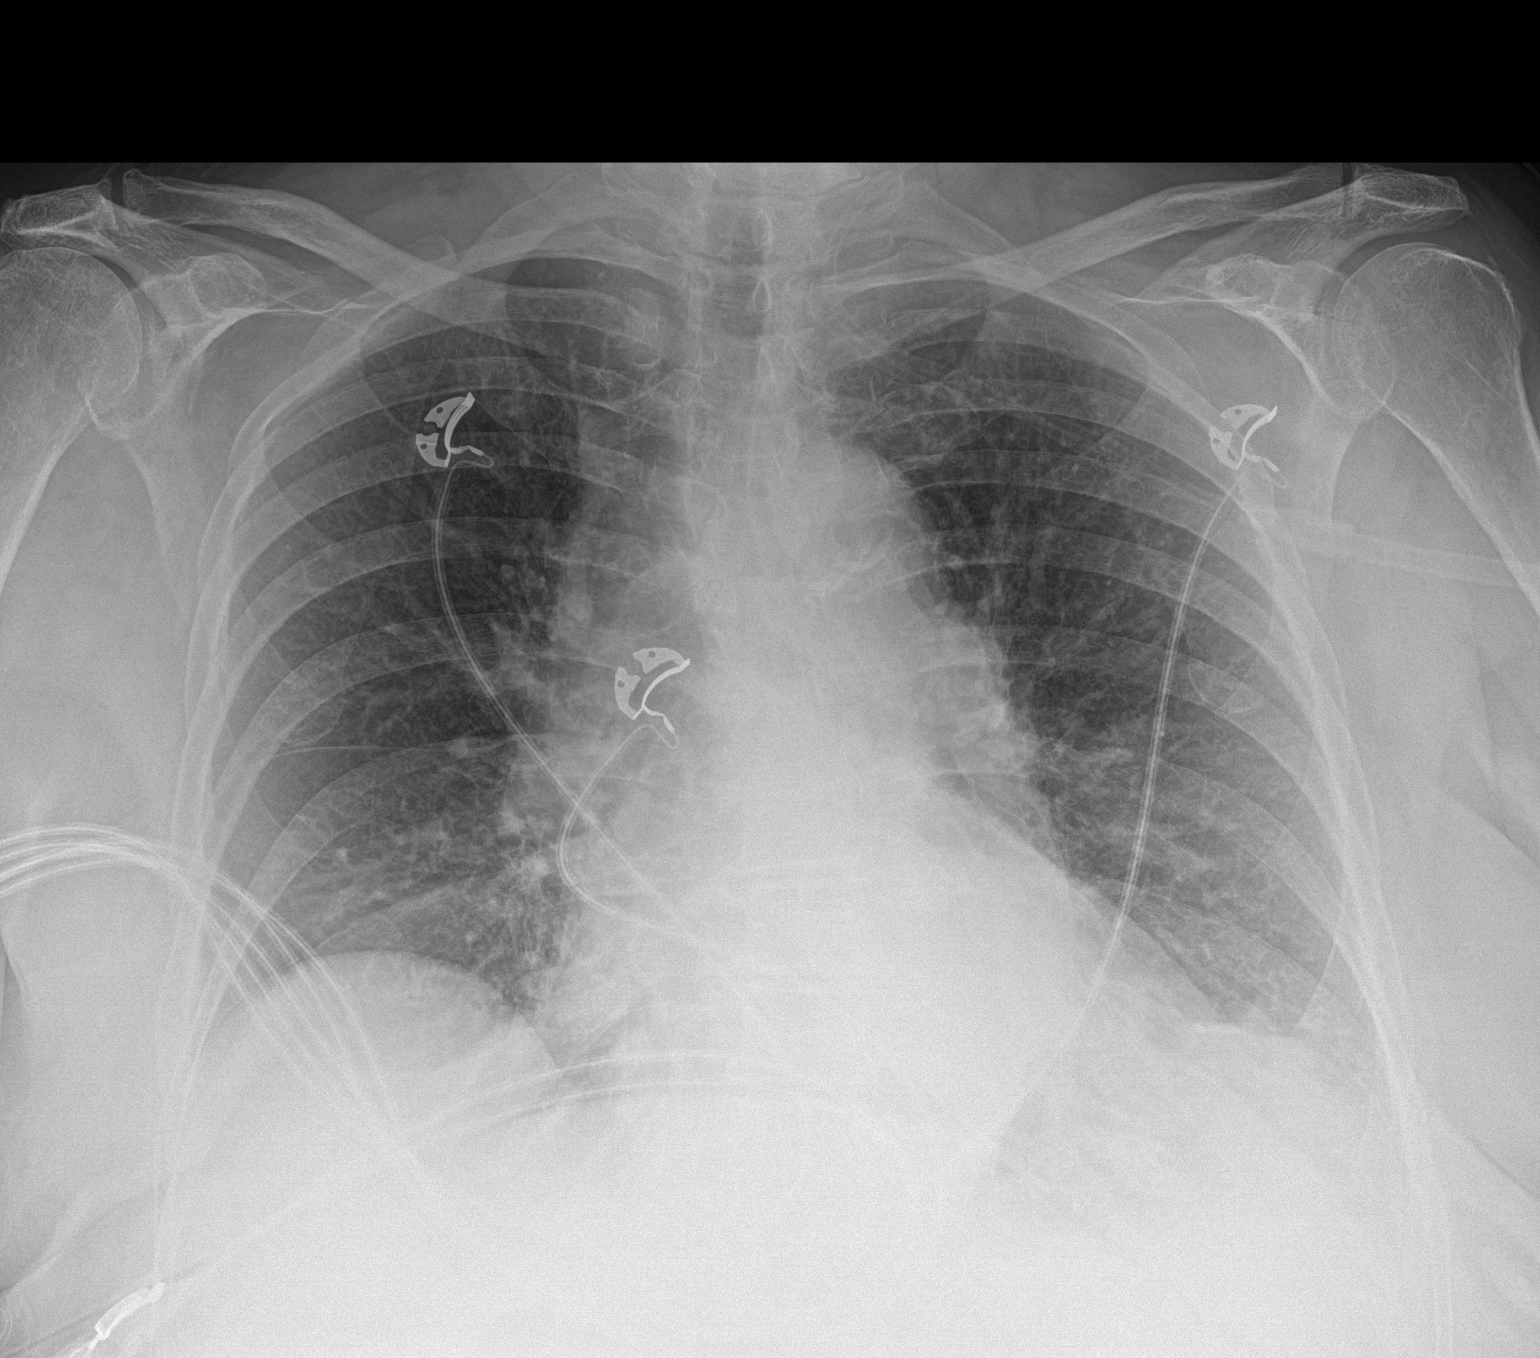

[1 of 1 positions shown; findings below may reference images not displayed]

FINDINGS: Enlarged heart. Calcific atherosclerotic disease of the aorta.
Mediastinal contours appear intact.

There is no evidence of pleural effusion or pneumothorax. Low lung
volumes. Left lower lobe peribronchial airspace consolidation versus
atelectasis.

Osseous structures are without acute abnormality. Soft tissues are
grossly normal.
IMPRESSION: Enlarged heart with calcific atherosclerotic disease of the aorta.

Left lower lobe peribronchial airspace consolidation versus
atelectasis.

## 2020-10-10 ENCOUNTER — Other Ambulatory Visit: Payer: Self-pay | Admitting: Internal Medicine

## 2022-06-18 IMAGING — CT CT HEAD W/O CM
4 series · 17 of 47 positions shown, 19 images · non-contrast
Comparison: None.

CLINICAL DATA: Head trauma.

EXAM:
CT HEAD WITHOUT CONTRAST
TECHNIQUE: Contiguous axial images were obtained from the base of the skull
through the vertex without intravenous contrast.

[Series 3: head without · axial · non-contrast · 0.42mm/px · z∈[-89,+31]mm · 7 of 34 slices shown, 9 images]
[im 5/34  brain]
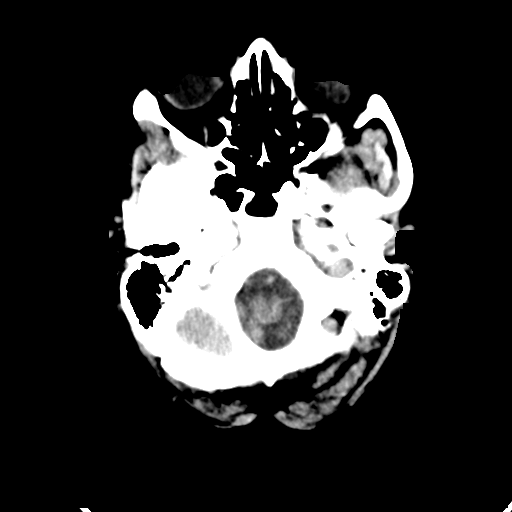
[im 5/34  bone]
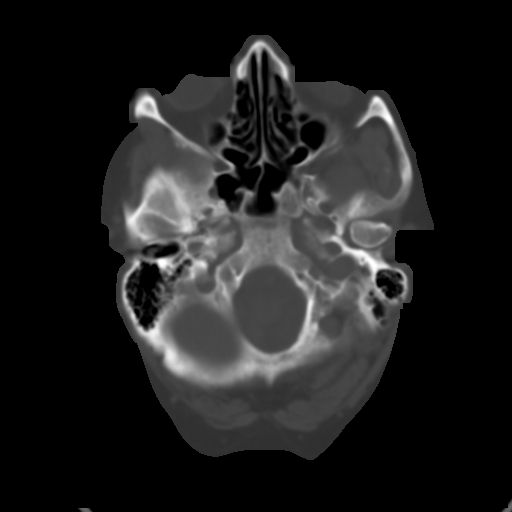
[im 9/34  brain]
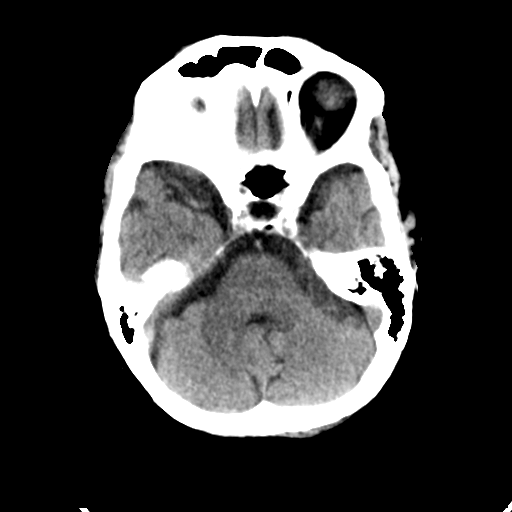
[im 13/34  brain]
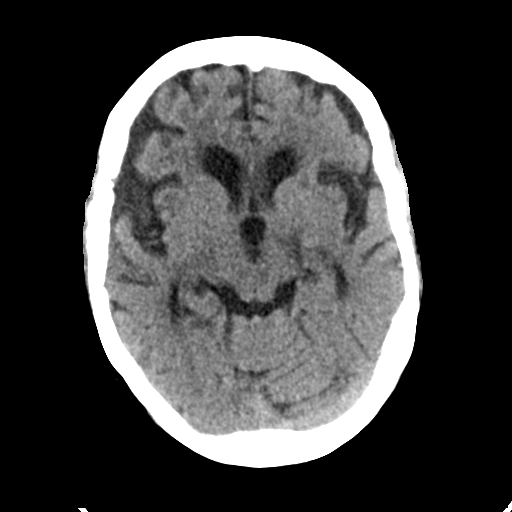
[im 17/34  brain]
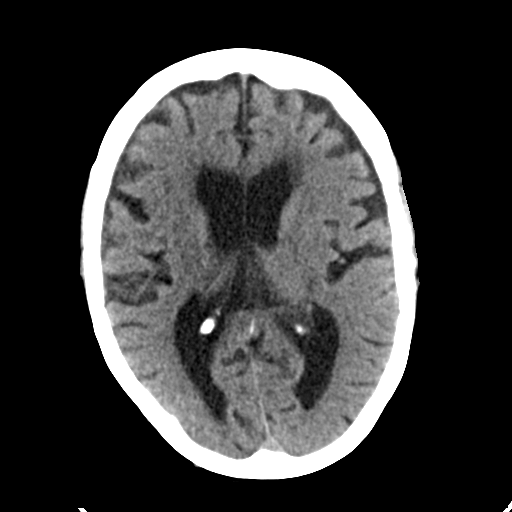
[im 21/34  brain]
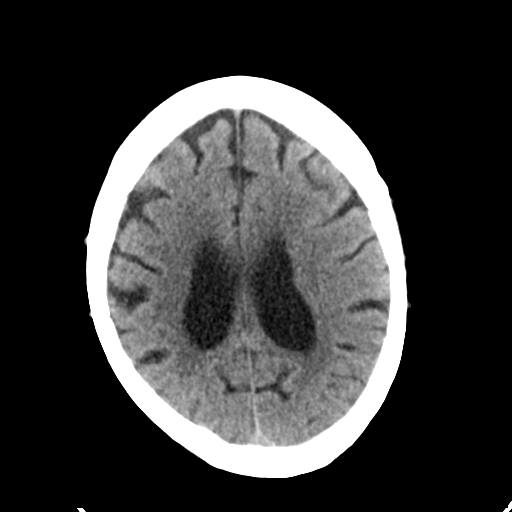
[im 21/34  bone]
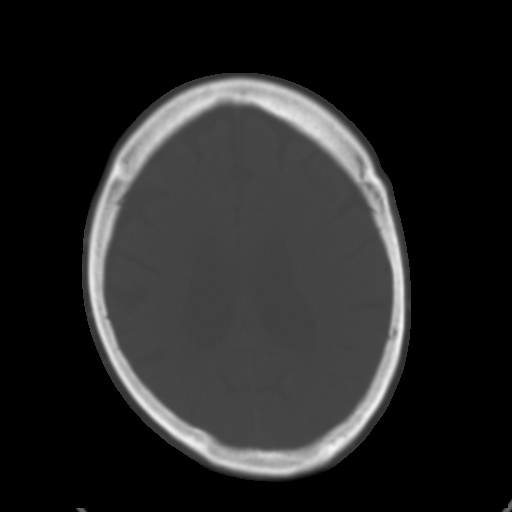
[im 25/34  brain]
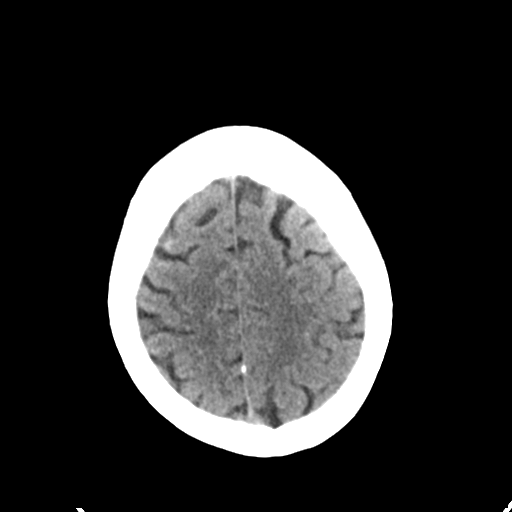
[im 29/34  brain]
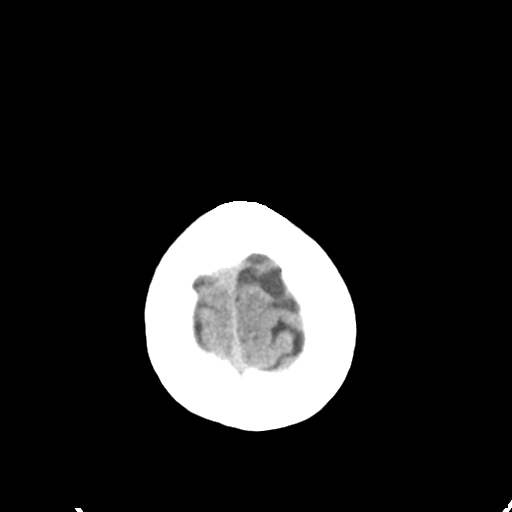

[Series 4: head bone · axial · 0.42mm/px · z∈[-93,-35]mm · 4 of 85 slices shown]
[im 9/85  bone]
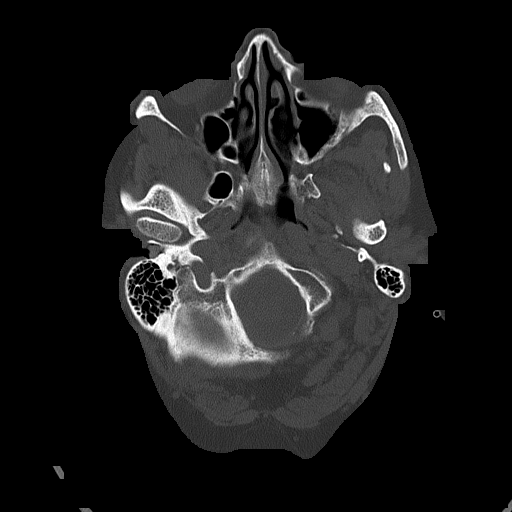
[im 17/85  bone]
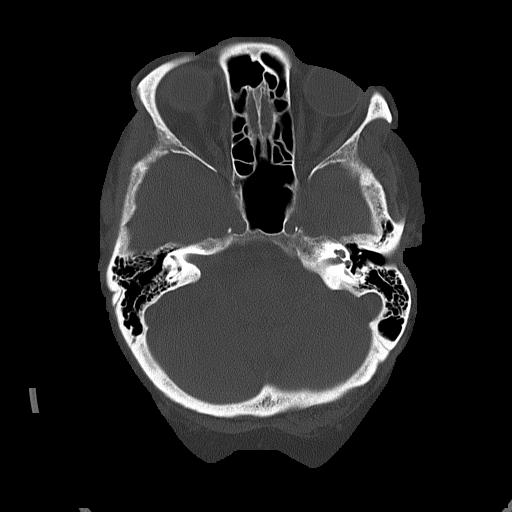
[im 26/85  bone]
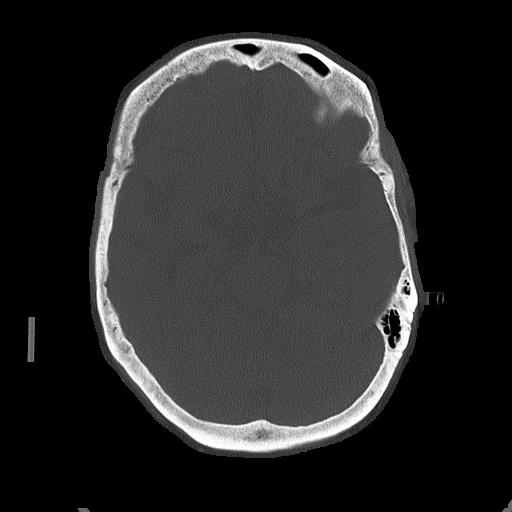
[im 38/85  bone]
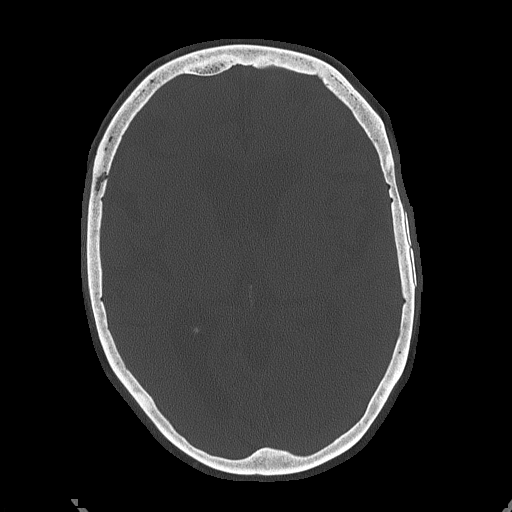

[Series 5: head without cor · coronal · non-contrast · 0.33mm/px · 3 of 70 slices shown]
[im 24/70  brain]
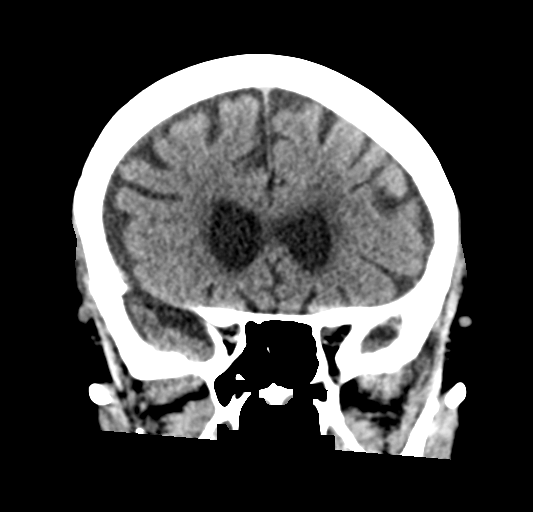
[im 31/70  brain]
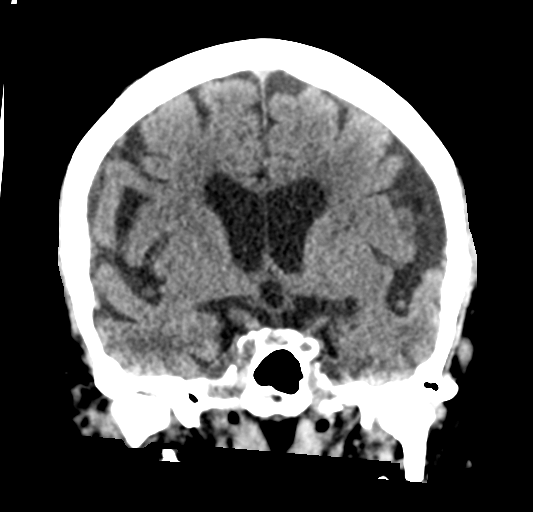
[im 39/70  brain]
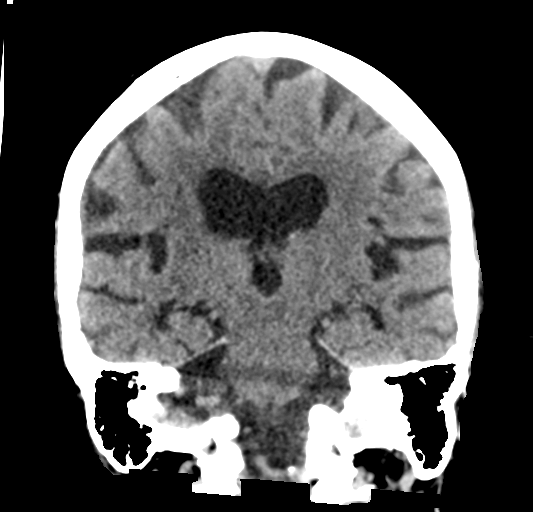

[Series 6: head without sag · sagittal · non-contrast · 0.35mm/px · 3 of 61 slices shown]
[im 21/61  brain]
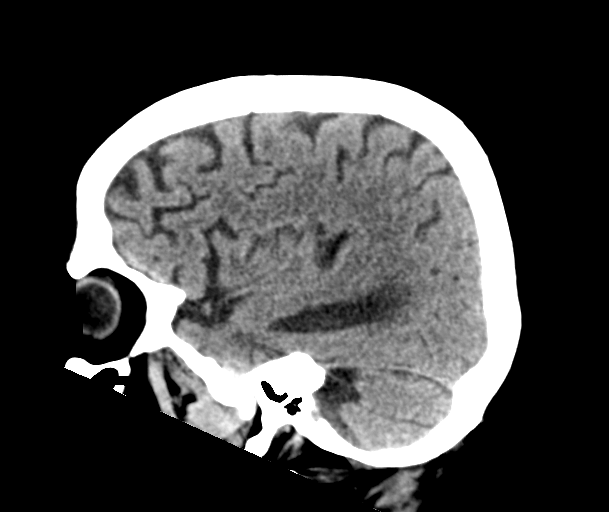
[im 31/61  brain]
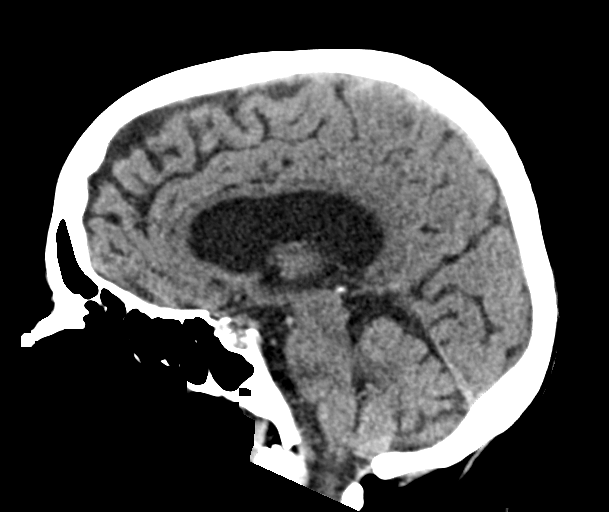
[im 41/61  brain]
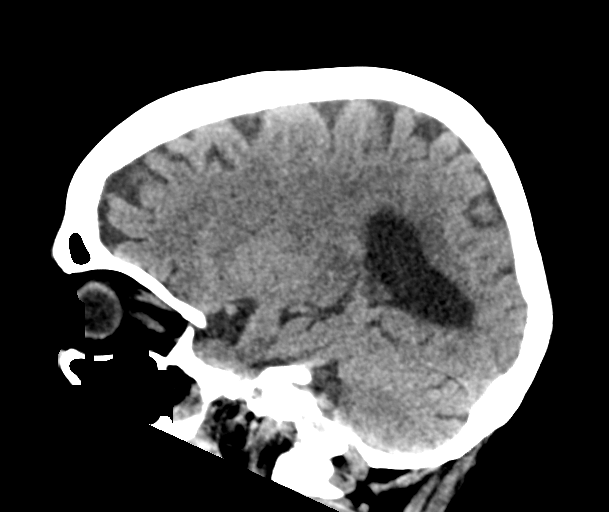

[17 of 47 positions shown; findings below may reference images not displayed]

FINDINGS: Brain: No evidence of acute infarction, hemorrhage, hydrocephalus,
extra-axial collection or mass lesion/mass effect. Moderate brain
parenchymal volume loss and mild deep white matter microangiopathy.

Vascular: Calcific atherosclerotic disease of the intra cavernous
carotid arteries.

Skull: Normal. Negative for fracture or focal lesion.

Sinuses/Orbits: No acute finding.

Other: None.
IMPRESSION: 1. No acute intracranial abnormality.
2. Moderate brain parenchymal atrophy and chronic microvascular
disease.

## 2022-12-12 NOTE — Telephone Encounter (Signed)
Closing encounter
